# Patient Record
Sex: Male | Born: 1955 | Race: White | Hispanic: No | Marital: Married | Smoking: Current every day smoker
Health system: Southern US, Community
[De-identification: ages and names within clinical notes are randomized; demographics above are authoritative.]

## PROBLEM LIST (undated history)

## (undated) DIAGNOSIS — L409 Psoriasis, unspecified: Secondary | ICD-10-CM

## (undated) DIAGNOSIS — E785 Hyperlipidemia, unspecified: Secondary | ICD-10-CM

## (undated) DIAGNOSIS — M5126 Other intervertebral disc displacement, lumbar region: Secondary | ICD-10-CM

## (undated) DIAGNOSIS — K219 Gastro-esophageal reflux disease without esophagitis: Secondary | ICD-10-CM

## (undated) DIAGNOSIS — I10 Essential (primary) hypertension: Secondary | ICD-10-CM

## (undated) DIAGNOSIS — R7301 Impaired fasting glucose: Secondary | ICD-10-CM

## (undated) DIAGNOSIS — M5030 Other cervical disc degeneration, unspecified cervical region: Secondary | ICD-10-CM

## (undated) DIAGNOSIS — T7840XA Allergy, unspecified, initial encounter: Secondary | ICD-10-CM

## (undated) HISTORY — DX: Gastro-esophageal reflux disease without esophagitis: K21.9

## (undated) HISTORY — PX: HERNIA REPAIR: SHX51

## (undated) HISTORY — PX: ACHILLES TENDON REPAIR: SUR1153

## (undated) HISTORY — DX: Essential (primary) hypertension: I10

## (undated) HISTORY — PX: NOSE SURGERY: SHX723

## (undated) HISTORY — DX: Allergy, unspecified, initial encounter: T78.40XA

## (undated) HISTORY — DX: Other cervical disc degeneration, unspecified cervical region: M50.30

## (undated) HISTORY — DX: Other intervertebral disc displacement, lumbar region: M51.26

## (undated) HISTORY — DX: Hyperlipidemia, unspecified: E78.5

## (undated) HISTORY — DX: Impaired fasting glucose: R73.01

## (undated) HISTORY — DX: Psoriasis, unspecified: L40.9

---

## 2000-07-25 ENCOUNTER — Encounter: Admission: RE | Admit: 2000-07-25 | Discharge: 2000-07-25 | Payer: Self-pay | Admitting: Neurosurgery

## 2000-07-25 ENCOUNTER — Encounter: Payer: Self-pay | Admitting: Neurosurgery

## 2000-08-08 ENCOUNTER — Ambulatory Visit (HOSPITAL_COMMUNITY): Admission: RE | Admit: 2000-08-08 | Discharge: 2000-08-08 | Payer: Self-pay | Admitting: Neurosurgery

## 2000-08-08 ENCOUNTER — Encounter: Payer: Self-pay | Admitting: Neurosurgery

## 2002-09-05 ENCOUNTER — Ambulatory Visit (HOSPITAL_BASED_OUTPATIENT_CLINIC_OR_DEPARTMENT_OTHER): Admission: RE | Admit: 2002-09-05 | Discharge: 2002-09-05 | Payer: Self-pay | Admitting: Family Medicine

## 2002-11-13 ENCOUNTER — Encounter: Payer: Self-pay | Admitting: General Surgery

## 2002-11-13 ENCOUNTER — Encounter: Admission: RE | Admit: 2002-11-13 | Discharge: 2002-11-13 | Payer: Self-pay | Admitting: General Surgery

## 2003-06-01 ENCOUNTER — Ambulatory Visit (HOSPITAL_COMMUNITY): Admission: RE | Admit: 2003-06-01 | Discharge: 2003-06-01 | Payer: Self-pay | Admitting: Family Medicine

## 2004-10-13 ENCOUNTER — Other Ambulatory Visit: Admission: RE | Admit: 2004-10-13 | Discharge: 2004-10-13 | Payer: Self-pay | Admitting: Dermatology

## 2005-08-11 ENCOUNTER — Inpatient Hospital Stay (HOSPITAL_COMMUNITY): Admission: RE | Admit: 2005-08-11 | Discharge: 2005-08-14 | Payer: Self-pay | Admitting: Family Medicine

## 2006-06-25 ENCOUNTER — Ambulatory Visit (HOSPITAL_COMMUNITY): Admission: RE | Admit: 2006-06-25 | Discharge: 2006-06-25 | Payer: Self-pay | Admitting: Internal Medicine

## 2006-06-25 ENCOUNTER — Ambulatory Visit: Payer: Self-pay | Admitting: Internal Medicine

## 2006-06-25 HISTORY — PX: COLONOSCOPY: SHX174

## 2013-01-14 ENCOUNTER — Ambulatory Visit (INDEPENDENT_AMBULATORY_CARE_PROVIDER_SITE_OTHER): Payer: 59 | Admitting: Family Medicine

## 2013-01-14 ENCOUNTER — Encounter: Payer: Self-pay | Admitting: Family Medicine

## 2013-01-14 VITALS — BP 130/88 | HR 70 | Wt 170.0 lb

## 2013-01-14 DIAGNOSIS — G5711 Meralgia paresthetica, right lower limb: Secondary | ICD-10-CM

## 2013-01-14 DIAGNOSIS — R300 Dysuria: Secondary | ICD-10-CM

## 2013-01-14 DIAGNOSIS — G5710 Meralgia paresthetica, unspecified lower limb: Secondary | ICD-10-CM

## 2013-01-14 DIAGNOSIS — M549 Dorsalgia, unspecified: Secondary | ICD-10-CM

## 2013-01-14 MED ORDER — ETODOLAC 400 MG PO TABS: 400.0000 mg | ORAL_TABLET | Freq: Two times a day (BID) | ORAL | Status: DC

## 2013-01-14 MED ORDER — CYCLOBENZAPRINE HCL 10 MG PO TABS: 10.0000 mg | ORAL_TABLET | Freq: Three times a day (TID) | ORAL | Status: DC | PRN

## 2013-01-14 NOTE — Patient Instructions (Signed)
Meralgia Paresthetica  Meralgia paresthetica (MP) is a disorder characterized by tingling, numbness, and burning pain in the outer side of the thigh. It occurs in men more than women. MP is generally found in middle-aged or overweight people. Sometimes, the disorder may disappear. CAUSES The disorder is caused by a nerve in the thigh being squeezed (compressed). MP may be associated with tight clothing, pregnancy, diabetes, and being overweight (obese). SYMPTOMS  Tingling, numbness, and burning in the outer thigh.  An area of the skin may be painful and sensitive to the touch. The symptoms often worsen after walking or standing. TREATMENT  Treatment is based on your symptoms and is mainly supportive. Treatment may include:  Wearing looser clothing.  Losing weight.  Avoiding prolonged standing or walking.  Taking medication.  Surgery if the pain is peristent or severe. MP usually eases or disappears after treatment. Surgery is not always fully successful. Document Released: 06/23/2002 Document Revised: 09/25/2011 Document Reviewed: 07/03/2005 [employer] Patient Information 2014 [employer].  

## 2013-01-14 NOTE — Progress Notes (Signed)
  Subjective:    Patient ID: SADRAC ZEOLI, male    DOB: 02/19/1956, 57 y.o.   MRN: [id number]  Back Pain This is a new problem. The current episode started in the past 7 days. The problem has been gradually worsening since onset. The pain is present in the lumbar spine. The quality of the pain is described as aching.   Patient's spouse wants MRI and neurosurgery referral right off the bat Neg change in bowels or urine. Low back horse liniment.  Also notes numbness right anterior leg. On further history he has been putting the and and the shovel into his right groin when taking.  Review of Systems  Musculoskeletal: Positive for back pain.   No change in urine or bowel habits. No fever no abdominal pain. ROS otherwise negative.    Objective:   Physical Exam  Alert significant distress. Vitals reviewed. Lungs clear. Heart regular rate and rhythm. Negative straight leg raise. Right lower back tender to palpation. Right anterior thigh diminished sensation      Assessment & Plan:  In impression #1 acute lumbar strain discussed at great length. #2 meralgia paresthetica discussed at great length. Plan hydrocodone when necessary for pain. Flexeril 10 3 times a day. Lodine 400 twice a day. Followup in one week. Easily 25 minutes spent mostly in discussion. WSL recheck in one week.

## 2013-01-15 DIAGNOSIS — M549 Dorsalgia, unspecified: Secondary | ICD-10-CM | POA: Insufficient documentation

## 2013-01-15 DIAGNOSIS — G5711 Meralgia paresthetica, right lower limb: Secondary | ICD-10-CM | POA: Insufficient documentation

## 2013-01-16 ENCOUNTER — Ambulatory Visit: Payer: Self-pay | Admitting: Family Medicine

## 2013-01-20 ENCOUNTER — Encounter: Payer: Self-pay | Admitting: *Deleted

## 2013-01-22 ENCOUNTER — Encounter: Payer: Self-pay | Admitting: Family Medicine

## 2013-01-22 ENCOUNTER — Ambulatory Visit (INDEPENDENT_AMBULATORY_CARE_PROVIDER_SITE_OTHER): Payer: 59 | Admitting: Family Medicine

## 2013-01-22 VITALS — BP 122/86 | HR 80 | Wt 171.5 lb

## 2013-01-22 DIAGNOSIS — G5710 Meralgia paresthetica, unspecified lower limb: Secondary | ICD-10-CM

## 2013-01-22 DIAGNOSIS — G5711 Meralgia paresthetica, right lower limb: Secondary | ICD-10-CM

## 2013-01-22 DIAGNOSIS — M549 Dorsalgia, unspecified: Secondary | ICD-10-CM

## 2013-01-22 MED ORDER — ETODOLAC 400 MG PO TABS: 400.0000 mg | ORAL_TABLET | Freq: Two times a day (BID) | ORAL | Status: DC

## 2013-01-22 NOTE — Progress Notes (Signed)
  Subjective:    Patient ID: Billy Mccormick, male    DOB: Mar 14, 1956, 57 y.o.   MRN: [id number]  Back Pain This is a new problem. The current episode started in the past 7 days. The problem occurs 2 to 4 times per day. The problem has been resolved since onset. The pain is present in the lumbar spine. The quality of the pain is described as shooting. The pain radiates to the left knee. The pain is at a severity of 5/10. The pain is moderate. The pain is worse during the day. The symptoms are aggravated by bending. Stiffness is present in the morning. Pertinent negatives include no abdominal pain.   patient presents for an extensive discussion regarding his slow but definite recovery. Right posterior back pain is much improved. Right groin pain is somewhat improved. Right chronic spermatic cord tenderness post hernia surgery persists. Right anterior thigh numbness persists. And now numbness feels "deeper" patient has questionable weakness in the right leg but on further history he admits that could well be limitation due to pain.    Review of Systems  Gastrointestinal: Negative for abdominal pain.  Musculoskeletal: Positive for back pain.   ROS otherwise negative    Objective:   Physical Exam   alert no acute distress. Lungs clear. Heart regular in rhythm. Spine nontender. Right posterior lumbar tenderness much improved. Right groin tenderness persists though improved. No hernia. Anterior thigh not. Strength sensation deep tendon reflexes elsewhere all within normal limits      Assessment & Plan:  Impression 1 back strain. #2 neuralgia paresthetica. #3 groin inflammation. #4 chronic pain post hernia surgery. Plan maintain all meds. Expect slow resolution. Many many questions answered. 25 minutes spent most in discussion. WSL

## 2013-07-17 DIAGNOSIS — M5126 Other intervertebral disc displacement, lumbar region: Secondary | ICD-10-CM

## 2013-07-17 HISTORY — DX: Other intervertebral disc displacement, lumbar region: M51.26

## 2013-10-13 ENCOUNTER — Encounter: Payer: Self-pay | Admitting: Family Medicine

## 2013-10-13 ENCOUNTER — Ambulatory Visit (INDEPENDENT_AMBULATORY_CARE_PROVIDER_SITE_OTHER): Payer: 59 | Admitting: Family Medicine

## 2013-10-13 VITALS — BP 120/90 | Temp 98.4°F | Ht 73.0 in | Wt 184.0 lb

## 2013-10-13 DIAGNOSIS — R1310 Dysphagia, unspecified: Secondary | ICD-10-CM

## 2013-10-13 MED ORDER — PANTOPRAZOLE SODIUM 40 MG PO TBEC: 40.0000 mg | DELAYED_RELEASE_TABLET | Freq: Every day | ORAL | Status: DC

## 2013-10-13 NOTE — Progress Notes (Signed)
   Subjective:    Patient ID: Judie Petit, male    DOB: 1956-01-26, 57 y.o.   MRN: [id number]  Sore Throat  This is a new problem. The current episode started more than 1 year ago. The problem has been gradually worsening. Neither side of throat is experiencing more pain than the other. There has been no fever. The pain is at a severity of 8/10. The pain is moderate. Associated symptoms include trouble swallowing. He has tried nothing for the symptoms. The treatment provided no relief.  Patient states he has no other concerns at this time.   Pt notes a sens mid chexst worsened by "temp and texture and volume""  Worse the past few months, took famotidine Helped felt better off and on and then stopped  Off now past ten days  Some burping and belching, Food not getting stuck, no vomiting, occas ibupofen  Has cut out v8    Cut down to three quraters to one ppd,[phone]  Pain does not awaken  Not with exercise, question cold air not dsure  Review of Systems  HENT: Positive for trouble swallowing.    No headache no chest pain no back pain no change in bowel habits no blood in stool ROS otherwise negative    Objective:   Physical Exam Alert no acute distress. Lungs clear. Heart regular in rhythm. HEENT normal pharynx normal neck supple abdomen benign.       Assessment & Plan:  Impression probable flare with reflux however an element of his history is consistent dysphagia. Therefore GI referral warranted. Discussed. Plan initiate proton pump inhibitor. GI referral.

## 2013-10-16 ENCOUNTER — Encounter: Payer: Self-pay | Admitting: Internal Medicine

## 2013-11-11 ENCOUNTER — Ambulatory Visit (INDEPENDENT_AMBULATORY_CARE_PROVIDER_SITE_OTHER): Payer: 59 | Admitting: Gastroenterology

## 2013-11-11 ENCOUNTER — Encounter: Payer: Self-pay | Admitting: Gastroenterology

## 2013-11-11 ENCOUNTER — Other Ambulatory Visit: Payer: Self-pay | Admitting: Internal Medicine

## 2013-11-11 VITALS — BP 140/86 | HR 67 | Temp 98.4°F | Ht 73.0 in | Wt 180.0 lb

## 2013-11-11 DIAGNOSIS — R1310 Dysphagia, unspecified: Secondary | ICD-10-CM

## 2013-11-11 DIAGNOSIS — R1013 Epigastric pain: Secondary | ICD-10-CM

## 2013-11-11 DIAGNOSIS — R1319 Other dysphagia: Secondary | ICD-10-CM | POA: Insufficient documentation

## 2013-11-11 DIAGNOSIS — R1314 Dysphagia, pharyngoesophageal phase: Secondary | ICD-10-CM

## 2013-11-11 NOTE — Progress Notes (Signed)
Primary Care Physician:  Rubbie Battiest, MD  Primary Gastroenterologist:  Garfield Cornea, MD   Chief Complaint  Patient presents with  . Dysphagia    HPI:  Billy Mccormick is a 58 y.o. male here for further evaluation of esophageal dysphagia. Patient reports symptoms began about 2 years ago but was intermittent initially. Symptoms have progressively worsened and become severe back in January. Saw PCP and was started on pantoprazole about a month ago. Complains of food getting lodged the epigastric area and associated with pain. Happened regularly with large boluses, rough foods, hot foods. Symptoms have improved with decreased cigarette smoking, cutting back on caffeine and carbonated beverages, addition of PPI. Never really had typical heartburn symptoms. No prior EGD.  BM normal. No black, brbpr.   Current Outpatient Prescriptions  Medication Sig Dispense Refill  . pantoprazole (PROTONIX) 40 MG tablet Take 1 tablet (40 mg total) by mouth daily.  30 tablet  5   No current facility-administered medications for this visit.    Allergies as of 11/11/2013 - Review Complete 11/11/2013  Allergen Reaction Noted  . Prednisone  01/14/2013    Past Medical History  Diagnosis Date  . Psoriasis   . Degenerative cervical disc   . Hypertension   . Hyperlipidemia   . IFG (impaired fasting glucose)     Past Surgical History  Procedure Laterality Date  . Nose surgery      x 2  . Hernia repair      x 2  . Colonoscopy  06/25/2006    Dr. Gala Romney: extensive left-sided diverticula, 2 six to seven mm pale-appearing hyperplastic-appearing polypoid lesions vs atypcial appearing lipoma. (bx-hyperplastic polyps). next TCS 06/2016  . Achilles tendon repair      right    Family History  Problem Relation Age of Onset  . Colon cancer Neg Hx     History   Social History  . Marital Status: Married    Spouse Name: N/A    Number of Children: 0  . Years of Education: N/A   Occupational History  . Not  on file.   Social History Main Topics  . Smoking status: Current Every Day Smoker  . Smokeless tobacco: Not on file  . Alcohol Use: Yes     Comment: 6 beers about 3 days weekly  . Drug Use: No  . Sexual Activity: Not on file   Other Topics Concern  . Not on file   Social History Narrative  . No narrative on file      ROS:  General: Negative for anorexia, weight loss, fever, chills, fatigue, weakness. Eyes: Negative for vision changes.  ENT: Negative for hoarseness, difficulty swallowing , nasal congestion. CV: Negative for chest pain, angina, palpitations, dyspnea on exertion, peripheral edema.  Respiratory: Negative for dyspnea at rest, dyspnea on exertion, cough, sputum, wheezing.  GI: See history of present illness. GU:  Negative for dysuria, hematuria, urinary incontinence, urinary frequency, nocturnal urination.  MS: Negative for joint pain, low back pain.  Derm: Negative for rash or itching.  Neuro: Negative for weakness, abnormal sensation, seizure, frequent headaches, memory loss, confusion.  Psych: Negative for anxiety, depression, suicidal ideation, hallucinations.  Endo: Negative for unusual weight change.  Heme: Negative for bruising or bleeding. Allergy: Negative for rash or hives.    Physical Examination:  BP 140/86  Pulse 67  Temp(Src) 98.4 F (36.9 C) (Oral)  Ht 6\' 1"  (1.854 m)  Wt 180 lb (81.647 kg)  BMI 23.75 kg/m2  General: Well-nourished, well-developed in no acute distress.  Head: Normocephalic, atraumatic.   Eyes: Conjunctiva pink, no icterus. Mouth: Oropharyngeal mucosa moist and pink , no lesions erythema or exudate. Neck: Supple without thyromegaly, masses, or lymphadenopathy.  Lungs: Clear to auscultation bilaterally.  Heart: Regular rate and rhythm, no murmurs rubs or gallops.  Abdomen: Bowel sounds are normal, nontender, nondistended, no hepatosplenomegaly or masses, no abdominal bruits or    hernia , no rebound or guarding.   Rectal:  Not performed Extremities: No lower extremity edema. No clubbing or deformities.  Neuro: Alert and oriented x 4 , grossly normal neurologically.  Skin: Warm and dry, no rash or jaundice.   Psych: Alert and cooperative, normal mood and affect.

## 2013-11-11 NOTE — Assessment & Plan Note (Signed)
58 year old gentleman, chronic smoker and moderate alcohol consumption, who presents with two-year history of intermittent but progressive esophageal dysphagia. Symptoms associated with epigastric discomfort when this occurs. Symptoms have improved with recent PPI therapy. Differential diagnosis includes esophageal stricture, reflux esophagitis, malignancy. We have offered an upper endoscopy with esophageal dilation with Dr. Gala Romney in the near future. Given moderate alcohol consumption, will augment conscious sedation with Phenergan 25 mg IV 30 minutes before the procedure.  I have discussed the risks, alternatives, benefits with regards to but not limited to the risk of reaction to medication, bleeding, infection, perforation and the patient is agreeable to proceed. Written consent to be obtained.  Continue PPI therapy for now.

## 2013-11-11 NOTE — Patient Instructions (Signed)
1. Upper endoscopy with Dr. Rourk as scheduled. Please see separate instructions. 

## 2013-11-11 NOTE — Progress Notes (Signed)
Cc'd to pcp 

## 2013-11-14 ENCOUNTER — Encounter (HOSPITAL_COMMUNITY): Payer: Self-pay | Admitting: Pharmacy Technician

## 2013-11-28 ENCOUNTER — Ambulatory Visit (HOSPITAL_COMMUNITY)
Admission: RE | Admit: 2013-11-28 | Discharge: 2013-11-28 | Disposition: A | Discharge status: Home / Self Care | Payer: 59 | Source: Ambulatory Visit | Attending: Internal Medicine | Admitting: Internal Medicine

## 2013-11-28 ENCOUNTER — Encounter (HOSPITAL_COMMUNITY): Payer: Self-pay | Admitting: *Deleted

## 2013-11-28 ENCOUNTER — Encounter (HOSPITAL_COMMUNITY)
Admission: RE | Disposition: A | Discharge status: Home / Self Care | Payer: Self-pay | Source: Ambulatory Visit | Attending: Internal Medicine

## 2013-11-28 DIAGNOSIS — Z888 Allergy status to other drugs, medicaments and biological substances status: Secondary | ICD-10-CM | POA: Insufficient documentation

## 2013-11-28 DIAGNOSIS — R1310 Dysphagia, unspecified: Secondary | ICD-10-CM

## 2013-11-28 DIAGNOSIS — Q391 Atresia of esophagus with tracheo-esophageal fistula: Secondary | ICD-10-CM

## 2013-11-28 DIAGNOSIS — R1319 Other dysphagia: Secondary | ICD-10-CM

## 2013-11-28 DIAGNOSIS — R1013 Epigastric pain: Secondary | ICD-10-CM

## 2013-11-28 DIAGNOSIS — L408 Other psoriasis: Secondary | ICD-10-CM | POA: Insufficient documentation

## 2013-11-28 DIAGNOSIS — R1314 Dysphagia, pharyngoesophageal phase: Secondary | ICD-10-CM

## 2013-11-28 DIAGNOSIS — Q393 Congenital stenosis and stricture of esophagus: Secondary | ICD-10-CM

## 2013-11-28 DIAGNOSIS — K449 Diaphragmatic hernia without obstruction or gangrene: Secondary | ICD-10-CM | POA: Insufficient documentation

## 2013-11-28 DIAGNOSIS — I10 Essential (primary) hypertension: Secondary | ICD-10-CM | POA: Insufficient documentation

## 2013-11-28 DIAGNOSIS — E785 Hyperlipidemia, unspecified: Secondary | ICD-10-CM | POA: Insufficient documentation

## 2013-11-28 DIAGNOSIS — K259 Gastric ulcer, unspecified as acute or chronic, without hemorrhage or perforation: Secondary | ICD-10-CM | POA: Insufficient documentation

## 2013-11-28 DIAGNOSIS — Z79899 Other long term (current) drug therapy: Secondary | ICD-10-CM | POA: Insufficient documentation

## 2013-11-28 DIAGNOSIS — M5030 Other cervical disc degeneration, unspecified cervical region: Secondary | ICD-10-CM | POA: Insufficient documentation

## 2013-11-28 DIAGNOSIS — F17200 Nicotine dependence, unspecified, uncomplicated: Secondary | ICD-10-CM | POA: Insufficient documentation

## 2013-11-28 DIAGNOSIS — K222 Esophageal obstruction: Secondary | ICD-10-CM

## 2013-11-28 HISTORY — PX: SAVORY DILATION: SHX5439

## 2013-11-28 HISTORY — PX: MALONEY DILATION: SHX5535

## 2013-11-28 HISTORY — PX: ESOPHAGOGASTRODUODENOSCOPY: SHX5428

## 2013-11-28 SURGERY — EGD (ESOPHAGOGASTRODUODENOSCOPY)
Anesthesia: Moderate Sedation

## 2013-11-28 MED ORDER — SODIUM CHLORIDE 0.9 % IJ SOLN
INTRAMUSCULAR | Status: AC
  Filled 2013-11-28: qty 10

## 2013-11-28 MED ORDER — LIDOCAINE VISCOUS 2 % MT SOLN
OROMUCOSAL | Status: DC | PRN
  Administered 2013-11-28: 4 mL via OROMUCOSAL

## 2013-11-28 MED ORDER — MIDAZOLAM HCL 5 MG/5ML IJ SOLN
INTRAMUSCULAR | Status: AC
  Filled 2013-11-28: qty 10

## 2013-11-28 MED ORDER — MEPERIDINE HCL 100 MG/ML IJ SOLN
INTRAMUSCULAR | Status: AC
  Filled 2013-11-28: qty 2

## 2013-11-28 MED ORDER — ONDANSETRON HCL 4 MG/2ML IJ SOLN
INTRAMUSCULAR | Status: DC | PRN
  Administered 2013-11-28: 4 mg via INTRAVENOUS

## 2013-11-28 MED ORDER — MEPERIDINE HCL 100 MG/ML IJ SOLN
INTRAMUSCULAR | Status: DC | PRN
  Administered 2013-11-28: 50 mg

## 2013-11-28 MED ORDER — ONDANSETRON HCL 4 MG/2ML IJ SOLN
INTRAMUSCULAR | Status: AC
  Filled 2013-11-28: qty 2

## 2013-11-28 MED ORDER — SODIUM CHLORIDE 0.9 % IV SOLN
INTRAVENOUS | Status: DC
  Administered 2013-11-28: 09:00:00 via INTRAVENOUS

## 2013-11-28 MED ORDER — MIDAZOLAM HCL 5 MG/5ML IJ SOLN
INTRAMUSCULAR | Status: DC | PRN
  Administered 2013-11-28: 1 mg via INTRAVENOUS
  Administered 2013-11-28: 2 mg via INTRAVENOUS

## 2013-11-28 MED ORDER — LIDOCAINE VISCOUS 2 % MT SOLN
OROMUCOSAL | Status: AC
  Filled 2013-11-28: qty 15

## 2013-11-28 MED ORDER — PROMETHAZINE HCL 25 MG/ML IJ SOLN
INTRAMUSCULAR | Status: AC
  Filled 2013-11-28: qty 1

## 2013-11-28 MED ORDER — PROMETHAZINE HCL 25 MG/ML IJ SOLN
25.0000 mg | Freq: Once | INTRAMUSCULAR | Status: AC
  Administered 2013-11-28: 25 mg via INTRAVENOUS

## 2013-11-28 MED ORDER — SIMETHICONE 40 MG/0.6ML PO SUSP
ORAL | Status: DC | PRN
  Administered 2013-11-28: 10:00:00

## 2013-11-28 NOTE — Op Note (Signed)
[facility] [address] [place], [address]   ENDOSCOPY PROCEDURE REPORT  PATIENT: Billy Mccormick, Billy Mccormick  MR#: [id number] BIRTHDATE: 10-11-1955 , 59  yrs. old GENDER: Male ENDOSCOPIST: R.  Garfield Cornea, MD FACP FACG REFERRED BY:  Rosemary Holms, M.D. PROCEDURE DATE:  11/28/2013 PROCEDURE:     EGD with gastric biopsy  INDICATIONS:     Recent lower chest pain with swallowing/odynophagia. Discussed symptoms in  preop with patient and wife. He denies true esophageal dysphagia. These symptoms have completely resolved on Protonix 40 mg daily.  INFORMED CONSENT:   The risks, benefits, limitations, alternatives and imponderables have been discussed.  The potential for biopsy, esophogeal dilation, etc. have also been reviewed.  Questions have been answered.  All parties agreeable.  Please see the history and physical in the medical record for more information.  MEDICATIONS:     Versed 3 mg IV and Demerol 50 mg IV in divided doses. Phenergan 25 mg IV. Zofran 4 mg IV. Xylocaine gel orally.  DESCRIPTION OF PROCEDURE:   The EG-[id number] ([id number])  endoscope was introduced through the mouth and advanced to the second portion of the duodenum without difficulty or limitations.  The mucosal surfaces were surveyed very carefully during advancement of the scope and upon withdrawal.  Retroflexion view of the proximal stomach and esophagogastric junction was performed.      FINDINGS: Non-critical Schatzki's ring; otherwise normal esophagus. Stomach empty. Smalll hiatal hernia. (1)  4 mm ulcer on the angularis; multiple areas of antral erosion with some minimally heaped up mucosa with central eroded mucosa. No obvious infiltrating process Mr. Patent pylorus. Normal first and second portion of the duodenum.  THERAPEUTIC / DIAGNOSTIC MANEUVERS PERFORMED:  Biopsies of the angularis ulcer and antral erosions were taken separately. No dilation performed because no  dysphagia.   COMPLICATIONS:  None  IMPRESSION:  Noncritical Schatzki's ring  -  not manipulated. Small hiatal hernia. Gastric ulcer and erosions as described above   - status post biopsy  RECOMMENDATIONS:  Increase Prtonix to 40 mg twice daily. minimize use of alcohol and tobacco. Avoid nonsteroidal agents. Further recommendations to follow.    _______________________________ R. Garfield Cornea, MD FACP [facility] eSigned:  R. Garfield Cornea, MD FACP [facility] 11/28/2013 10:23 AM     CC:  PATIENT NAME:  Billy Mccormick, Billy Mccormick MR#: [id number]

## 2013-11-28 NOTE — Interval H&P Note (Signed)
History and Physical Interval Note:  11/28/2013 9:57 AM  Billy Mccormick  has presented today for surgery, with the diagnosis of ESOPHAGEAL DYSPHAGIA AND EPIGASTRIC PAIN  The various methods of treatment have been discussed with the patient and family. After consideration of risks, benefits and other options for treatment, the patient has consented to  Procedure(s) with comments: ESOPHAGOGASTRODUODENOSCOPY (EGD) (N/A) - 9:30 SAVORY DILATION (N/A) MALONEY DILATION (N/A) as a surgical intervention .  The patient's history has been reviewed, patient examined, no change in status, stable for surgery.  I have reviewed the patient's chart and labs.  Questions were answered to the patient's satisfaction.     Cristopher Estimable Chalmers Iddings  His upper GI tract symptoms have resolved on once daily Protonix; EGD per plan;  The risks, benefits, limitations, alternatives and imponderables have been reviewed with the patient. Potential for esophageal dilation, biopsy, etc. have also been reviewed.  Questions have been answered. All parties agreeable.

## 2013-11-28 NOTE — H&P (View-Only) (Signed)
Primary Care Physician:  Rubbie Battiest, MD  Primary Gastroenterologist:  Garfield Cornea, MD   Chief Complaint  Patient presents with  . Dysphagia    HPI:  Billy Mccormick is a 58 y.o. male here for further evaluation of esophageal dysphagia. Patient reports symptoms began about 2 years ago but was intermittent initially. Symptoms have progressively worsened and become severe back in January. Saw PCP and was started on pantoprazole about a month ago. Complains of food getting lodged the epigastric area and associated with pain. Happened regularly with large boluses, rough foods, hot foods. Symptoms have improved with decreased cigarette smoking, cutting back on caffeine and carbonated beverages, addition of PPI. Never really had typical heartburn symptoms. No prior EGD.  BM normal. No black, brbpr.   Current Outpatient Prescriptions  Medication Sig Dispense Refill  . pantoprazole (PROTONIX) 40 MG tablet Take 1 tablet (40 mg total) by mouth daily.  30 tablet  5   No current facility-administered medications for this visit.    Allergies as of 11/11/2013 - Review Complete 11/11/2013  Allergen Reaction Noted  . Prednisone  01/14/2013    Past Medical History  Diagnosis Date  . Psoriasis   . Degenerative cervical disc   . Hypertension   . Hyperlipidemia   . IFG (impaired fasting glucose)     Past Surgical History  Procedure Laterality Date  . Nose surgery      x 2  . Hernia repair      x 2  . Colonoscopy  06/25/2006    Dr. Gala Romney: extensive left-sided diverticula, 2 six to seven mm pale-appearing hyperplastic-appearing polypoid lesions vs atypcial appearing lipoma. (bx-hyperplastic polyps). next TCS 06/2016  . Achilles tendon repair      right    Family History  Problem Relation Age of Onset  . Colon cancer Neg Hx     History   Social History  . Marital Status: Married    Spouse Name: N/A    Number of Children: 0  . Years of Education: N/A   Occupational History  . Not  on file.   Social History Main Topics  . Smoking status: Current Every Day Smoker  . Smokeless tobacco: Not on file  . Alcohol Use: Yes     Comment: 6 beers about 3 days weekly  . Drug Use: No  . Sexual Activity: Not on file   Other Topics Concern  . Not on file   Social History Narrative  . No narrative on file      ROS:  General: Negative for anorexia, weight loss, fever, chills, fatigue, weakness. Eyes: Negative for vision changes.  ENT: Negative for hoarseness, difficulty swallowing , nasal congestion. CV: Negative for chest pain, angina, palpitations, dyspnea on exertion, peripheral edema.  Respiratory: Negative for dyspnea at rest, dyspnea on exertion, cough, sputum, wheezing.  GI: See history of present illness. GU:  Negative for dysuria, hematuria, urinary incontinence, urinary frequency, nocturnal urination.  MS: Negative for joint pain, low back pain.  Derm: Negative for rash or itching.  Neuro: Negative for weakness, abnormal sensation, seizure, frequent headaches, memory loss, confusion.  Psych: Negative for anxiety, depression, suicidal ideation, hallucinations.  Endo: Negative for unusual weight change.  Heme: Negative for bruising or bleeding. Allergy: Negative for rash or hives.    Physical Examination:  BP 140/86  Pulse 67  Temp(Src) 98.4 F (36.9 C) (Oral)  Ht 6\' 1"  (1.854 m)  Wt 180 lb (81.647 kg)  BMI 23.75 kg/m2  General: Well-nourished, well-developed in no acute distress.  Head: Normocephalic, atraumatic.   Eyes: Conjunctiva pink, no icterus. Mouth: Oropharyngeal mucosa moist and pink , no lesions erythema or exudate. Neck: Supple without thyromegaly, masses, or lymphadenopathy.  Lungs: Clear to auscultation bilaterally.  Heart: Regular rate and rhythm, no murmurs rubs or gallops.  Abdomen: Bowel sounds are normal, nontender, nondistended, no hepatosplenomegaly or masses, no abdominal bruits or    hernia , no rebound or guarding.   Rectal:  Not performed Extremities: No lower extremity edema. No clubbing or deformities.  Neuro: Alert and oriented x 4 , grossly normal neurologically.  Skin: Warm and dry, no rash or jaundice.   Psych: Alert and cooperative, normal mood and affect.

## 2013-11-28 NOTE — Discharge Instructions (Addendum)
EGD Discharge instructions Please read the instructions outlined below and refer to this sheet in the next few weeks. These discharge instructions provide you with general information on caring for yourself after you leave the hospital. Your doctor may also give you specific instructions. While your treatment has been planned according to the most current medical practices available, unavoidable complications occasionally occur. If you have any problems or questions after discharge, please call your doctor. ACTIVITY  You may resume your regular activity but move at a slower pace for the next 24 hours.   Take frequent rest periods for the next 24 hours.   Walking will help expel (get rid of) the air and reduce the bloated feeling in your abdomen.   No driving for 24 hours (because of the anesthesia (medicine) used during the test).   You may shower.   Do not sign any important legal documents or operate any machinery for 24 hours (because of the anesthesia used during the test).  NUTRITION  Drink plenty of fluids.   You may resume your normal diet.   Begin with a light meal and progress to your normal diet.   Avoid alcoholic beverages for 24 hours or as instructed by your caregiver.  MEDICATIONS  You may resume your normal medications unless your caregiver tells you otherwise.  WHAT YOU CAN EXPECT TODAY  You may experience abdominal discomfort such as a feeling of fullness or gas pains.  FOLLOW-UP  Your doctor will discuss the results of your test with you.  SEEK IMMEDIATE MEDICAL ATTENTION IF ANY OF THE FOLLOWING OCCUR:  Excessive nausea (feeling sick to your stomach) and/or vomiting.   Severe abdominal pain and distention (swelling).   Trouble swallowing.   Temperature over 101 F (37.8 C).    Increase Protonix to 40 mg twice daily  Avoid nonsteroidal agents like ibuprofen and Motrin  Minimize use of alcohol and tobacco  Office visit with [place] in 3  months  Further recommendations to follow pending review of pathology report  Rectal bleeding or vomiting of blood.   Peptic Ulcer A peptic ulcer is a sore in the lining of in your esophagus (esophageal ulcer), stomach (gastric ulcer), or in the first part of your small intestine (duodenal ulcer). The ulcer causes erosion into the deeper tissue. CAUSES  Normally, the lining of the stomach and the small intestine protects itself from the acid that digests food. The protective lining can be damaged by:  An infection caused by a bacterium called Helicobacter pylori (H. pylori).  Regular use of nonsteroidal anti-inflammatory drugs (NSAIDs), such as ibuprofen or aspirin.  Smoking tobacco. Other risk factors include being older than 25, drinking alcohol excessively, and having a family history of ulcer disease.  SYMPTOMS   Burning pain or gnawing in the area between the chest and the belly button.  Heartburn.  Nausea and vomiting.  Bloating. The pain can be worse on an empty stomach and at night. If the ulcer results in bleeding, it can cause:  Black, tarry stools.  Vomiting of bright red blood.  Vomiting of coffee ground looking materials. DIAGNOSIS  A diagnosis is usually made based upon your history and an exam. Other tests and procedures may be performed to find the cause of the ulcer. Finding a cause will help determine the best treatment. Tests and procedures may include:  Blood tests, stool tests, or breath tests to check for the bacterium H. pylori.  An upper gastrointestinal (GI) series of the esophagus, stomach, and  small intestine.  An endoscopy to examine the esophagus, stomach, and small intestine.  A biopsy. TREATMENT  Treatment may include:  Eliminating the cause of the ulcer, such as smoking, NSAIDs, or alcohol.  Medicines to reduce the amount of acid in your digestive tract.  Antibiotic medicines if the ulcer is caused by the H. pylori bacterium.  An  upper endoscopy to treat a bleeding ulcer.  Surgery if the bleeding is severe or if the ulcer created a hole somewhere in the digestive system. HOME CARE INSTRUCTIONS   Avoid tobacco, alcohol, and caffeine. Smoking can increase the acid in the stomach, and continued smoking will impair the healing of ulcers.  Avoid foods and drinks that seem to cause discomfort or aggravate your ulcer.  Only take medicines as directed by your caregiver. Do not substitute over-the-counter medicines for prescription medicines without talking to your caregiver.  Keep any follow-up appointments and tests as directed. SEEK MEDICAL CARE IF:   Your do not improve within 7 days of starting treatment.  You have ongoing indigestion or heartburn. SEEK IMMEDIATE MEDICAL CARE IF:   You have sudden, sharp, or persistent abdominal pain.  You have bloody or dark black, tarry stools.  You vomit blood or vomit that looks like coffee grounds.  You become light headed, weak, or feel faint.  You become sweaty or clammy. MAKE SURE YOU:   Understand these instructions.  Will watch your condition.  Will get help right away if you are not doing well or get worse.

## 2013-12-01 ENCOUNTER — Encounter (HOSPITAL_COMMUNITY): Payer: Self-pay | Admitting: Internal Medicine

## 2013-12-07 ENCOUNTER — Encounter: Payer: Self-pay | Admitting: Internal Medicine

## 2013-12-09 ENCOUNTER — Telehealth: Payer: Self-pay

## 2013-12-09 NOTE — Telephone Encounter (Signed)
Letter from: Daneil Dolin  Reason for Letter: Results Review  Send letter to patient.  Send copy of letter with path to referring provider and PCP.  Needs ov w extender in 3 months - will need a repeat  EGD

## 2013-12-09 NOTE — Telephone Encounter (Signed)
Letter mailed to pt.  

## 2013-12-10 ENCOUNTER — Other Ambulatory Visit: Payer: Self-pay | Admitting: *Deleted

## 2013-12-10 NOTE — Telephone Encounter (Signed)
Results Cc to PCP  

## 2013-12-10 NOTE — Telephone Encounter (Signed)
Reminder in epic °

## 2014-03-30 ENCOUNTER — Encounter: Payer: Self-pay | Admitting: Internal Medicine

## 2014-04-17 ENCOUNTER — Ambulatory Visit: Payer: 59 | Admitting: Gastroenterology

## 2014-04-27 ENCOUNTER — Encounter: Payer: Self-pay | Admitting: Gastroenterology

## 2014-04-27 ENCOUNTER — Telehealth: Payer: Self-pay

## 2014-04-27 ENCOUNTER — Other Ambulatory Visit: Payer: Self-pay

## 2014-04-27 ENCOUNTER — Ambulatory Visit (INDEPENDENT_AMBULATORY_CARE_PROVIDER_SITE_OTHER): Payer: 59 | Admitting: Gastroenterology

## 2014-04-27 VITALS — BP 127/86 | HR 70 | Temp 98.1°F | Ht 73.0 in | Wt 179.2 lb

## 2014-04-27 DIAGNOSIS — K259 Gastric ulcer, unspecified as acute or chronic, without hemorrhage or perforation: Secondary | ICD-10-CM

## 2014-04-27 DIAGNOSIS — K219 Gastro-esophageal reflux disease without esophagitis: Secondary | ICD-10-CM

## 2014-04-27 DIAGNOSIS — Z8719 Personal history of other diseases of the digestive system: Secondary | ICD-10-CM

## 2014-04-27 DIAGNOSIS — Z8711 Personal history of peptic ulcer disease: Secondary | ICD-10-CM

## 2014-04-27 MED ORDER — PANTOPRAZOLE SODIUM 40 MG PO TBEC: 40.0000 mg | DELAYED_RELEASE_TABLET | Freq: Two times a day (BID) | ORAL | Status: DC

## 2014-04-27 NOTE — Progress Notes (Signed)
Primary Care Physician: Rubbie Battiest, MD  Primary Gastroenterologist:  Garfield Cornea, MD   Chief Complaint  Patient presents with  . Follow-up    HPI: Billy Mccormick is a 58 y.o. male here for followup of EGD. He was seen back in April with complaints of painful swallowing related to large boluses, rough foods, hot foods. Notes that symptoms have decreased with cutting back on caffeine and carbonated beverages, addition of pantoprazole, decrease cigarette smoking. Never really had typical heartburn symptoms. On EGD he had a noncritical Schatzki ring which was not manipulated. 4 mm gastric ulcer and other multiple antral erosions were noted. Antral erosion biopsy showed extensive intestinal metaplasia. Patient's pantoprazole was increased to twice a day. He states she's been completely asymptomatic with regards to odynophagia. Denies any dysphagia. 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 Denies any blood in the stool or melena. Has a bowel movement almost every day. Denies any abdominal pain, vomiting, unintentional weight loss.  Current Outpatient Prescriptions  Medication Sig Dispense Refill  . pantoprazole (PROTONIX) 40 MG tablet Take 1 tablet (40 mg total) by mouth 2 (two) times daily before a meal.  60 tablet  5   No current facility-administered medications for this visit.    Allergies as of 04/27/2014 - Review Complete 04/27/2014  Allergen Reaction Noted  . Prednisone  01/14/2013   Past Medical History  Diagnosis Date  . Psoriasis   . Degenerative cervical disc   . Hyperlipidemia   . IFG (impaired fasting glucose)    Past Surgical History  Procedure Laterality Date  . Nose surgery      x 2  . Hernia repair      x 2  . Colonoscopy  06/25/2006    Dr. Gala Romney: extensive left-sided diverticula, 2 six to seven mm pale-appearing hyperplastic-appearing polypoid lesions vs atypcial appearing lipoma. (bx-hyperplastic polyps). next TCS 06/2016  .  Achilles tendon repair      right  . Esophagogastroduodenoscopy N/A 11/28/2013    [id number] Schatzki's ring  not manipulated. Small hiatal hernia. Gastric ulcer and erosions as above. Bx antrum-extensive intestinal metaplasia, no H.pylori  . Savory dilation N/A 11/28/2013    Procedure: SAVORY DILATION;  Surgeon: Daneil Dolin, MD;  Location: [facility];  Service: Endoscopy;  Laterality: N/A;  Venia Minks dilation N/A 11/28/2013    Procedure: Venia Minks DILATION;  Surgeon: Daneil Dolin, MD;  Location: [facility];  Service: Endoscopy;  Laterality: N/A;   Family History  Problem Relation Age of Onset  . Colon cancer Neg Hx    History   Social History  . Marital Status: Married    Spouse Name: N/A    Number of Children: 0  . Years of Education: N/A   Social History Main Topics  . Smoking status: Current Every Day Smoker  . Smokeless tobacco: None  . Alcohol Use: Yes     Comment: 6 beers about 3 days weekly  . Drug Use: No  . Sexual Activity: None   Other Topics Concern  . None   Social History Narrative  . None    ROS:  General: Negative for anorexia, weight loss, fever, chills, fatigue, weakness. ENT: Negative for hoarseness, difficulty swallowing , nasal congestion. CV: Negative for chest pain, angina, palpitations, dyspnea on exertion, peripheral edema.  Respiratory: Negative for dyspnea at rest, dyspnea on exertion, cough, sputum, wheezing.  GI: See history of present illness. GU:  Negative for dysuria, hematuria,  urinary incontinence, urinary frequency, nocturnal urination.  Endo: Negative for unusual weight change.    Physical Examination:   BP 127/86  Pulse 70  Temp(Src) 98.1 F (36.7 C) (Oral)  Ht 6\' 1"  (1.854 m)  Wt 179 lb 3.2 oz (81.285 kg)  BMI 23.65 kg/m2  General: Well-nourished, well-developed in no acute distress.  Eyes: No icterus. Mouth: Oropharyngeal mucosa moist and pink , no lesions erythema or exudate. Lungs: Clear to auscultation  bilaterally.  Heart: Regular rate and rhythm, no murmurs rubs or gallops.  Abdomen: Bowel sounds are normal, nontender, nondistended, no hepatosplenomegaly or masses, no abdominal bruits or hernia , no rebound or guarding.   Extremities: No lower extremity edema. No clubbing or deformities. Neuro: Alert and oriented x 4   Skin: Warm and dry, no jaundice.   Psych: Alert and cooperative, normal mood and affect.

## 2014-04-27 NOTE — Telephone Encounter (Signed)
Pt would like to have his EGD done on Nov 13. Would this be ok since it is one day past his 30 days. Please advise

## 2014-04-27 NOTE — Patient Instructions (Signed)
1. You should continue pantoprazole once a day until your upper endoscopy. 2. Upper endoscopy with Dr. Gala Romney. See separate instructions.

## 2014-04-27 NOTE — Telephone Encounter (Signed)
Ok thanks. Pt is aware

## 2014-04-27 NOTE — Progress Notes (Signed)
cc'ed to pcp °

## 2014-04-27 NOTE — Telephone Encounter (Signed)
Okay on this one only

## 2014-04-27 NOTE — Assessment & Plan Note (Signed)
58 year old gentleman who presents for followup of lower chest pain associated with swallowing/odynophagia. Underwent an EGD back in May and was found to have a noncritical Schatzki ring which was not manipulated. Incidentally found to have gastric ulcers and erosions. Ulcer was 4 mm. Biopsy was benign. Gastric erosions show evidence of extensive intestinal metaplasia on biopsy. No H. pylori. Patient used to use ibuprofen frequently but less than 800 mg daily. He's cut back on alcohol consumption as well. Presents back to schedule 3 month followup EGD.  I have discussed the risks, alternatives, benefits with regards to but not limited to the risk of reaction to medication, bleeding, infection, perforation and the patient is agreeable to proceed. Written consent to be obtained.  Odynophagia resolved with pantoprazole 40 mg twice a day. Denies any dysphagia. Patient desired coming back to once daily pantoprazole. Advised that he could try this as long as he remains asymptomatic.

## 2014-05-13 ENCOUNTER — Encounter (HOSPITAL_COMMUNITY): Payer: Self-pay | Admitting: Pharmacy Technician

## 2014-05-18 ENCOUNTER — Telehealth: Payer: Self-pay

## 2014-05-18 NOTE — Telephone Encounter (Signed)
LMOM to call back

## 2014-05-18 NOTE — Telephone Encounter (Signed)
If patient is insistent on a Friday, we can offer to save a Friday spot and bring him back in the office to update H+P but we need to make sure he understands that he cannot work for 24 hours after his procedure.   He has mentioned going 3rd shift after having an AM procedure but this is not acceptable.

## 2014-05-18 NOTE — Telephone Encounter (Signed)
Pt came by to find out about his appointment for his EGD. We had him set up for 05/29/14 but RMR is off that day. He needs Friday because he works third shift and not be able to drink anything. There is no Fridays until after the first of the year. Should we hold a spot for him and then have him come back in to the office. Please advise

## 2014-05-19 NOTE — Addendum Note (Signed)
Addended by: Idamae Schuller on: 05/19/2014 10:29 AM   Modules accepted: Orders

## 2014-05-19 NOTE — Addendum Note (Signed)
Addended by: Idamae Schuller on: 05/19/2014 10:33 AM   Modules accepted: Orders

## 2014-05-19 NOTE — Telephone Encounter (Signed)
Patient is scheduled for 11/10 at 7:30. He is aware and has received his instructions.

## 2014-05-19 NOTE — Addendum Note (Signed)
Addended by: Idamae Schuller on: 05/19/2014 10:27 AM   Modules accepted: Orders

## 2014-05-26 ENCOUNTER — Ambulatory Visit (HOSPITAL_COMMUNITY)
Admission: RE | Admit: 2014-05-26 | Discharge: 2014-05-26 | Disposition: A | Discharge status: Home / Self Care | Payer: 59 | Source: Ambulatory Visit | Attending: Internal Medicine | Admitting: Internal Medicine

## 2014-05-26 ENCOUNTER — Encounter (HOSPITAL_COMMUNITY): Payer: Self-pay | Admitting: *Deleted

## 2014-05-26 ENCOUNTER — Encounter (HOSPITAL_COMMUNITY)
Admission: RE | Disposition: A | Discharge status: Home / Self Care | Payer: Self-pay | Source: Ambulatory Visit | Attending: Internal Medicine

## 2014-05-26 DIAGNOSIS — K3189 Other diseases of stomach and duodenum: Secondary | ICD-10-CM | POA: Insufficient documentation

## 2014-05-26 DIAGNOSIS — F17210 Nicotine dependence, cigarettes, uncomplicated: Secondary | ICD-10-CM | POA: Insufficient documentation

## 2014-05-26 DIAGNOSIS — Z888 Allergy status to other drugs, medicaments and biological substances status: Secondary | ICD-10-CM | POA: Insufficient documentation

## 2014-05-26 DIAGNOSIS — G5712 Meralgia paresthetica, left lower limb: Secondary | ICD-10-CM | POA: Insufficient documentation

## 2014-05-26 DIAGNOSIS — K2950 Unspecified chronic gastritis without bleeding: Secondary | ICD-10-CM | POA: Diagnosis not present

## 2014-05-26 DIAGNOSIS — L409 Psoriasis, unspecified: Secondary | ICD-10-CM | POA: Insufficient documentation

## 2014-05-26 DIAGNOSIS — K222 Esophageal obstruction: Secondary | ICD-10-CM | POA: Insufficient documentation

## 2014-05-26 DIAGNOSIS — K259 Gastric ulcer, unspecified as acute or chronic, without hemorrhage or perforation: Secondary | ICD-10-CM

## 2014-05-26 DIAGNOSIS — K219 Gastro-esophageal reflux disease without esophagitis: Secondary | ICD-10-CM | POA: Diagnosis not present

## 2014-05-26 DIAGNOSIS — E785 Hyperlipidemia, unspecified: Secondary | ICD-10-CM | POA: Insufficient documentation

## 2014-05-26 HISTORY — PX: ESOPHAGOGASTRODUODENOSCOPY: SHX5428

## 2014-05-26 SURGERY — EGD (ESOPHAGOGASTRODUODENOSCOPY)
Anesthesia: Moderate Sedation

## 2014-05-26 MED ORDER — LIDOCAINE VISCOUS 2 % MT SOLN
OROMUCOSAL | Status: AC
  Filled 2014-05-26: qty 15

## 2014-05-26 MED ORDER — STERILE WATER FOR IRRIGATION IR SOLN
Status: DC | PRN
  Administered 2014-05-26: 08:00:00

## 2014-05-26 MED ORDER — SODIUM CHLORIDE 0.9 % IV SOLN
INTRAVENOUS | Status: DC
  Administered 2014-05-26: 07:00:00 via INTRAVENOUS

## 2014-05-26 MED ORDER — ONDANSETRON HCL 4 MG/2ML IJ SOLN
INTRAMUSCULAR | Status: AC
  Filled 2014-05-26: qty 2

## 2014-05-26 MED ORDER — MIDAZOLAM HCL 5 MG/5ML IJ SOLN
INTRAMUSCULAR | Status: AC
  Filled 2014-05-26: qty 10

## 2014-05-26 MED ORDER — MEPERIDINE HCL 100 MG/ML IJ SOLN
INTRAMUSCULAR | Status: AC
  Filled 2014-05-26: qty 2

## 2014-05-26 MED ORDER — MEPERIDINE HCL 100 MG/ML IJ SOLN
INTRAMUSCULAR | Status: DC | PRN
  Administered 2014-05-26: 50 mg via INTRAVENOUS

## 2014-05-26 MED ORDER — SODIUM CHLORIDE 0.9 % IJ SOLN
INTRAMUSCULAR | Status: AC
  Filled 2014-05-26: qty 10

## 2014-05-26 MED ORDER — MIDAZOLAM HCL 5 MG/5ML IJ SOLN
INTRAMUSCULAR | Status: DC | PRN
  Administered 2014-05-26: 1 mg via INTRAVENOUS
  Administered 2014-05-26: 2 mg via INTRAVENOUS

## 2014-05-26 MED ORDER — PROMETHAZINE HCL 25 MG/ML IJ SOLN
12.5000 mg | Freq: Once | INTRAMUSCULAR | Status: AC
  Administered 2014-05-26: 12.5 mg via INTRAVENOUS
  Filled 2014-05-26: qty 1

## 2014-05-26 MED ORDER — ONDANSETRON HCL 4 MG/2ML IJ SOLN
INTRAMUSCULAR | Status: DC | PRN
  Administered 2014-05-26: 4 mg via INTRAVENOUS

## 2014-05-26 MED ORDER — LIDOCAINE VISCOUS 2 % MT SOLN
OROMUCOSAL | Status: DC | PRN
  Administered 2014-05-26: 1 via OROMUCOSAL

## 2014-05-26 NOTE — Discharge Instructions (Signed)
EGD °Discharge instructions °Please read the instructions outlined below and refer to this sheet in the next few weeks. These discharge instructions provide you with general information on caring for yourself after you leave the hospital. Your doctor may also give you specific instructions. While your treatment has been planned according to the most current medical practices available, unavoidable complications occasionally occur. If you have any problems or questions after discharge, please call your doctor. °ACTIVITY °· You may resume your regular activity but move at a slower pace for the next 24 hours.  °· Take frequent rest periods for the next 24 hours.  °· Walking will help expel (get rid of) the air and reduce the bloated feeling in your abdomen.  °· No driving for 24 hours (because of the anesthesia (medicine) used during the test).  °· You may shower.  °· Do not sign any important legal documents or operate any machinery for 24 hours (because of the anesthesia used during the test).  °NUTRITION °· Drink plenty of fluids.  °· You may resume your normal diet.  °· Begin with a light meal and progress to your normal diet.  °· Avoid alcoholic beverages for 24 hours or as instructed by your caregiver.  °MEDICATIONS °· You may resume your normal medications unless your caregiver tells you otherwise.  °WHAT YOU CAN EXPECT TODAY °· You may experience abdominal discomfort such as a feeling of fullness or “gas” pains.  °FOLLOW-UP °· Your doctor will discuss the results of your test with you.  °SEEK IMMEDIATE MEDICAL ATTENTION IF ANY OF THE FOLLOWING OCCUR: °· Excessive nausea (feeling sick to your stomach) and/or vomiting.  °· Severe abdominal pain and distention (swelling).  °· Trouble swallowing.  °· Temperature over 101° F (37.8º C).  °· Rectal bleeding or vomiting of blood.  ° ° °Continue Protonix 40 mg daily ° °Further recommendations to follow pending review of pathology report °

## 2014-05-26 NOTE — Op Note (Signed)
[facility] [address] [place], [address]   ENDOSCOPY PROCEDURE REPORT  PATIENT: Billy, Mccormick  MR#: [id number] BIRTHDATE: [birth year] , 40  yrs. old GENDER: male ENDOSCOPIST: R.  Garfield Cornea, MD FACP FACG REFERRED BY:  Rosemary Holms, M.D. PROCEDURE DATE:  06-20-14 PROCEDURE:  EGD w/ biopsy INDICATIONS:  follow-up gastric ulcer. MEDICATIONS: Versed 3 mg IV and Demerol 50 mg IV in divided doses. Phenergan 12.5 mg IV and Zofran 4 mg IV.  Xylocaine gel orally ASA CLASS:      Class II  CONSENT: The risks, benefits, limitations, alternatives and imponderables have been discussed.  The potential for biopsy, esophogeal dilation, etc. have also been reviewed.  Questions have been answered.  All parties agreeable.  Please see the history and physical in the medical record for more information.  DESCRIPTION OF PROCEDURE: After the risks benefits and alternatives of the procedure were thoroughly explained, informed consent was obtained.  The EG-[id number] ([id number]) endoscope was introduced through the mouth and advanced to the second portion of the duodenum , limited by Without limitations. The instrument was slowly withdrawn as the mucosa was fully examined.    Noncritical Schatzki's ring; otherwise, normal esophagus.  Stomach empty.  Previously noted gastric ulcer completely healed.  Subtle nodularity of the antrum persists.  No ulcer infiltrating process. Patent pylorus.  Normal first and second portion of the duodenum. Biopsies of the abnormal appearing antrum, angularis and gastric body taken for histologic study.  Retroflexed views revealed no abnormalities.     The scope was then withdrawn from the patient and the procedure completed.  COMPLICATIONS: There were no immediate complications.  ENDOSCOPIC IMPRESSION: Noncritical Schatzki's ring; gastric nodularity;  Previously noted gastric ulcer completely healed.  Status post gastric  biopsy.   RECOMMENDATIONS: Continue Protonix 40 mg daily. Further recommendations to follow pending review of pathology report  REPEAT EXAM:  eSigned:  R. Garfield Cornea, MD [facility] 2014-06-20 8:11 AM    CC:  CPT CODES: ICD CODES:  The ICD and CPT codes recommended by this software are interpretations from the data that the clinical staff has captured with the software.  The verification of the translation of this report to the ICD and CPT codes and modifiers is the sole responsibility of the health care institution and practicing physician where this report was generated.  [employer] will not be held responsible for the validity of the ICD and CPT codes included on this report.  AMA assumes no liability for data contained or not contained herein. CPT is a Designer, television/film set of the [employer].  PATIENT NAME:  Billy, Mccormick MR#: [id number]

## 2014-05-26 NOTE — Interval H&P Note (Signed)
History and Physical Interval Note:  05/26/2014 7:42 AM  Billy Mccormick  has presented today for surgery, with the diagnosis of GASTRIC ULCERS  The various methods of treatment have been discussed with the patient and family. After consideration of risks, benefits and other options for treatment, the patient has consented to  Procedure(s) with comments: ESOPHAGOGASTRODUODENOSCOPY (EGD) (N/A) - 7:30AM as a surgical intervention .  The patient's history has been reviewed, patient examined, no change in status, stable for surgery.  I have reviewed the patient's chart and labs.  Questions were answered to the patient's satisfaction.     Billy Mccormick  No change. EGD per plan.The risks, benefits, limitations, alternatives and imponderables have been reviewed with the patient. Potential for esophageal dilation, biopsy, etc. have also been reviewed.  Questions have been answered. All parties agreeable.

## 2014-05-26 NOTE — H&P (View-Only) (Signed)
Primary Care Physician: Rubbie Battiest, MD  Primary Gastroenterologist:  Garfield Cornea, MD   Chief Complaint  Patient presents with  . Follow-up    HPI: Billy Mccormick is a 58 y.o. male here for followup of EGD. He was seen back in April with complaints of painful swallowing related to large boluses, rough foods, hot foods. Notes that symptoms have decreased with cutting back on caffeine and carbonated beverages, addition of pantoprazole, decrease cigarette smoking. Never really had typical heartburn symptoms. On EGD he had a noncritical Schatzki ring which was not manipulated. 4 mm gastric ulcer and other multiple antral erosions were noted. Antral erosion biopsy showed extensive intestinal metaplasia. Patient's pantoprazole was increased to twice a day. He states she's been completely asymptomatic with regards to odynophagia. Denies any dysphagia. 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 Denies any blood in the stool or melena. Has a bowel movement almost every day. Denies any abdominal pain, vomiting, unintentional weight loss.  Current Outpatient Prescriptions  Medication Sig Dispense Refill  . pantoprazole (PROTONIX) 40 MG tablet Take 1 tablet (40 mg total) by mouth 2 (two) times daily before a meal.  60 tablet  5   No current facility-administered medications for this visit.    Allergies as of 04/27/2014 - Review Complete 04/27/2014  Allergen Reaction Noted  . Prednisone  01/14/2013   Past Medical History  Diagnosis Date  . Psoriasis   . Degenerative cervical disc   . Hyperlipidemia   . IFG (impaired fasting glucose)    Past Surgical History  Procedure Laterality Date  . Nose surgery      x 2  . Hernia repair      x 2  . Colonoscopy  06/25/2006    Dr. Gala Romney: extensive left-sided diverticula, 2 six to seven mm pale-appearing hyperplastic-appearing polypoid lesions vs atypcial appearing lipoma. (bx-hyperplastic polyps). next TCS 06/2016  .  Achilles tendon repair      right  . Esophagogastroduodenoscopy N/A 11/28/2013    [id number] Schatzki's ring  not manipulated. Small hiatal hernia. Gastric ulcer and erosions as above. Bx antrum-extensive intestinal metaplasia, no H.pylori  . Savory dilation N/A 11/28/2013    Procedure: SAVORY DILATION;  Surgeon: Daneil Dolin, MD;  Location: [facility];  Service: Endoscopy;  Laterality: N/A;  Venia Minks dilation N/A 11/28/2013    Procedure: Venia Minks DILATION;  Surgeon: Daneil Dolin, MD;  Location: [facility];  Service: Endoscopy;  Laterality: N/A;   Family History  Problem Relation Age of Onset  . Colon cancer Neg Hx    History   Social History  . Marital Status: Married    Spouse Name: N/A    Number of Children: 0  . Years of Education: N/A   Social History Main Topics  . Smoking status: Current Every Day Smoker  . Smokeless tobacco: None  . Alcohol Use: Yes     Comment: 6 beers about 3 days weekly  . Drug Use: No  . Sexual Activity: None   Other Topics Concern  . None   Social History Narrative  . None    ROS:  General: Negative for anorexia, weight loss, fever, chills, fatigue, weakness. ENT: Negative for hoarseness, difficulty swallowing , nasal congestion. CV: Negative for chest pain, angina, palpitations, dyspnea on exertion, peripheral edema.  Respiratory: Negative for dyspnea at rest, dyspnea on exertion, cough, sputum, wheezing.  GI: See history of present illness. GU:  Negative for dysuria, hematuria,  urinary incontinence, urinary frequency, nocturnal urination.  Endo: Negative for unusual weight change.    Physical Examination:   BP 127/86  Pulse 70  Temp(Src) 98.1 F (36.7 C) (Oral)  Ht 6\' 1"  (1.854 m)  Wt 179 lb 3.2 oz (81.285 kg)  BMI 23.65 kg/m2  General: Well-nourished, well-developed in no acute distress.  Eyes: No icterus. Mouth: Oropharyngeal mucosa moist and pink , no lesions erythema or exudate. Lungs: Clear to auscultation  bilaterally.  Heart: Regular rate and rhythm, no murmurs rubs or gallops.  Abdomen: Bowel sounds are normal, nontender, nondistended, no hepatosplenomegaly or masses, no abdominal bruits or hernia , no rebound or guarding.   Extremities: No lower extremity edema. No clubbing or deformities. Neuro: Alert and oriented x 4   Skin: Warm and dry, no jaundice.   Psych: Alert and cooperative, normal mood and affect.

## 2014-05-29 ENCOUNTER — Encounter (HOSPITAL_COMMUNITY): Payer: Self-pay | Admitting: Internal Medicine

## 2014-05-29 ENCOUNTER — Encounter (HOSPITAL_COMMUNITY): Admission: RE | Payer: Self-pay | Source: Ambulatory Visit

## 2014-05-29 ENCOUNTER — Ambulatory Visit (HOSPITAL_COMMUNITY): Admission: RE | Admit: 2014-05-29 | Payer: 59 | Source: Ambulatory Visit | Admitting: Internal Medicine

## 2014-05-29 SURGERY — EGD (ESOPHAGOGASTRODUODENOSCOPY)
Anesthesia: Moderate Sedation

## 2014-06-08 ENCOUNTER — Encounter: Payer: Self-pay | Admitting: Family Medicine

## 2014-06-08 ENCOUNTER — Ambulatory Visit (INDEPENDENT_AMBULATORY_CARE_PROVIDER_SITE_OTHER): Payer: 59 | Admitting: Family Medicine

## 2014-06-08 VITALS — BP 110/80 | Temp 98.7°F | Ht 73.0 in | Wt 174.4 lb

## 2014-06-08 DIAGNOSIS — M545 Low back pain, unspecified: Secondary | ICD-10-CM

## 2014-06-08 MED ORDER — ETODOLAC 400 MG PO TABS: 400.0000 mg | ORAL_TABLET | Freq: Two times a day (BID) | ORAL | Status: DC

## 2014-06-08 MED ORDER — HYDROCODONE-ACETAMINOPHEN 5-325 MG PO TABS: 1.0000 | ORAL_TABLET | Freq: Four times a day (QID) | ORAL | Status: DC | PRN

## 2014-06-08 NOTE — Patient Instructions (Signed)
Fifteen min three times per wk long term  Low Back Sprain with Rehab  A sprain is an injury in which a ligament is torn. The ligaments of the lower back are vulnerable to sprains. However, they are strong and require great force to be injured. These ligaments are important for stabilizing the spinal column. Sprains are classified into three categories. Grade 1 sprains cause pain, but the tendon is not lengthened. Grade 2 sprains include a lengthened ligament, due to the ligament being stretched or partially ruptured. With grade 2 sprains there is still function, although the function may be decreased. Grade 3 sprains involve a complete tear of the tendon or muscle, and function is usually impaired. SYMPTOMS   Severe pain in the lower back.  Sometimes, a feeling of a "pop," "snap," or tear, at the time of injury.  Tenderness and sometimes swelling at the injury site.  Uncommonly, bruising (contusion) within 48 hours of injury.  Muscle spasms in the back. CAUSES  Low back sprains occur when a force is placed on the ligaments that is greater than they can handle. Common causes of injury include:  Performing a stressful act while off-balance.  Repetitive stressful activities that involve movement of the lower back.  Direct hit (trauma) to the lower back. RISK INCREASES WITH:  Contact sports (football, wrestling).  Collisions (major skiing accidents).  Sports that require throwing or lifting (baseball, weightlifting).  Sports involving twisting of the spine (gymnastics, diving, tennis, golf).  Poor strength and flexibility.  Inadequate protection.  Previous back injury or surgery (especially fusion). PREVENTION  Wear properly fitted and padded protective equipment.  Warm up and stretch properly before activity.  Allow for adequate recovery between workouts.  Maintain physical fitness:  Strength, flexibility, and endurance.  Cardiovascular fitness.  Maintain a healthy  body weight. PROGNOSIS  If treated properly, low back sprains usually heal with non-surgical treatment. The length of time for healing depends on the severity of the injury.  RELATED COMPLICATIONS   Recurring symptoms, resulting in a chronic problem.  Chronic inflammation and pain in the low back.  Delayed healing or resolution of symptoms, especially if activity is resumed too soon.  Prolonged impairment.  Unstable or arthritic joints of the low back. TREATMENT  Treatment first involves the use of ice and medicine, to reduce pain and inflammation. The use of strengthening and stretching exercises may help reduce pain with activity. These exercises may be performed at home or with a therapist. Severe injuries may require referral to a therapist for further evaluation and treatment, such as ultrasound. Your caregiver may advise that you wear a back brace or corset, to help reduce pain and discomfort. Often, prolonged bed rest results in greater harm then benefit. Corticosteroid injections may be recommended. However, these should be reserved for the most serious cases. It is important to avoid using your back when lifting objects. At night, sleep on your back on a firm mattress, with a pillow placed under your knees. If non-surgical treatment is unsuccessful, surgery may be needed.  MEDICATION   If pain medicine is needed, nonsteroidal anti-inflammatory medicines (aspirin and ibuprofen), or other minor pain relievers (acetaminophen), are often advised.  Do not take pain medicine for 7 days before surgery.  Prescription pain relievers may be given, if your caregiver thinks they are needed. Use only as directed and only as much as you need.  Ointments applied to the skin may be helpful.  Corticosteroid injections may be given by your caregiver. These  injections should be reserved for the most serious cases, because they may only be given a certain number of times. HEAT AND COLD  Cold  treatment (icing) should be applied for 10 to 15 minutes every 2 to 3 hours for inflammation and pain, and immediately after activity that aggravates your symptoms. Use ice packs or an ice massage.  Heat treatment may be used before performing stretching and strengthening activities prescribed by your caregiver, physical therapist, or athletic trainer. Use a heat pack or a warm water soak. SEEK MEDICAL CARE IF:   Symptoms get worse or do not improve in 2 to 4 weeks, despite treatment.  You develop numbness or weakness in either leg.  You lose bowel or bladder function.  Any of the following occur after surgery: fever, increased pain, swelling, redness, drainage of fluids, or bleeding in the affected area.  New, unexplained symptoms develop. (Drugs used in treatment may produce side effects.) EXERCISES  RANGE OF MOTION (ROM) AND STRETCHING EXERCISES - Low Back Sprain Most people with lower back pain will find that their symptoms get worse with excessive bending forward (flexion) or arching at the lower back (extension). The exercises that will help resolve your symptoms will focus on the opposite motion.  Your physician, physical therapist or athletic trainer will help you determine which exercises will be most helpful to resolve your lower back pain. Do not complete any exercises without first consulting with your caregiver. Discontinue any exercises which make your symptoms worse, until you speak to your caregiver. If you have pain, numbness or tingling which travels down into your buttocks, leg or foot, the goal of the therapy is for these symptoms to move closer to your back and eventually resolve. Sometimes, these leg symptoms will get better, but your lower back pain may worsen. This is often an indication of progress in your rehabilitation. Be very alert to any changes in your symptoms and the activities in which you participated in the 24 hours prior to the change. Sharing this information  with your caregiver will allow him or her to most efficiently treat your condition. These exercises may help you when beginning to rehabilitate your injury. Your symptoms may resolve with or without further involvement from your physician, physical therapist or athletic trainer. While completing these exercises, remember:   Restoring tissue flexibility helps normal motion to return to the joints. This allows healthier, less painful movement and activity.  An effective stretch should be held for at least 30 seconds.  A stretch should never be painful. You should only feel a gentle lengthening or release in the stretched tissue. FLEXION RANGE OF MOTION AND STRETCHING EXERCISES: STRETCH - Flexion, Single Knee to Chest   Lie on a firm bed or floor with both legs extended in front of you.  Keeping one leg in contact with the floor, bring your opposite knee to your chest. Hold your leg in place by either grabbing behind your thigh or at your knee.  Pull until you feel a gentle stretch in your low back. Hold __________ seconds.  Slowly release your grasp and repeat the exercise with the opposite side. Repeat __________ times. Complete this exercise __________ times per day.  STRETCH - Flexion, Double Knee to Chest  Lie on a firm bed or floor with both legs extended in front of you.  Keeping one leg in contact with the floor, bring your opposite knee to your chest.  Tense your stomach muscles to support your back and then lift  your other knee to your chest. Hold your legs in place by either grabbing behind your thighs or at your knees.  Pull both knees toward your chest until you feel a gentle stretch in your low back. Hold __________ seconds.  Tense your stomach muscles and slowly return one leg at a time to the floor. Repeat __________ times. Complete this exercise __________ times per day.  STRETCH - Low Trunk Rotation  Lie on a firm bed or floor. Keeping your legs in front of you, bend  your knees so they are both pointed toward the ceiling and your feet are flat on the floor.  Extend your arms out to the side. This will stabilize your upper body by keeping your shoulders in contact with the floor.  Gently and slowly drop both knees together to one side until you feel a gentle stretch in your low back. Hold for __________ seconds.  Tense your stomach muscles to support your lower back as you bring your knees back to the starting position. Repeat the exercise to the other side. Repeat __________ times. Complete this exercise __________ times per day  EXTENSION RANGE OF MOTION AND FLEXIBILITY EXERCISES: STRETCH - Extension, Prone on Elbows   Lie on your stomach on the floor, a bed will be too soft. Place your palms about shoulder width apart and at the height of your head.  Place your elbows under your shoulders. If this is too painful, stack pillows under your chest.  Allow your body to relax so that your hips drop lower and make contact more completely with the floor.  Hold this position for __________ seconds.  Slowly return to lying flat on the floor. Repeat __________ times. Complete this exercise __________ times per day.  RANGE OF MOTION - Extension, Prone Press Ups  Lie on your stomach on the floor, a bed will be too soft. Place your palms about shoulder width apart and at the height of your head.  Keeping your back as relaxed as possible, slowly straighten your elbows while keeping your hips on the floor. You may adjust the placement of your hands to maximize your comfort. As you gain motion, your hands will come more underneath your shoulders.  Hold this position __________ seconds.  Slowly return to lying flat on the floor. Repeat __________ times. Complete this exercise __________ times per day.  RANGE OF MOTION- Quadruped, Neutral Spine   Assume a hands and knees position on a firm surface. Keep your hands under your shoulders and your knees under your  hips. You may place padding under your knees for comfort.  Drop your head and point your tailbone toward the ground below you. This will round out your lower back like an angry cat. Hold this position for __________ seconds.  Slowly lift your head and release your tail bone so that your back sags into a large arch, like an old horse.  Hold this position for __________ seconds.  Repeat this until you feel limber in your low back.  Now, find your "sweet spot." This will be the most comfortable position somewhere between the two previous positions. This is your neutral spine. Once you have found this position, tense your stomach muscles to support your low back.  Hold this position for __________ seconds. Repeat __________ times. Complete this exercise __________ times per day.  STRENGTHENING EXERCISES - Low Back Sprain These exercises may help you when beginning to rehabilitate your injury. These exercises should be done near your "sweet spot." This  is the neutral, low-back arch, somewhere between fully rounded and fully arched, that is your least painful position. When performed in this safe range of motion, these exercises can be used for people who have either a flexion or extension based injury. These exercises may resolve your symptoms with or without further involvement from your physician, physical therapist or athletic trainer. While completing these exercises, remember:   Muscles can gain both the endurance and the strength needed for everyday activities through controlled exercises.  Complete these exercises as instructed by your physician, physical therapist or athletic trainer. Increase the resistance and repetitions only as guided.  You may experience muscle soreness or fatigue, but the pain or discomfort you are trying to eliminate should never worsen during these exercises. If this pain does worsen, stop and make certain you are following the directions exactly. If the pain is still  present after adjustments, discontinue the exercise until you can discuss the trouble with your caregiver. STRENGTHENING - Deep Abdominals, Pelvic Tilt   Lie on a firm bed or floor. Keeping your legs in front of you, bend your knees so they are both pointed toward the ceiling and your feet are flat on the floor.  Tense your lower abdominal muscles to press your low back into the floor. This motion will rotate your pelvis so that your tail bone is scooping upwards rather than pointing at your feet or into the floor. With a gentle tension and even breathing, hold this position for __________ seconds. Repeat __________ times. Complete this exercise __________ times per day.  STRENGTHENING - Abdominals, Crunches   Lie on a firm bed or floor. Keeping your legs in front of you, bend your knees so they are both pointed toward the ceiling and your feet are flat on the floor. Cross your arms over your chest.  Slightly tip your chin down without bending your neck.  Tense your abdominals and slowly lift your trunk high enough to just clear your shoulder blades. Lifting higher can put excessive stress on the lower back and does not further strengthen your abdominal muscles.  Control your return to the starting position. Repeat __________ times. Complete this exercise __________ times per day.  STRENGTHENING - Quadruped, Opposite UE/LE Lift   Assume a hands and knees position on a firm surface. Keep your hands under your shoulders and your knees under your hips. You may place padding under your knees for comfort.  Find your neutral spine and gently tense your abdominal muscles so that you can maintain this position. Your shoulders and hips should form a rectangle that is parallel with the floor and is not twisted.  Keeping your trunk steady, lift your right hand no higher than your shoulder and then your left leg no higher than your hip. Make sure you are not holding your breath. Hold this position for  __________ seconds.  Continuing to keep your abdominal muscles tense and your back steady, slowly return to your starting position. Repeat with the opposite arm and leg. Repeat __________ times. Complete this exercise __________ times per day.  STRENGTHENING - Abdominals and Quadriceps, Straight Leg Raise   Lie on a firm bed or floor with both legs extended in front of you.  Keeping one leg in contact with the floor, bend the other knee so that your foot can rest flat on the floor.  Find your neutral spine, and tense your abdominal muscles to maintain your spinal position throughout the exercise.  Slowly lift your straight leg  off the floor about 6 inches for a count of 15, making sure to not hold your breath.  Still keeping your neutral spine, slowly lower your leg all the way to the floor. Repeat this exercise with each leg __________ times. Complete this exercise __________ times per day. POSTURE AND BODY MECHANICS CONSIDERATIONS - Low Back Sprain Keeping correct posture when sitting, standing or completing your activities will reduce the stress put on different body tissues, allowing injured tissues a chance to heal and limiting painful experiences. The following are general guidelines for improved posture. Your physician or physical therapist will provide you with any instructions specific to your needs. While reading these guidelines, remember:  The exercises prescribed by your provider will help you have the flexibility and strength to maintain correct postures.  The correct posture provides the best environment for your joints to work. All of your joints have less wear and tear when properly supported by a spine with good posture. This means you will experience a healthier, less painful body.  Correct posture must be practiced with all of your activities, especially prolonged sitting and standing. Correct posture is as important when doing repetitive low-stress activities (typing) as  it is when doing a single heavy-load activity (lifting). RESTING POSITIONS Consider which positions are most painful for you when choosing a resting position. If you have pain with flexion-based activities (sitting, bending, stooping, squatting), choose a position that allows you to rest in a less flexed posture. You would want to avoid curling into a fetal position on your side. If your pain worsens with extension-based activities (prolonged standing, working overhead), avoid resting in an extended position such as sleeping on your stomach. Most people will find more comfort when they rest with their spine in a more neutral position, neither too rounded nor too arched. Lying on a non-sagging bed on your side with a pillow between your knees, or on your back with a pillow under your knees will often provide some relief. Keep in mind, being in any one position for a prolonged period of time, no matter how correct your posture, can still lead to stiffness. PROPER SITTING POSTURE In order to minimize stress and discomfort on your spine, you must sit with correct posture. Sitting with good posture should be effortless for a healthy body. Returning to good posture is a gradual process. Many people can work toward this most comfortably by using various supports until they have the flexibility and strength to maintain this posture on their own. When sitting with proper posture, your ears will fall over your shoulders and your shoulders will fall over your hips. You should use the back of the chair to support your upper back. Your lower back will be in a neutral position, just slightly arched. You may place a small pillow or folded towel at the base of your lower back for  support.  When working at a desk, create an environment that supports good, upright posture. Without extra support, muscles tire, which leads to excessive strain on joints and other tissues. Keep these recommendations in mind: CHAIR:  A chair  should be able to slide under your desk when your back makes contact with the back of the chair. This allows you to work closely.  The chair's height should allow your eyes to be level with the upper part of your monitor and your hands to be slightly lower than your elbows. BODY POSITION  Your feet should make contact with the floor. If this is  not possible, use a foot rest.  Keep your ears over your shoulders. This will reduce stress on your neck and low back. INCORRECT SITTING POSTURES  If you are feeling tired and unable to assume a healthy sitting posture, do not slouch or slump. This puts excessive strain on your back tissues, causing more damage and pain. Healthier options include:  Using more support, like a lumbar pillow.  Switching tasks to something that requires you to be upright or walking.  Talking a brief walk.  Lying down to rest in a neutral-spine position. PROLONGED STANDING WHILE SLIGHTLY LEANING FORWARD  When completing a task that requires you to lean forward while standing in one place for a long time, place either foot up on a stationary 2-4 inch high object to help maintain the best posture. When both feet are on the ground, the lower back tends to lose its slight inward curve. If this curve flattens (or becomes too large), then the back and your other joints will experience too much stress, tire more quickly, and can cause pain. CORRECT STANDING POSTURES Proper standing posture should be assumed with all daily activities, even if they only take a few moments, like when brushing your teeth. As in sitting, your ears should fall over your shoulders and your shoulders should fall over your hips. You should keep a slight tension in your abdominal muscles to brace your spine. Your tailbone should point down to the ground, not behind your body, resulting in an over-extended swayback posture.  INCORRECT STANDING POSTURES  Common incorrect standing postures include a forward  head, locked knees and/or an excessive swayback. WALKING Walk with an upright posture. Your ears, shoulders and hips should all line-up. PROLONGED ACTIVITY IN A FLEXED POSITION When completing a task that requires you to bend forward at your waist or lean over a low surface, try to find a way to stabilize 3 out of 4 of your limbs. You can place a hand or elbow on your thigh or rest a knee on the surface you are reaching across. This will provide you more stability, so that your muscles do not tire as quickly. By keeping your knees relaxed, or slightly bent, you will also reduce stress across your lower back. CORRECT LIFTING TECHNIQUES DO :  Assume a wide stance. This will provide you more stability and the opportunity to get as close as possible to the object which you are lifting.  Tense your abdominals to brace your spine. Bend at the knees and hips. Keeping your back locked in a neutral-spine position, lift using your leg muscles. Lift with your legs, keeping your back straight.  Test the weight of unknown objects before attempting to lift them.  Try to keep your elbows locked down at your sides in order get the best strength from your shoulders when carrying an object.  Always ask for help when lifting heavy or awkward objects. INCORRECT LIFTING TECHNIQUES DO NOT:   Lock your knees when lifting, even if it is a small object.  Bend and twist. Pivot at your feet or move your feet when needing to change directions.  Assume that you can safely pick up even a paperclip without proper posture. Document Released: 07/03/2005 Document Revised: 09/25/2011 Document Reviewed: 10/15/2008 [facility] Patient Information 2015 [place], [place]. This information is not intended to replace advice given to you by your health care provider. Make sure you discuss any questions you have with your health care provider.

## 2014-06-08 NOTE — Progress Notes (Signed)
   Subjective:    Patient ID: Billy Mccormick, male    DOB: [birth year], 58 y.o.   MRN: [id number]  Back Pain This is a new problem. The current episode started in the past 7 days. The problem occurs constantly. The problem is unchanged. The pain is present in the lumbar spine. The quality of the pain is described as aching. The pain radiates to the right thigh. The pain is severe. The pain is the same all the time. Stiffness is present all day. (Groin pain ) He has tried NSAIDs for the symptoms. The treatment provided no relief.   Patient states that he has no other concerns at this time.   Repeat of back pain  First stated in low bk  No pain with sig moving of a tractor  But awoke with a low right lumbar pain  Tender in right ant groin the last few d    Sudden lumb pain with rad to groin  Review of Systems  Musculoskeletal: Positive for back pain.   No vomiting no diarrhea no dysuria ROS otherwise negative    Objective:   Physical Exam alert moderate malaise. Vital stable. HEENT normal. Lungs clear. Heart regular in rhythm. Lumbar region tender to palpation. Right lower groin testicular exam normal      Assessment & Plan:  Impression lumbar strain with radiation plan anti-inflammatory medicine. Hydrocodone when necessary. Exercises discussed in encourage once this combs down. WSL

## 2014-06-09 ENCOUNTER — Encounter: Payer: Self-pay | Admitting: Internal Medicine

## 2014-06-10 ENCOUNTER — Telehealth: Payer: Self-pay | Admitting: Family Medicine

## 2014-06-10 MED ORDER — ALPRAZOLAM 0.5 MG PO TABS: 0.5000 mg | ORAL_TABLET | Freq: Four times a day (QID) | ORAL | Status: DC | PRN

## 2014-06-10 MED ORDER — OXYCODONE-ACETAMINOPHEN 7.5-325 MG PO TABS: 1.0000 | ORAL_TABLET | Freq: Four times a day (QID) | ORAL | Status: DC | PRN

## 2014-06-10 NOTE — Telephone Encounter (Signed)
Spoke to wife and patient. The etodolac and hydrocodone is not touching his pain. He has been taking 2 tablets within 1 hour of each other b/c his pain was at a 10 last night. He is still in bed and cannot get out of it d/t pain. Wife would like something stronger to get him into the car on Friday for the funeral.

## 2014-06-10 NOTE — Telephone Encounter (Signed)
I believe they are also asking for a stronger pain med as well The ones he has at this point are not touching his pain

## 2014-06-10 NOTE — Telephone Encounter (Signed)
percocret 7.7 325 30 one q 4 6 prn drowsy prec

## 2014-06-10 NOTE — Telephone Encounter (Signed)
Xanax .5 mg numb twenty one q 6hrs prn, drowsy prec (pt recently lost bro suddenly)

## 2014-06-10 NOTE — Telephone Encounter (Signed)
Would like something for anxiety

## 2014-06-10 NOTE — Telephone Encounter (Signed)
Patient's wife picked up rx.

## 2014-06-10 NOTE — Telephone Encounter (Signed)
Pt seen here 11/23 given  Etodolac an Hydrocodone   Had a really bad spasms last night is in the bed an can't move His brother suddenly died an will be buried 11-17-2022, needs to get  Up an about to make the arrangements   Wondered if he may need something for anxiety as well at this point?   [facility]

## 2014-06-12 ENCOUNTER — Telehealth: Payer: Self-pay | Admitting: Family Medicine

## 2014-06-12 NOTE — Telephone Encounter (Signed)
Seen 11/23 for lumbar strain with radiation. Prescribed etodolac and hydrocodone. Called on 11/25 because hydrocodone was not helping. Changed to percocet 7.5/325. He taking one every 6 hours and etodolac bid. Taking the edge off but would like a MRI or appt for recheck on Monday. Brother's funeral is today.

## 2014-06-12 NOTE — Telephone Encounter (Signed)
Patient was seen on 06/08/2014 for back pain. He is still having a lot of pain, not getting better.  The pain medicine is taking the edge off, but that's it.  Can we send for MRI, or does he need to be seen again?

## 2014-06-12 NOTE — Telephone Encounter (Signed)
Ov Monday with me (dr Richardson Landry)

## 2014-06-12 NOTE — Telephone Encounter (Signed)
Discussed with patient. Transferred to front to schedule office visit for Monday.

## 2014-06-15 ENCOUNTER — Encounter: Payer: Self-pay | Admitting: Family Medicine

## 2014-06-15 ENCOUNTER — Ambulatory Visit (INDEPENDENT_AMBULATORY_CARE_PROVIDER_SITE_OTHER): Payer: 59 | Admitting: Family Medicine

## 2014-06-15 ENCOUNTER — Telehealth: Payer: Self-pay | Admitting: Family Medicine

## 2014-06-15 VITALS — BP 148/90 | Ht 73.0 in | Wt 175.0 lb

## 2014-06-15 DIAGNOSIS — M545 Low back pain, unspecified: Secondary | ICD-10-CM

## 2014-06-15 DIAGNOSIS — S39012A Strain of muscle, fascia and tendon of lower back, initial encounter: Secondary | ICD-10-CM

## 2014-06-15 MED ORDER — CYCLOBENZAPRINE HCL 10 MG PO TABS: 10.0000 mg | ORAL_TABLET | Freq: Three times a day (TID) | ORAL | Status: AC | PRN

## 2014-06-15 MED ORDER — ETODOLAC 400 MG PO TABS: 400.0000 mg | ORAL_TABLET | Freq: Two times a day (BID) | ORAL | Status: DC

## 2014-06-15 MED ORDER — OXYCODONE-ACETAMINOPHEN 7.5-325 MG PO TABS: 1.0000 | ORAL_TABLET | Freq: Four times a day (QID) | ORAL | Status: DC | PRN

## 2014-06-15 NOTE — Progress Notes (Signed)
   Subjective:    Patient ID: Billy Mccormick, male    DOB: 02/12/1956, 58 y.o.   MRN: [id number]  Back Pain This is a recurrent problem. The problem occurs daily. The problem has been waxing and waning (Had a really bad episode on Tuesday night. He almost called 911. He has not taken pain meds since Saturday. ) since onset. The pain is present in the thoracic spine. The pain radiates to the right thigh (groin). Stiffness is present all day. Associated symptoms include numbness and tingling. He has tried analgesics for the symptoms. The treatment provided mild relief.   Was quite active mid last wk  Had recurrence of pain that was very severe  Searing pain severe hit hard  Tingling and burning sens of right ant leg  Better yest  No percocet since sat eve  In the last 2 days the pain seems to have improved.    Review of Systems  Musculoskeletal: Positive for back pain.  Neurological: Positive for tingling and numbness.   no change in urinary or bowel habits.     Objective:   Physical Exam  Alert mild distress. Vitals stable. Lungs clear. Heart rare rhythm. Negative straight leg raise. Right lower lumbar discomfort to deep palpation no spinal tenderness.      Assessment & Plan:  Impression lumbar strain with secondary features including groin radiation of pain. #2 history of meralgia paresthetica right anterolateral thigh. Plan all of this discussed in great length. 25 minutes most in discussion. Offered physical therapy referral for education patient to consider. Gradual weaning off medications discussed. Nature of injury discussed. No MRI at this time. WSL

## 2014-06-15 NOTE — Telephone Encounter (Signed)
Pt would like to go ahead an have the MRI done please Before 12 noon any day  Call pt if you have further questions

## 2014-06-16 NOTE — Telephone Encounter (Signed)
MRI Lumbar spine scheduled for 06/19/14 at 9:45 am. Patient was notified and Izora Ribas is working on [employer], operational.

## 2014-06-16 NOTE — Telephone Encounter (Signed)
Let pt know i had dictated and closed his clinical note based on my exam and what the pt told me yesterday before he called. Based on what i documented (likely lumbar strain), I am concerned that his insurance co will reject a lumbar MRI but we will try. Plz do.

## 2014-06-18 ENCOUNTER — Other Ambulatory Visit: Payer: Self-pay | Admitting: *Deleted

## 2014-06-18 MED ORDER — PANTOPRAZOLE SODIUM 40 MG PO TBEC: 40.0000 mg | DELAYED_RELEASE_TABLET | Freq: Every day | ORAL | Status: DC

## 2014-06-19 ENCOUNTER — Ambulatory Visit (HOSPITAL_COMMUNITY)
Admission: RE | Admit: 2014-06-19 | Discharge: 2014-06-19 | Disposition: A | Discharge status: Home / Self Care | Payer: 59 | Source: Ambulatory Visit | Attending: Family Medicine | Admitting: Family Medicine

## 2014-06-19 DIAGNOSIS — M1288 Other specific arthropathies, not elsewhere classified, other specified site: Secondary | ICD-10-CM | POA: Diagnosis not present

## 2014-06-19 DIAGNOSIS — M545 Low back pain: Secondary | ICD-10-CM | POA: Diagnosis present

## 2014-06-19 DIAGNOSIS — M5126 Other intervertebral disc displacement, lumbar region: Secondary | ICD-10-CM | POA: Insufficient documentation

## 2014-06-19 DIAGNOSIS — I714 Abdominal aortic aneurysm, without rupture: Secondary | ICD-10-CM | POA: Diagnosis not present

## 2014-06-22 ENCOUNTER — Encounter: Payer: Self-pay | Admitting: Family Medicine

## 2014-06-22 ENCOUNTER — Ambulatory Visit (INDEPENDENT_AMBULATORY_CARE_PROVIDER_SITE_OTHER): Payer: 59 | Admitting: Family Medicine

## 2014-06-22 VITALS — BP 126/80 | Ht 73.0 in | Wt 176.5 lb

## 2014-06-22 DIAGNOSIS — Z125 Encounter for screening for malignant neoplasm of prostate: Secondary | ICD-10-CM

## 2014-06-22 DIAGNOSIS — Z0000 Encounter for general adult medical examination without abnormal findings: Secondary | ICD-10-CM

## 2014-06-22 DIAGNOSIS — M5441 Lumbago with sciatica, right side: Secondary | ICD-10-CM | POA: Insufficient documentation

## 2014-06-22 DIAGNOSIS — I714 Abdominal aortic aneurysm, without rupture, unspecified: Secondary | ICD-10-CM | POA: Insufficient documentation

## 2014-06-22 NOTE — Patient Instructions (Signed)
Smoking Cessation Quitting smoking is important to your health and has many advantages. However, it is not always easy to quit since nicotine is a very addictive drug. Oftentimes, people try 3 times or more before being able to quit. This document explains the best ways for you to prepare to quit smoking. Quitting takes hard work and a lot of effort, but you can do it. ADVANTAGES OF QUITTING SMOKING  You will live longer, feel better, and live better.  Your body will feel the impact of quitting smoking almost immediately.  Within 20 minutes, blood pressure decreases. Your pulse returns to its normal level.  After 8 hours, carbon monoxide levels in the blood return to normal. Your oxygen level increases.  After 24 hours, the chance of having a heart attack starts to decrease. Your breath, hair, and body stop smelling like smoke.  After 48 hours, damaged nerve endings begin to recover. Your sense of taste and smell improve.  After 72 hours, the body is virtually free of nicotine. Your bronchial tubes relax and breathing becomes easier.  After 2 to 12 weeks, lungs can hold more air. Exercise becomes easier and circulation improves.  The risk of having a heart attack, stroke, cancer, or lung disease is greatly reduced.  After 1 year, the risk of coronary heart disease is cut in half.  After 5 years, the risk of stroke falls to the same as a nonsmoker.  After 10 years, the risk of lung cancer is cut in half and the risk of other cancers decreases significantly.  After 15 years, the risk of coronary heart disease drops, usually to the level of a nonsmoker.  If you are pregnant, quitting smoking will improve your chances of having a healthy baby.  The people you live with, especially any children, will be healthier.  You will have extra money to spend on things other than cigarettes. QUESTIONS TO THINK ABOUT BEFORE ATTEMPTING TO QUIT You may want to talk about your answers with your  health care provider.  Why do you want to quit?  If you tried to quit in the past, what helped and what did not?  What will be the most difficult situations for you after you quit? How will you plan to handle them?  Who can help you through the tough times? Your family? Friends? A health care provider?  What pleasures do you get from smoking? What ways can you still get pleasure if you quit? Here are some questions to ask your health care provider:  How can you help me to be successful at quitting?  What medicine do you think would be best for me and how should I take it?  What should I do if I need more help?  What is smoking withdrawal like? How can I get information on withdrawal? GET READY  Set a quit date.  Change your environment by getting rid of all cigarettes, ashtrays, matches, and lighters in your home, car, or work. Do not let people smoke in your home.  Review your past attempts to quit. Think about what worked and what did not. GET SUPPORT AND ENCOURAGEMENT You have a better chance of being successful if you have help. You can get support in many ways.  Tell your family, friends, and coworkers that you are going to quit and need their support. Ask them not to smoke around you.  Get individual, group, or telephone counseling and support. Programs are available at local hospitals and health centers. Call   your local health department for information about programs in your area.  Spiritual beliefs and practices may help some smokers quit.  Download a "quit meter" on your computer to keep track of quit statistics, such as how long you have gone without smoking, cigarettes not smoked, and money saved.  Get a self-help book about quitting smoking and staying off tobacco. LEARN NEW SKILLS AND BEHAVIORS  Distract yourself from urges to smoke. Talk to someone, go for a walk, or occupy your time with a task.  Change your normal routine. Take a different route to work.  Drink tea instead of coffee. Eat breakfast in a different place.  Reduce your stress. Take a hot bath, exercise, or read a book.  Plan something enjoyable to do every day. Reward yourself for not smoking.  Explore interactive web-based programs that specialize in helping you quit. GET MEDICINE AND USE IT CORRECTLY Medicines can help you stop smoking and decrease the urge to smoke. Combining medicine with the above behavioral methods and support can greatly increase your chances of successfully quitting smoking.  Nicotine replacement therapy helps deliver nicotine to your body without the negative effects and risks of smoking. Nicotine replacement therapy includes nicotine gum, lozenges, inhalers, nasal sprays, and skin patches. Some may be available over-the-counter and others require a prescription.  Antidepressant medicine helps people abstain from smoking, but how this works is unknown. This medicine is available by prescription.  Nicotinic receptor partial agonist medicine simulates the effect of nicotine in your brain. This medicine is available by prescription. Ask your health care provider for advice about which medicines to use and how to use them based on your health history. Your health care provider will tell you what side effects to look out for if you choose to be on a medicine or therapy. Carefully read the information on the package. Do not use any other product containing nicotine while using a nicotine replacement product.  RELAPSE OR DIFFICULT SITUATIONS Most relapses occur within the first 3 months after quitting. Do not be discouraged if you start smoking again. Remember, most people try several times before finally quitting. You may have symptoms of withdrawal because your body is used to nicotine. You may crave cigarettes, be irritable, feel very hungry, cough often, get headaches, or have difficulty concentrating. The withdrawal symptoms are only temporary. They are strongest  when you first quit, but they will go away within 10-14 days. To reduce the chances of relapse, try to:  Avoid drinking alcohol. Drinking lowers your chances of successfully quitting.  Reduce the amount of caffeine you consume. Once you quit smoking, the amount of caffeine in your body increases and can give you symptoms, such as a rapid heartbeat, sweating, and anxiety.  Avoid smokers because they can make you want to smoke.  Do not let weight gain distract you. Many smokers will gain weight when they quit, usually less than 10 pounds. Eat a healthy diet and stay active. You can always lose the weight gained after you quit.  Find ways to improve your mood other than smoking. FOR MORE INFORMATION  www.smokefree.gov  Document Released: 06/27/2001 Document Revised: 11/17/2013 Document Reviewed: 10/12/2011 [employer] Patient Information 2015 [employer]. This information is not intended to replace advice given to you by your health care provider. Make sure you discuss any questions you have with your health care provider.  

## 2014-06-22 NOTE — Progress Notes (Signed)
   Subjective:    Patient ID: Judie Petit, male    DOB: October 07, 1955, 58 y.o.   MRN: [id number]  HPI Patient is here today to discuss the MRI lumbar spine results. (Results in the system) Patient is doing well. Patient has no other concerns at this time.  Please see the MRI results. Patient relates right leg pain is doing somewhat better he also relates numbness is doing better. He denies any abdominal pain denies dysuria or rectal bleeding  Review of Systems     Objective:   Physical Exam His lungs are clear hearts regular low back moderate tenderness he also has weakness in the extensor portions of his quadriceps and hip. Lower leg strength is good.       Assessment & Plan:  #1 patient encouraged to quit smoking he was offered various ways to do so and he was told very important to quit smoking  #2 herniated disc with right leg weakness he Arty has an appointment with Dr. Ronnald Ramp in [place]. More than likely if the weakness has not gone away he will end up needing to have surgery  #3 aortic aneurysm not large but it needs immediate attention with vascular surgery so that they can get him plugged into the system for monitoring. We will go ahead and set up the referral.  25 minutes spent with the patient and his wife discussing these issues and answering questions  I highly recommended for patient to get his lab work and follow-up for a wellness visit

## 2014-06-25 ENCOUNTER — Other Ambulatory Visit: Payer: Self-pay | Admitting: Family Medicine

## 2014-06-25 DIAGNOSIS — I714 Abdominal aortic aneurysm, without rupture, unspecified: Secondary | ICD-10-CM

## 2014-06-30 ENCOUNTER — Other Ambulatory Visit: Payer: Self-pay | Admitting: *Deleted

## 2014-06-30 DIAGNOSIS — I714 Abdominal aortic aneurysm, without rupture, unspecified: Secondary | ICD-10-CM

## 2014-08-10 ENCOUNTER — Encounter: Payer: Self-pay | Admitting: Vascular Surgery

## 2014-08-11 ENCOUNTER — Ambulatory Visit (INDEPENDENT_AMBULATORY_CARE_PROVIDER_SITE_OTHER): Payer: 59 | Admitting: Vascular Surgery

## 2014-08-11 ENCOUNTER — Encounter: Payer: Self-pay | Admitting: Vascular Surgery

## 2014-08-11 ENCOUNTER — Ambulatory Visit (HOSPITAL_COMMUNITY)
Admission: RE | Admit: 2014-08-11 | Discharge: 2014-08-11 | Disposition: A | Discharge status: Home / Self Care | Payer: 59 | Source: Ambulatory Visit | Attending: Vascular Surgery | Admitting: Vascular Surgery

## 2014-08-11 VITALS — BP 141/94 | HR 68 | Ht 73.0 in | Wt 180.0 lb

## 2014-08-11 DIAGNOSIS — I714 Abdominal aortic aneurysm, without rupture, unspecified: Secondary | ICD-10-CM

## 2014-08-11 NOTE — Patient Instructions (Addendum)
Patient was given a provider drawn diagram of his/her vascular findings. This was discussed with the patient during their office visit today. If the patient has any questions about this drawing after leaving the office today, he/she may contact our Discharge Nurse,  Leota Jacobsen, RN     [phone]   Ext [phone]    Please review the tobacco cessation information given to you today. It lists many hints that are useful in your effort to stop smoking. The [facility] contact phone # is [phone] These nurses and advisors offer lots of FREE information and aids to help you quit.    The [facility] Quit Smoking line #  2361682354, they will also assist you with programs designed to help you stop smoking.     Abdominal Aortic Aneurysm An aneurysm is a weakened or damaged part of an artery wall that bulges from the normal force of blood pumping through the body. An abdominal aortic aneurysm is an aneurysm that occurs in the lower part of the aorta, the main artery of the body.  The major concern with an abdominal aortic aneurysm is that it can enlarge and burst (rupture) or blood can flow between the layers of the wall of the aorta through a tear (aorticdissection). Both of these conditions can cause bleeding inside the body and can be life threatening unless diagnosed and treated promptly. CAUSES  The exact cause of an abdominal aortic aneurysm is unknown. Some contributing factors are:   A hardening of the arteries caused by the buildup of fat and other substances in the lining of a blood vessel (arteriosclerosis).  Inflammation of the walls of an artery (arteritis).   Connective tissue diseases, such as Marfan syndrome.   Abdominal trauma.   An infection, such as syphilis or staphylococcus, in the wall of the aorta (infectious aortitis) caused by bacteria. RISK FACTORS  Risk factors that contribute to an abdominal aortic aneurysm may include:  Age older than 43 years.   High  blood pressure (hypertension).  Male gender.  Ethnicity (white race).  Obesity.  Family history of aneurysm (first degree relatives only).  Tobacco use. PREVENTION  The following healthy lifestyle habits may help decrease your risk of abdominal aortic aneurysm:  Quitting smoking. Smoking can raise your blood pressure and cause arteriosclerosis.  Limiting or avoiding alcohol.  Keeping your blood pressure, blood sugar level, and cholesterol levels within normal limits.  Decreasing your salt intake. In somepeople, too much salt can raise blood pressure and increase your risk of abdominal aortic aneurysm.  Eating a diet low in saturated fats and cholesterol.  Increasing your fiber intake by including whole grains, vegetables, and fruits in your diet. Eating these foods may help lower blood pressure.  Maintaining a healthy weight.  Staying physically active and exercising regularly. SYMPTOMS  The symptoms of abdominal aortic aneurysm may vary depending on the size and rate of growth of the aneurysm.Most grow slowly and do not have any symptoms. When symptoms do occur, they may include:  Pain (abdomen, side, lower back, or groin). The pain may vary in intensity. A sudden onset of severe pain may indicate that the aneurysm has ruptured.  Feeling full after eating only small amounts of food.  Nausea or vomiting or both.  Feeling a pulsating lump in the abdomen.  Feeling faint or passing out. DIAGNOSIS  Since most unruptured abdominal aortic aneurysms have no symptoms, they are often discovered during diagnostic exams for other conditions. An aneurysm may be found during  the following procedures:  Ultrasonography (A one-time screening for abdominal aortic aneurysm by ultrasonography is also recommended for all men aged 46-75 years who have ever smoked).  X-ray exams.  A computed tomography (CT).  Magnetic resonance imaging (MRI).  Angiography or arteriography. TREATMENT   Treatment of an abdominal aortic aneurysm depends on the size of your aneurysm, your age, and risk factors for rupture. Medication to control blood pressure and pain may be used to manage aneurysms smaller than 6 cm. Regular monitoring for enlargement may be recommended by your caregiver if:  The aneurysm is 3-4 cm in size (an annual ultrasonography may be recommended).  The aneurysm is 4-4.5 cm in size (an ultrasonography every 6 months may be recommended).  The aneurysm is larger than 4.5 cm in size (your caregiver may ask that you be examined by a vascular surgeon). If your aneurysm is larger than 6 cm, surgical repair may be recommended. There are two main methods for repair of an aneurysm:   Endovascular repair (a minimally invasive surgery). This is done most often.  Open repair. This method is used if an endovascular repair is not possible. Document Released: 04/12/2005 Document Revised: 10/28/2012 Document Reviewed: 08/02/2012 [facility] Patient Information 2015 [place], [place]. This information is not intended to replace advice given to you by your health care provider. Make sure you discuss any questions you have with your health care provider.

## 2014-08-11 NOTE — Progress Notes (Signed)
Patient name: Billy Mccormick MRN: [id number] DOB: 01-Sep-1955 Sex: male   Referred by: Wolfgang Phoenix  Reason for referral:  Chief Complaint  Patient presents with  . New Evaluation    AAA    HISTORY OF PRESENT ILLNESS: Patient presents today for discussion of infrarenal abdominal aortic aneurysm. This has been found on MRI looking for degenerative disc disease. I do have his actual films for review and the have reviewed his MRI. The prediction is 3.9 cm and I would agree that this is no no greater than this probably 1-2 mm smaller than this. This does begin below the level the renal arteries and ends prior to the aortic bifurcation. The patient has no known family history of this. He does have a long history of smoking. No symptoms referable to his aneurysm.  Past Medical History  Diagnosis Date  . Psoriasis   . Degenerative cervical disc   . Hyperlipidemia   . IFG (impaired fasting glucose)     Past Surgical History  Procedure Laterality Date  . Nose surgery      x 2  . Hernia repair      x 2  . Colonoscopy  06/25/2006    Dr. Gala Romney: extensive left-sided diverticula, 2 six to seven mm pale-appearing hyperplastic-appearing polypoid lesions vs atypcial appearing lipoma. (bx-hyperplastic polyps). next TCS 06/2016  . Achilles tendon repair      right  . Esophagogastroduodenoscopy N/A 11/28/2013    [id number] Schatzki's ring  not manipulated. Small hiatal hernia. Gastric ulcer and erosions as above. Bx antrum-extensive intestinal metaplasia, no H.pylori  . Savory dilation N/A 11/28/2013    Procedure: SAVORY DILATION;  Surgeon: Daneil Dolin, MD;  Location: [facility];  Service: Endoscopy;  Laterality: N/A;  Venia Minks dilation N/A 11/28/2013    Procedure: Venia Minks DILATION;  Surgeon: Daneil Dolin, MD;  Location: [facility];  Service: Endoscopy;  Laterality: N/A;  . Esophagogastroduodenoscopy N/A 05/26/2014    Procedure: ESOPHAGOGASTRODUODENOSCOPY (EGD);  Surgeon: Daneil Dolin, MD;  Location: [facility];  Service: Endoscopy;  Laterality: N/A;  7:30AM    History   Social History  . Marital Status: Married    Spouse Name: N/A    Number of Children: 0  . Years of Education: N/A   Occupational History  . Not on file.   Social History Main Topics  . Smoking status: Current Every Day Smoker -- 1.00 packs/day for 98 years    Types: Cigarettes  . Smokeless tobacco: Not on file  . Alcohol Use: 6.0 oz/week    7 Cans of beer, 3 Shots of liquor per week     Comment: 6 beers about 3 days weekly  . Drug Use: No  . Sexual Activity: Not on file   Other Topics Concern  . Not on file   Social History Narrative    Family History  Problem Relation Age of Onset  . Colon cancer Neg Hx   . Hyperlipidemia Mother   . Hyperlipidemia Father   . Kidney disease Brother     Allergies as of 08/11/2014 - Review Complete 08/11/2014  Allergen Reaction Noted  . Prednisone  01/14/2013    Current Outpatient Prescriptions on File Prior to Visit  Medication Sig Dispense Refill  . oxyCODONE-acetaminophen (PERCOCET) 7.5-325 MG per tablet Take 1 tablet by mouth every 6 (six) hours as needed for pain. 30 tablet 0  . pantoprazole (PROTONIX) 40 MG tablet Take 1 tablet (40 mg total)  by mouth daily. 30 tablet 5  . ALPRAZolam (XANAX) 0.5 MG tablet Take 1 tablet (0.5 mg total) by mouth 4 (four) times daily as needed for anxiety or sleep. (Patient not taking: Reported on 08/11/2014) 20 tablet 0  . cyclobenzaprine (FLEXERIL) 10 MG tablet Take 1 tablet (10 mg total) by mouth every 8 (eight) hours as needed for muscle spasms. (Patient not taking: Reported on 08/11/2014) 30 tablet 1  . etodolac (LODINE) 400 MG tablet Take 1 tablet (400 mg total) by mouth 2 (two) times daily. (Patient not taking: Reported on 08/11/2014) 28 tablet 0  . pantoprazole (PROTONIX) 40 MG tablet Take 1 tablet (40 mg total) by mouth 2 (two) times daily before a meal. (Patient not taking: Reported on 08/11/2014) 60  tablet 5   No current facility-administered medications on file prior to visit.     REVIEW OF SYSTEMS:  Positives indicated with an "X"  CARDIOVASCULAR:  [ ]  chest pain   [ ]  chest pressure   [ ]  palpitations   [ ]  orthopnea   [ ]  dyspnea on exertion   [ ]  claudication   [ ]  rest pain   [ ]  DVT   [ ]  phlebitis PULMONARY:   [ ]  productive cough   [ ]  asthma   [ ]  wheezing NEUROLOGIC:   [ ]  weakness  [ ]  paresthesias  [ ]  aphasia  [ ]  amaurosis  [ ]  dizziness HEMATOLOGIC:   [ ]  bleeding problems   [ ]  clotting disorders MUSCULOSKELETAL:  [ ]  joint pain   [ ]  joint swelling GASTROINTESTINAL: [ ]   blood in stool  [ ]   hematemesis GENITOURINARY:  [ ]   dysuria  [ ]   hematuria PSYCHIATRIC:  [ ]  history of major depression INTEGUMENTARY:  [ ]  rashes  [ ]  ulcers CONSTITUTIONAL:  [ ]  fever   [ ]  chills  PHYSICAL EXAMINATION:  General: The patient is a well-nourished male, in no acute distress. Vital signs are BP 141/94 mmHg  Pulse 68  Ht 6\' 1"  (1.854 m)  Wt 180 lb (81.647 kg)  BMI 23.75 kg/m2  SpO2 96% Pulmonary: There is a good air exchange bilaterally without wheezing or rales. Abdomen: Soft and non-tender with normal pitch bowel sounds. He is thin and does have a prominent aortic pulsation with no tenderness over his aorta Musculoskeletal: There are no major deformities.  There is no significant extremity pain. Neurologic: No focal weakness or paresthesias are detected, Skin: There are no ulcer or rashes noted. Psychiatric: The patient has normal affect. Cardiovascular: There is a regular rate and rhythm without significant murmur appreciated. Pulse status 2+ radial 2+ femoral 2+ popliteal and 2+ posterior tibial pulses with no evidence of peripheral aneurysm Carotid arteries without bruits bilaterally  MRI was reviewed with the actual films been reviewed as well.  Impression and Plan:  3.9 cm infrarenal abdominal aortic aneurysm, asymptomatic. Had a very long discussion with  the patient explaining the concern regarding this. I explained that he had no significant risk whatsoever of rupture with his relatively small aneurysm. I did explain the natural history is for these to continue to expand over time. With his size of aneurysm I have recommended yearly ultrasound for evaluation of schedule appointment with him for 1 year. I did explain symptoms of leaking aneurysm and the need to immediately report to the hospital for evaluation should this occur. I did explain the importance of blood pressure control which is not a problem for him and  also the importance of smoking cessation. We'll see him again in one year for continued follow-up    Leilyn Frayre [facility] Office: [phone]

## 2015-06-21 ENCOUNTER — Encounter: Payer: Self-pay | Admitting: Family Medicine

## 2015-06-21 ENCOUNTER — Ambulatory Visit (INDEPENDENT_AMBULATORY_CARE_PROVIDER_SITE_OTHER): Payer: 59 | Admitting: Family Medicine

## 2015-06-21 VITALS — BP 148/90 | Ht 73.0 in | Wt 179.0 lb

## 2015-06-21 DIAGNOSIS — R030 Elevated blood-pressure reading, without diagnosis of hypertension: Secondary | ICD-10-CM | POA: Diagnosis not present

## 2015-06-21 DIAGNOSIS — Z79899 Other long term (current) drug therapy: Secondary | ICD-10-CM | POA: Diagnosis not present

## 2015-06-21 DIAGNOSIS — IMO0001 Reserved for inherently not codable concepts without codable children: Secondary | ICD-10-CM

## 2015-06-21 DIAGNOSIS — K219 Gastro-esophageal reflux disease without esophagitis: Secondary | ICD-10-CM | POA: Diagnosis not present

## 2015-06-21 DIAGNOSIS — Z139 Encounter for screening, unspecified: Secondary | ICD-10-CM | POA: Diagnosis not present

## 2015-06-21 DIAGNOSIS — I714 Abdominal aortic aneurysm, without rupture, unspecified: Secondary | ICD-10-CM

## 2015-06-21 MED ORDER — PANTOPRAZOLE SODIUM 40 MG PO TBEC: 40.0000 mg | DELAYED_RELEASE_TABLET | Freq: Every day | ORAL | Status: DC

## 2015-06-21 NOTE — Progress Notes (Signed)
   Subjective:    Patient ID: Billy Mccormick, male    DOB: [birth year], 59 y.o.   MRN: [id number] Patient arrives with several distinct concerns.   Gastroesophageal Reflux This is a chronic problem. Treatments tried: Protonix.  tried to back off without sig success, lst wk tried off couple days and cause substantial flare. History of significant reflux history of known ulcers   Sciatica ha calmed down somewhat.. Still expansion low back pain still right middle thigh pain. Vicente Males sense of weakness at times.  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 This is discussed. No family history. Positive smoker. Positive male over age 7.   Blood pressure has been up off-and-on last couple years. Patient stays physically active. Unfortunately betime salt patient and also unfortunately smokes,   Patient states no other concerns this visit.  Review of Systems No headache no chest pain no abdominal pain no change in bowel habits no blood in stool ROS otherwise negative    Objective:   Physical Exam Alert vital stable HEENT normal. Blood pressure repeat improve but still borderline at 144/88 lungs clear heart rare rhythm low back some tenderness percussion negative straight leg raise abdomen benign       Assessment & Plan:  Impression reflux/history of ulcer stable and protonic. Multiple options discussed including back off to Zantac 300 patient will try #2 borderline elevated blood pressure concerning with history of aneurysm and multiple risk factors discussed at length patient wishes to hold off on medications for now which is reasonable. #3 back pain discuss ongoing #4 aneurysm discussed including typical approach long-term prognosis etc. plan cut down on beer intake. Cut down salt intake. Recheck in several months for wellness exam blood work then maintain Protonix trial of Zantac 25 minutes spent most in discussion [facility]

## 2015-06-21 NOTE — Patient Instructions (Signed)
Try 300 mg zantac each morn for a week

## 2015-07-13 ENCOUNTER — Ambulatory Visit (INDEPENDENT_AMBULATORY_CARE_PROVIDER_SITE_OTHER): Payer: 59 | Admitting: Family Medicine

## 2015-07-13 ENCOUNTER — Encounter: Payer: Self-pay | Admitting: Family Medicine

## 2015-07-13 VITALS — BP 130/90 | Temp 98.9°F | Ht 73.0 in | Wt 176.5 lb

## 2015-07-13 DIAGNOSIS — J209 Acute bronchitis, unspecified: Secondary | ICD-10-CM

## 2015-07-13 DIAGNOSIS — J310 Chronic rhinitis: Secondary | ICD-10-CM

## 2015-07-13 DIAGNOSIS — J329 Chronic sinusitis, unspecified: Secondary | ICD-10-CM

## 2015-07-13 MED ORDER — AMOXICILLIN-POT CLAVULANATE 875-125 MG PO TABS: 1.0000 | ORAL_TABLET | Freq: Two times a day (BID) | ORAL | Status: AC

## 2015-07-13 NOTE — Progress Notes (Signed)
   Subjective:    Patient ID: Billy Mccormick, male    DOB: [birth year], 59 y.o.   MRN: [id number]  Cough This is a new problem. The current episode started 1 to 4 weeks ago. The problem has been unchanged. Associated symptoms include a fever and headaches. Associated symptoms comments: hoarse voice . Nothing aggravates the symptoms. Treatments tried: otc cold and flu medicine. The treatment provided no relief.   Patient has no other concerns at this time.   Very little prod cough    ha temples persoistent  2 weeks worth of symptoms fever achiness early on. Got better then hit again. Headache frontal comes and goes by temp Review of Systems  Constitutional: Positive for fever.  Respiratory: Positive for cough.   Neurological: Positive for headaches.       Objective:   Physical Exam Alert mild malaise. Place hoarse HEENT moderate nasal congestion frontal tenderness pharynx normal neck supple lungs clear. Heart regular in rhythm       Assessment & Plan:  Impression rhinosinusitis /bronchitis post viralplan antibiotics prescribed. Symptomatic care discussed warning signs discussed

## 2015-08-17 ENCOUNTER — Ambulatory Visit: Payer: 59 | Admitting: Vascular Surgery

## 2015-08-17 ENCOUNTER — Other Ambulatory Visit (HOSPITAL_COMMUNITY): Payer: 59

## 2015-09-03 ENCOUNTER — Encounter: Payer: Self-pay | Admitting: Vascular Surgery

## 2015-09-11 ENCOUNTER — Other Ambulatory Visit: Payer: Self-pay | Admitting: Family Medicine

## 2015-09-11 LAB — HEPATIC FUNCTION PANEL
ALT: 20 U/L (ref 9–46)
AST: 21 U/L (ref 10–35)
Albumin: 4.2 g/dL (ref 3.6–5.1)
Alkaline Phosphatase: 51 U/L (ref 40–115)
BILIRUBIN DIRECT: 0.1 mg/dL (ref <=0.2)
BILIRUBIN INDIRECT: 0.3 mg/dL (ref 0.2–1.2)
Total Bilirubin: 0.4 mg/dL (ref 0.2–1.2)
Total Protein: 6.7 g/dL (ref 6.1–8.1)

## 2015-09-11 LAB — LIPID PANEL
CHOL/HDL RATIO: 5.4 ratio — AB (ref <=5.0)
Cholesterol: 195 mg/dL (ref 125–200)
HDL: 36 mg/dL — ABNORMAL LOW (ref >=40)
LDL CALC: 132 mg/dL — AB (ref <130)
Triglycerides: 136 mg/dL (ref <150)
VLDL: 27 mg/dL (ref <30)

## 2015-09-11 LAB — BASIC METABOLIC PANEL
BUN: 13 mg/dL (ref 7–25)
CHLORIDE: 106 mmol/L (ref 98–110)
CO2: 21 mmol/L (ref 20–31)
Calcium: 8.9 mg/dL (ref 8.6–10.3)
Creat: 0.86 mg/dL (ref 0.70–1.33)
GLUCOSE: 94 mg/dL (ref 65–99)
POTASSIUM: 4.7 mmol/L (ref 3.5–5.3)
SODIUM: 138 mmol/L (ref 135–146)

## 2015-09-13 LAB — PSA: PSA: 0.71 ng/mL (ref <=4.00)

## 2015-09-14 ENCOUNTER — Encounter: Payer: Self-pay | Admitting: Vascular Surgery

## 2015-09-14 ENCOUNTER — Ambulatory Visit (INDEPENDENT_AMBULATORY_CARE_PROVIDER_SITE_OTHER): Payer: 59 | Admitting: Vascular Surgery

## 2015-09-14 ENCOUNTER — Ambulatory Visit (HOSPITAL_COMMUNITY)
Admission: RE | Admit: 2015-09-14 | Discharge: 2015-09-14 | Disposition: A | Discharge status: Home / Self Care | Payer: 59 | Source: Ambulatory Visit | Attending: Vascular Surgery | Admitting: Vascular Surgery

## 2015-09-14 VITALS — BP 143/93 | HR 65 | Temp 97.2°F | Resp 18 | Ht 74.0 in | Wt 179.2 lb

## 2015-09-14 DIAGNOSIS — E785 Hyperlipidemia, unspecified: Secondary | ICD-10-CM | POA: Insufficient documentation

## 2015-09-14 DIAGNOSIS — I7409 Other arterial embolism and thrombosis of abdominal aorta: Secondary | ICD-10-CM | POA: Diagnosis not present

## 2015-09-14 DIAGNOSIS — I714 Abdominal aortic aneurysm, without rupture, unspecified: Secondary | ICD-10-CM

## 2015-09-14 NOTE — Progress Notes (Signed)
Filed Vitals:   09/14/15 0909 09/14/15 0912  BP: 149/98 143/93  Pulse: 65   Temp: 97.2 F (36.2 C)   TempSrc: Oral   Resp: 18   Height: 6\' 2"  (1.88 m)   Weight: 179 lb 3.2 oz (81.285 kg)   SpO2: 95%

## 2015-09-14 NOTE — Progress Notes (Signed)
[facility]  Patient name: Billy Mccormick MRN: [id number] DOB: 30-Oct-1955 Sex: male  REASON FOR VISIT: Follow-up of asymptomatic abdominal aortic aneurysm  HPI: Billy Mccormick is a 60 y.o. male here for follow-up of asymptomatic abdominal aortic aneurysm. This was discovered incidentally on lumbar back MRI one year ago. Maximal diameter that time was 3.9. He had an ultrasound in our office on 08/11/2014 predicting 3.3 cm maximal diameter. He has no symptoms referable to this. Has had no new major medical difficulties.  Past Medical History  Diagnosis Date  . Psoriasis   . Degenerative cervical disc   . Hyperlipidemia   . IFG (impaired fasting glucose)   . Lumbar herniated disc 2015    L4,L5    Family History  Problem Relation Age of Onset  . Colon cancer Neg Hx   . Hyperlipidemia Mother   . Hyperlipidemia Father   . Kidney disease Brother     SOCIAL HISTORY: Social History  Substance Use Topics  . Smoking status: Current Every Day Smoker -- 1.00 packs/day for 98 years    Types: Cigarettes  . Smokeless tobacco: Not on file     Comment: currently smokes 3/4 pack daily   . Alcohol Use: 6.0 oz/week    7 Cans of beer, 3 Shots of liquor per week     Comment: 6 beers about 3 days weekly    Allergies  Allergen Reactions  . Prednisone     Side effects    Current Outpatient Prescriptions  Medication Sig Dispense Refill  . pantoprazole (PROTONIX) 40 MG tablet Take 1 tablet (40 mg total) by mouth daily. 30 tablet 11  . ALPRAZolam (XANAX) 0.5 MG tablet Take 1 tablet (0.5 mg total) by mouth 4 (four) times daily as needed for anxiety or sleep. (Patient not taking: Reported on 08/11/2014) 20 tablet 0  . etodolac (LODINE) 400 MG tablet Take 1 tablet (400 mg total) by mouth 2 (two) times daily. (Patient not taking: Reported on 08/11/2014) 28 tablet 0  . oxyCODONE-acetaminophen (PERCOCET) 7.5-325 MG per tablet Take 1 tablet by mouth every 6 (six)  hours as needed for pain. (Patient not taking: Reported on 06/21/2015) 30 tablet 0  . pantoprazole (PROTONIX) 40 MG tablet Take 1 tablet (40 mg total) by mouth 2 (two) times daily before a meal. (Patient not taking: Reported on 08/11/2014) 60 tablet 5   No current facility-administered medications for this visit.    REVIEW OF SYSTEMS:  [X]  denotes positive finding, [ ]  denotes negative finding Cardiac  Comments:  Chest pain or chest pressure:    Shortness of breath upon exertion:    Short of breath when lying flat:    Irregular heart rhythm:        Vascular    Pain in calf, thigh, or hip brought on by ambulation:    Pain in feet at night that wakes you up from your sleep:     Blood clot in your veins:    Leg swelling:         Pulmonary    Oxygen at home:    Productive cough:     Wheezing:         Neurologic    Sudden weakness in arms or legs:     Sudden numbness in arms or legs:     Sudden onset of difficulty speaking or slurred speech:    Temporary loss of vision in one eye:  Problems with dizziness:         Gastrointestinal    Blood in stool:     Vomited blood:         Genitourinary    Burning when urinating:     Blood in urine:        Psychiatric    Major depression:         Hematologic    Bleeding problems:    Problems with blood clotting too easily:        Skin    Rashes or ulcers:        Constitutional    Fever or chills:      PHYSICAL EXAM: Filed Vitals:   09/14/15 0909 09/14/15 0912  BP: 149/98 143/93  Pulse: 65   Temp: 97.2 F (36.2 C)   TempSrc: Oral   Resp: 18   Height: 6\' 2"  (1.88 m)   Weight: 179 lb 3.2 oz (81.285 kg)   SpO2: 95%     GENERAL: The patient is a well-nourished male, in no acute distress. The vital signs are documented above. CARDIAC: There is a regular rate and rhythm.  VASCULAR: 2+ radial 2+ femoral and 2+ popliteal 2+ dorsalis pedis pulses bilaterally with no evidence of peripheral aneurysm PULMONARY: There is good  air exchange bilaterally without wheezing or rales. ABDOMEN: Soft and non-tender with normal pitched bowel sounds. No aneurysm palpable MUSCULOSKELETAL: There are no major deformities or cyanosis. NEUROLOGIC: No focal weakness or paresthesias are detected. SKIN: There are no ulcers or rashes noted. PSYCHIATRIC: The patient has a normal affect.  DATA:  He underwent the or. Ultrasound today in our office. This shows maximal diameter of 4.0 cm  MEDICAL ISSUES: Had long discussion with patient. Explained that the this is consistent with the MRI scan that he had initially. Somewhat larger than his last ultrasound. Explained the great deal of variability in these studies. I'm not concerned regarding rapid expansion. We will see him again in one year with repeat ultrasound. I did explain symptoms of aching aneurysm he knows repeated immediately to the hospital should this occur. At his young age and excellent overall health, I did explain that there is a reasonable lifelong risk that he will require aneurysm repair if he has expansion in size. We'll see him again in one year with ultrasound follow-up No Follow-up on file.   Curt Jews [facility] [place]: [phone]

## 2015-09-14 NOTE — Addendum Note (Signed)
Addended by: Dorthula Rue L on: 09/14/2015 10:38 AM   Modules accepted: Orders

## 2015-09-20 ENCOUNTER — Ambulatory Visit (INDEPENDENT_AMBULATORY_CARE_PROVIDER_SITE_OTHER): Payer: 59 | Admitting: Family Medicine

## 2015-09-20 ENCOUNTER — Encounter: Payer: Self-pay | Admitting: Family Medicine

## 2015-09-20 VITALS — BP 132/88 | Ht 73.0 in | Wt 178.0 lb

## 2015-09-20 DIAGNOSIS — Z0000 Encounter for general adult medical examination without abnormal findings: Secondary | ICD-10-CM

## 2015-09-20 DIAGNOSIS — I10 Essential (primary) hypertension: Secondary | ICD-10-CM | POA: Diagnosis not present

## 2015-09-20 NOTE — Patient Instructions (Signed)
Hypertension Hypertension, commonly called high blood pressure, is when the force of blood pumping through your arteries is too strong. Your arteries are the blood vessels that carry blood from your heart throughout your body. A blood pressure reading consists of a higher number over a lower number, such as 110/72. The higher number (systolic) is the pressure inside your arteries when your heart pumps. The lower number (diastolic) is the pressure inside your arteries when your heart relaxes. Ideally you want your blood pressure below 120/80. Hypertension forces your heart to work harder to pump blood. Your arteries may become narrow or stiff. Having untreated or uncontrolled hypertension can cause heart attack, stroke, kidney disease, and other problems. RISK FACTORS Some risk factors for high blood pressure are controllable. Others are not.  Risk factors you cannot control include:   Race. You may be at higher risk if you are African American.  Age. Risk increases with age.  Gender. Men are at higher risk than women before age 45 years. After age 65, women are at higher risk than men. Risk factors you can control include:  Not getting enough exercise or physical activity.  Being overweight.  Getting too much fat, sugar, calories, or salt in your diet.  Drinking too much alcohol. SIGNS AND SYMPTOMS Hypertension does not usually cause signs or symptoms. Extremely high blood pressure (hypertensive crisis) may cause headache, anxiety, shortness of breath, and nosebleed. DIAGNOSIS To check if you have hypertension, your health care provider will measure your blood pressure while you are seated, with your arm held at the level of your heart. It should be measured at least twice using the same arm. Certain conditions can cause a difference in blood pressure between your right and left arms. A blood pressure reading that is higher than normal on one occasion does not mean that you need treatment. If  it is not clear whether you have high blood pressure, you may be asked to return on a different day to have your blood pressure checked again. Or, you may be asked to monitor your blood pressure at home for 1 or more weeks. TREATMENT Treating high blood pressure includes making lifestyle changes and possibly taking medicine. Living a healthy lifestyle can help lower high blood pressure. You may need to change some of your habits. Lifestyle changes may include:  Following the DASH diet. This diet is high in fruits, vegetables, and whole grains. It is low in salt, red meat, and added sugars.  Keep your sodium intake below 2,300 mg per day.  Getting at least 30-45 minutes of aerobic exercise at least 4 times per week.  Losing weight if necessary.  Not smoking.  Limiting alcoholic beverages.  Learning ways to reduce stress. Your health care provider may prescribe medicine if lifestyle changes are not enough to get your blood pressure under control, and if one of the following is true:  You are 18-59 years of age and your systolic blood pressure is above 140.  You are 60 years of age or older, and your systolic blood pressure is above 150.  Your diastolic blood pressure is above 90.  You have diabetes, and your systolic blood pressure is over 140 or your diastolic blood pressure is over 90.  You have kidney disease and your blood pressure is above 140/90.  You have heart disease and your blood pressure is above 140/90. Your personal target blood pressure may vary depending on your medical conditions, your age, and other factors. HOME CARE INSTRUCTIONS    Have your blood pressure rechecked as directed by your health care provider.   Take medicines only as directed by your health care provider. Follow the directions carefully. Blood pressure medicines must be taken as prescribed. The medicine does not work as well when you skip doses. Skipping doses also puts you at risk for  problems.  Do not smoke.   Monitor your blood pressure at home as directed by your health care provider. SEEK MEDICAL CARE IF:   You think you are having a reaction to medicines taken.  You have recurrent headaches or feel dizzy.  You have swelling in your ankles.  You have trouble with your vision. SEEK IMMEDIATE MEDICAL CARE IF:  You develop a severe headache or confusion.  You have unusual weakness, numbness, or feel faint.  You have severe chest or abdominal pain.  You vomit repeatedly.  You have trouble breathing. MAKE SURE YOU:   Understand these instructions.  Will watch your condition.  Will get help right away if you are not doing well or get worse.   This information is not intended to replace advice given to you by your health care provider. Make sure you discuss any questions you have with your health care provider.   Document Released: 07/03/2005 Document Revised: 11/17/2014 Document Reviewed: 04/25/2013 Elsevier Interactive Patient Education 2016 Else[employer] Inc[employer] Cholesterol Cholesterol is a white, waxy, fat-like substance needed by your body in small amounts. The liver makes all the cholesterol you need. Cholesterol is carried from the liver by the blood through the blood vessels. Deposits of cholesterol (plaque) may build up on blood vessel walls. These make the arteries narrower and stiffer. Cholesterol plaques increase the risk for heart attack and stroke.  You cannot feel your cholesterol level even if it is very high. The only way to know it is high is with a blood test. Once you know your cholesterol levels, you should keep a record of the test results. Work with your health care provider to keep your levels in the desired range.  WHAT DO THE RESULTS MEAN?  Total cholesterol is a rough measure of all the cholesterol in your blood.   LDL is the so-called bad cholesterol. This is the type that deposits cholesterol in the walls of the arteries. You want  this level to be low.   HDL is the good cholesterol because it cleans the arteries and carries the LDL away. You want this level to be high.  Triglycerides are fat that the body can either burn for energy or store. High levels are closely linked to heart disease.  WHAT ARE THE DESIRED LEVELS OF CHOLESTEROL?  Total cholesterol below 200.   LDL below 100 for people at risk, below 70 for those at very high risk.   HDL above 50 is good, above 60 is best.   Triglycerides below 150.  HOW CAN I LOWER MY CHOLESTEROL?  Diet. Follow your diet programs as directed by your health care provider.   Choose fish or white meat chicken and [place], roasted or baked. Limit fatty cuts of red meat, fried foods, and processed meats, such as sausage and lunch meats.   Eat lots of fresh fruits and vegetables.  Choose whole grains, beans, pasta, potatoes, and cereals.   Use only small amounts of olive, corn, or canola oils.   Avoid butter, mayonnaise, shortening, or palm kernel oils.  Avoid foods with trans fats.   Drink skim or nonfat milk and eat low-fat or nonfat yogurt and  cheeses. Avoid whole milk, cream, ice cream, egg yolks, and full-fat cheeses.   Healthy desserts include angel food cake, ginger snaps, animal crackers, hard candy, popsicles, and low-fat or nonfat frozen yogurt. Avoid pastries, cakes, pies, and cookies.   Exercise. Follow your exercise programs as directed by your health care provider.   A regular program helps decrease LDL and raise HDL.   A regular program helps with weight control.   Do things that increase your activity level like gardening, walking, or taking the stairs. Ask your health care provider about how you can be more active in your daily life.   Medicine. Take medicine only as directed by your health care provider.   Medicine may be prescribed by your health care provider to help lower cholesterol and decrease the risk for heart disease.    If you have several risk factors, you may need medicine even if your levels are normal.   This information is not intended to replace advice given to you by your health care provider. Make sure you discuss any questions you have with your health care provider.   Document Released: 03/28/2001 Document Revised: 07/24/2014 Document Reviewed: 04/16/2013 Elsevier Interactive Patient Education [employer].

## 2015-09-20 NOTE — Progress Notes (Signed)
Subjective:    Patient ID: Billy Mccormick, male    DOB: [birth year], 60 y.o.   MRN: [id number]  HPI The patient comes in today for a wellness visit.    A review of their health history was completed.  A review of medications was also completed.  Any needed refills; protonix  Eating habits: health conscious  Falls/  MVA accidents in past few months: none  Regular exercise: no- needs to do better  Specialist pt sees on regular basis: none  Preventative health issues were discussed.   Additional concerns: none  bp GEENRALLY GOOD AT WORK , NUMBERS GOOD  ZANTAC WORE FOFF       Results for orders placed or performed in visit on [id number]  Basic metabolic panel  Result Value Ref Range   Sodium 138 135 - 146 mmol/L   Potassium 4.7 3.5 - 5.3 mmol/L   Chloride 106 98 - 110 mmol/L   CO2 21 20 - 31 mmol/L   Glucose, Bld 94 65 - 99 mg/dL   BUN 13 7 - 25 mg/dL   Creat 0.86 0.70 - 1.33 mg/dL   Calcium 8.9 8.6 - 10.3 mg/dL  Lipid panel  Result Value Ref Range   Cholesterol 195 125 - 200 mg/dL   Triglycerides 136 <150 mg/dL   HDL 36 (L) >=40 mg/dL   Total CHOL/HDL Ratio 5.4 (H) <=5.0 Ratio   VLDL 27 <30 mg/dL   LDL Cholesterol 132 (H) <130 mg/dL  Hepatic function panel  Result Value Ref Range   Total Bilirubin 0.4 0.2 - 1.2 mg/dL   Bilirubin, Direct 0.1 <=0.2 mg/dL   Indirect Bilirubin 0.3 0.2 - 1.2 mg/dL   Alkaline Phosphatase 51 40 - 115 U/L   AST 21 10 - 35 U/L   ALT 20 9 - 46 U/L   Total Protein 6.7 6.1 - 8.1 g/dL   Albumin 4.2 3.6 - 5.1 g/dL  PSA  Result Value Ref Range   PSA 0.71 <=4.00 ng/mL     Review of Systems  Constitutional: Negative for fever, activity change and appetite change.  HENT: Negative for congestion and rhinorrhea.   Eyes: Negative for discharge.  Respiratory: Negative for cough and wheezing.   Cardiovascular: Negative for chest pain.  Gastrointestinal: Negative for vomiting, abdominal pain and blood in stool.  Genitourinary:  Negative for frequency and difficulty urinating.  Musculoskeletal: Negative for neck pain.  Skin: Negative for rash.  Allergic/Immunologic: Negative for environmental allergies and food allergies.  Neurological: Negative for weakness and headaches.  Psychiatric/Behavioral: Negative for agitation.  All other systems reviewed and are negative.      Objective:   Physical Exam  Constitutional: He appears well-developed and well-nourished.  HENT:  Head: Normocephalic and atraumatic.  Right Ear: External ear normal.  Left Ear: External ear normal.  Nose: Nose normal.  Mouth/Throat: Oropharynx is clear and moist.  Eyes: EOM are normal. Pupils are equal, round, and reactive to light.  Neck: Normal range of motion. Neck supple. No thyromegaly present.  Cardiovascular: Normal rate, regular rhythm and normal heart sounds.   No murmur heard. Pulmonary/Chest: Effort normal and breath sounds normal. No respiratory distress. He has no wheezes.  Abdominal: Soft. Bowel sounds are normal. He exhibits no distension and no mass. There is no tenderness.  Genitourinary: Penis normal.  Musculoskeletal: Normal range of motion. He exhibits no edema.  Lymphadenopathy:    He has no cervical adenopathy.  Neurological: He is alert. He exhibits normal muscle tone.  Skin: Skin is warm and dry. No erythema.  Psychiatric: He has a normal mood and affect. His behavior is normal. Judgment normal.  Vitals reviewed.         Assessment & Plan:  Impression 1 well adult exam #2 essential hypertension long discussion held. With elevated numbers particularly with #3 advise medication, patient wishes to hold off for now #3 abdominal aneurysm followed by specialist plan educational information given diet exercise discussed blood work discussed follow-up as scheduled [facility]

## 2015-10-02 ENCOUNTER — Encounter: Payer: Self-pay | Admitting: Family Medicine

## 2015-10-02 DIAGNOSIS — I10 Essential (primary) hypertension: Secondary | ICD-10-CM

## 2015-10-02 HISTORY — DX: Essential (primary) hypertension: I10

## 2016-01-24 ENCOUNTER — Ambulatory Visit (INDEPENDENT_AMBULATORY_CARE_PROVIDER_SITE_OTHER): Payer: 59 | Admitting: Family Medicine

## 2016-01-24 ENCOUNTER — Encounter: Payer: Self-pay | Admitting: Family Medicine

## 2016-01-24 VITALS — BP 150/90 | Temp 98.5°F | Ht 73.0 in | Wt 178.0 lb

## 2016-01-24 DIAGNOSIS — M25551 Pain in right hip: Secondary | ICD-10-CM

## 2016-01-24 DIAGNOSIS — M545 Low back pain, unspecified: Secondary | ICD-10-CM

## 2016-01-24 MED ORDER — CHLORZOXAZONE 500 MG PO TABS: 500.0000 mg | ORAL_TABLET | Freq: Three times a day (TID) | ORAL | Status: DC | PRN

## 2016-01-24 MED ORDER — ETODOLAC 400 MG PO TABS: 400.0000 mg | ORAL_TABLET | Freq: Two times a day (BID) | ORAL | Status: DC

## 2016-01-24 MED ORDER — OXYCODONE-ACETAMINOPHEN 7.5-325 MG PO TABS: ORAL_TABLET | ORAL | Status: DC

## 2016-01-24 NOTE — Progress Notes (Signed)
   Subjective:    Patient ID: Billy Mccormick, male    DOB: 11-19-1955, 59 y.o.   MRN: [id number]  Back Pain This is a new problem. The problem occurs constantly. The problem is unchanged. The pain is present in the lumbar spine. The quality of the pain is described as aching. The pain radiates to the right thigh. The pain is moderate. The pain is the same all the time. Stiffness is present all day. He has tried NSAIDs for the symptoms. The treatment provided no relief.   Patient states he has no other concerns at this time.   Pt had a ladder collapse under him one wk ago  Felt stiff in back afterward but overall felt bettte  Did ok thru the wk  Lower back pain and hip and inner thigh severe painNext  History of neuropathic pain and an MRI of the lumbar spine which showed pressure on nerve root.  Notes upper thigh muscles or jumping at times. And spasm-like pain  Had leftover meds froim before  Review of Systems  Musculoskeletal: Positive for back pain.       Objective:   Physical Exam  Alert vital stable clear distress holding leg up in almost a straight leg raise fashion states it helps the pain lungs clear. Heart rare rhythm right lower lumbar very tender to palpation right groin very tender right inner thigh tender and able to rotate hip      Assessment & Plan:   Impression somewhat curious muscle ligament inflammatory pain groin and surrounding regions. Occurred a week after acute injury plan anti-inflammatory medicine. Pain medicine. Muscle spasm as a local measures discussed many many questions answered 25 minutes spent most in discussion recheck in one week if no improvement will need to press on repeat MRI

## 2016-02-02 ENCOUNTER — Ambulatory Visit (INDEPENDENT_AMBULATORY_CARE_PROVIDER_SITE_OTHER): Payer: 59 | Admitting: Family Medicine

## 2016-02-02 ENCOUNTER — Encounter: Payer: Self-pay | Admitting: Family Medicine

## 2016-02-02 ENCOUNTER — Ambulatory Visit (HOSPITAL_COMMUNITY)
Admission: RE | Admit: 2016-02-02 | Discharge: 2016-02-02 | Disposition: A | Discharge status: Home / Self Care | Payer: 59 | Source: Ambulatory Visit | Attending: Family Medicine | Admitting: Family Medicine

## 2016-02-02 VITALS — BP 148/90 | Ht 73.0 in | Wt 178.0 lb

## 2016-02-02 DIAGNOSIS — M5136 Other intervertebral disc degeneration, lumbar region: Secondary | ICD-10-CM | POA: Insufficient documentation

## 2016-02-02 DIAGNOSIS — M1611 Unilateral primary osteoarthritis, right hip: Secondary | ICD-10-CM | POA: Diagnosis not present

## 2016-02-02 DIAGNOSIS — M545 Low back pain: Secondary | ICD-10-CM | POA: Insufficient documentation

## 2016-02-02 DIAGNOSIS — M25559 Pain in unspecified hip: Secondary | ICD-10-CM | POA: Insufficient documentation

## 2016-02-02 DIAGNOSIS — M5431 Sciatica, right side: Secondary | ICD-10-CM

## 2016-02-02 DIAGNOSIS — R29898 Other symptoms and signs involving the musculoskeletal system: Secondary | ICD-10-CM | POA: Diagnosis not present

## 2016-02-02 MED ORDER — OXYCODONE-ACETAMINOPHEN 7.5-325 MG PO TABS: ORAL_TABLET | ORAL | Status: DC

## 2016-02-02 MED ORDER — ETODOLAC 400 MG PO TABS: 400.0000 mg | ORAL_TABLET | Freq: Two times a day (BID) | ORAL | Status: DC

## 2016-02-02 NOTE — Progress Notes (Signed)
   Subjective:    Patient ID: Billy Mccormick, male    DOB: 1955-08-05, 60 y.o.   MRN: [id number]  HPI Patient arrives with follow up on back pain and right leg pain. Patient states he is only 10-15% better and still having a really hard time. Started July 1st with a fall, got a lot worse the 9th Was on ladder Fell-able to get up but progressively worse Feels weak in the leg' cant lay on his di side This patient was doing well until referral for lateral he had a lot of sit tenderness and soreness initially then it got a little bit better but then got dramatically worse with pain radiating from the buttock into the side groin region into the upper thigh Sit helps a little Feels tender  Review of Systems He denies any loss of bladder bowel or bladder    Objective:   Physical Exam Very nice gentleman is in a lot of pain on physical exam lumbar tenderness pain and discomfort more so on the right side and left side. Patient also with increased pain with straight leg raise. Patient can walk on heels and toes without difficulty Patient does have weakness of the quadriceps muscle Patient also has pain radiates from the by Dr. into the groin region and down quadriceps.       Assessment & Plan:  X-rays indicated to make sure there is not an underlying fracture x-ray the pelvis as well as lumbar spine recommended  X-rays came back negative  As discussed with the patient because of the motor weakness he is having in his right leg along with severe pain and inability to walk because of this this patient does need to have MRI of the lumbar spine to help rule out possibility of a severe herniated disc. This is not something that can wait several weeks because of the motor weakness that is going on await the findings. Pain medication may be used as needed.  Patient was taken out of work through August 6 return to work August 7 but this is a estimation patient is unable to do bending squatting or  extensive walking that is required with his job.  25 minutes was spent with the patient. Greater than half the time was spent in discussion and answering questions and counseling regarding the issues that the patient came in for today.

## 2016-02-07 ENCOUNTER — Other Ambulatory Visit: Payer: Self-pay | Admitting: Family Medicine

## 2016-02-09 ENCOUNTER — Ambulatory Visit (HOSPITAL_COMMUNITY)
Admission: RE | Admit: 2016-02-09 | Discharge: 2016-02-09 | Disposition: A | Discharge status: Home / Self Care | Payer: 59 | Source: Ambulatory Visit | Attending: Family Medicine | Admitting: Family Medicine

## 2016-02-09 DIAGNOSIS — I714 Abdominal aortic aneurysm, without rupture: Secondary | ICD-10-CM | POA: Insufficient documentation

## 2016-02-09 DIAGNOSIS — M4806 Spinal stenosis, lumbar region: Secondary | ICD-10-CM | POA: Insufficient documentation

## 2016-02-09 DIAGNOSIS — M5126 Other intervertebral disc displacement, lumbar region: Secondary | ICD-10-CM | POA: Insufficient documentation

## 2016-02-09 DIAGNOSIS — M545 Low back pain: Secondary | ICD-10-CM | POA: Diagnosis not present

## 2016-02-10 ENCOUNTER — Telehealth: Payer: Self-pay | Admitting: Family Medicine

## 2016-02-10 ENCOUNTER — Other Ambulatory Visit: Payer: Self-pay | Admitting: Family Medicine

## 2016-02-10 DIAGNOSIS — M545 Low back pain: Secondary | ICD-10-CM

## 2016-02-10 DIAGNOSIS — Z0289 Encounter for other administrative examinations: Secondary | ICD-10-CM

## 2016-02-10 NOTE — Telephone Encounter (Signed)
Encounter to put in referral for Neurosurgery per Dr.Steve Luking

## 2016-02-11 ENCOUNTER — Telehealth: Payer: Self-pay | Admitting: Family Medicine

## 2016-02-11 NOTE — Telephone Encounter (Signed)
Ok write for forty-five more pain tabs

## 2016-02-11 NOTE — Telephone Encounter (Signed)
Patient would like to go ahead and get Rx for pain medication just in case to have on hand.  He said he can pick this up Monday.

## 2016-02-11 NOTE — Telephone Encounter (Signed)
Seen 7/19 for back and right leg pain. Given rx for oxycodone #40. Take one every 4 -6 hours. Tried to call to get more info. No answer.

## 2016-02-14 MED ORDER — OXYCODONE-ACETAMINOPHEN 7.5-325 MG PO TABS: ORAL_TABLET | ORAL | 0 refills | Status: DC

## 2016-02-14 NOTE — Telephone Encounter (Signed)
Prescription up front for pick up. Patient to pick up prescription on Monday

## 2016-02-16 ENCOUNTER — Ambulatory Visit (HOSPITAL_COMMUNITY): Payer: 59

## 2016-03-17 ENCOUNTER — Encounter: Payer: Self-pay | Admitting: Family Medicine

## 2016-03-17 ENCOUNTER — Ambulatory Visit (INDEPENDENT_AMBULATORY_CARE_PROVIDER_SITE_OTHER): Payer: 59 | Admitting: Family Medicine

## 2016-03-17 VITALS — BP 122/80 | Ht 73.0 in | Wt 177.0 lb

## 2016-03-17 DIAGNOSIS — Z9889 Other specified postprocedural states: Secondary | ICD-10-CM | POA: Diagnosis not present

## 2016-03-17 DIAGNOSIS — I10 Essential (primary) hypertension: Secondary | ICD-10-CM | POA: Diagnosis not present

## 2016-03-17 DIAGNOSIS — M549 Dorsalgia, unspecified: Secondary | ICD-10-CM

## 2016-03-17 NOTE — Progress Notes (Signed)
   Subjective:    Patient ID: Billy Mccormick, male    DOB: 27-Jun-1956, 60 y.o.   MRN: [id number]  HPIfollow up on blood pressure. Pt states bp has been running high on last several visits. Was walking before he got down with his back. Had surgery and doing much better.   Strength better but not ideal in the thigh, notes some weakness  One time pt ref to assess for back and leg strengtheneing   Compliant with blood pressure medication.  Blood pressure medicine and blood pressure levels reviewed today with patient. Compliant with blood pressure medicine. States does not miss a dose. No obvious side effects. Blood pressure generally good when checked elsewhere. Watching salt intake.  Entire hospital record and prior notes reviewed. Patient's pain progressed on. He had substantial ruptured disc and underwent surgery. Feeling a lot better. Still notes some residual weakness in his right leg to improve. Also residual pain in the back and right hip area. Thinks he may benefit from a one-time visit to physical therapy    Review of Systems No headache, no major weight loss or weight gain, no chest pain no back pain abdominal pain no change in bowel habits complete ROS otherwise negative     Objective:   Physical Exam Alert vital stable lungs clear heart rare rhythm H&T normal blood pressure excellent on repeat abdomen benign spine nontender reflexes intact still slight weakness noted in quadriceps along with paresthesias in her thigh       Assessment & Plan:  Impression hypertension good control discussed maintain same #2 improved symptomatology and strength post neurosurgical intervention with residual symptoms long discussion held patient would like to try physical therapy. 25 minutes spent most in discussion maintain same meds physical therapy referral mainly for education

## 2016-04-17 ENCOUNTER — Ambulatory Visit (HOSPITAL_COMMUNITY): Payer: 59 | Attending: Family Medicine | Admitting: Physical Therapy

## 2016-04-17 DIAGNOSIS — M6281 Muscle weakness (generalized): Secondary | ICD-10-CM | POA: Diagnosis not present

## 2016-04-17 DIAGNOSIS — R29898 Other symptoms and signs involving the musculoskeletal system: Secondary | ICD-10-CM | POA: Insufficient documentation

## 2016-04-17 NOTE — Patient Instructions (Signed)
   SINGLE LEG BRIDGE - MODIFIED  While lying on your back, raise your buttocks off the floor/bed into a bridge position.    Next straighten a leg so that only one leg is supporting your body. Then, return that leg back to the ground and change to the other side.      Try and maintain your pelvis level the entire time. Make sure you are squeezing your belly button to your spine.  Repeat 5-10 times each side, 1-2 times per day.      HAMSTRING STRETCH - TABLE, BED OR COUCH  Sit on a raised flat surface where you can prop your affected leg up on it such as a treatment table, couch or bed.  You may use a towel or towel wrapped around the ball of your foot to help you maintain good posture.   While keeping your knee straight to slightly bent, slowly lean forward and reach your hands towards your foot until a gentle stretch is felt along the back of your knee/thigh.   Hold for 30 seconds, and relax; repeat 3 times.     ELASTIC BAND HIP ABDUCTION  While standing with an elastic band attached to your leg, pull an elastic band out to the side.  Make sure your toes are facing FORWARD.  Repeat 10 times each side, 1-2 times per day.    Seated Hip Flexion  ( Alternating Knee Raises)   Begin sitting with good posture. Activate your core stabilizing muscles to protect lumbar spine. Lift your left leg from the hip, keeping your knee bent,  hold, then return to the starting position. Lift your right leg, hold 3 seconds, then return to the starting position.   Repeat 10 times each side, 1-2 times per day.

## 2016-04-17 NOTE — Therapy (Signed)
[facility] [address] [place], [place], [address] Phone: [phone]   Fax:  [phone]  Physical Therapy Evaluation  Patient Details  Name: Billy Mccormick MRN: [id number] Date of Birth: 10-05-1955 Referring Provider: Baltazar Apo   Encounter Date: 04/17/2016      PT End of Session - 04/17/16 1744    Visit Number 1   Number of Visits 6   Date for PT Re-Evaluation 05/15/16   Authorization Type [place] Time Period 04/17/16 to 05/29/16   PT Start Time [id number]   PT Stop Time 1732   PT Time Calculation (min) 42 min   Activity Tolerance Patient tolerated treatment well   Behavior During Therapy [facility] for tasks assessed/performed      Past Medical History:  Diagnosis Date  . Degenerative cervical disc   . Essential hypertension 10/02/2015  . Hyperlipidemia   . IFG (impaired fasting glucose)   . Lumbar herniated disc 2015   L4,L5  . Psoriasis     Past Surgical History:  Procedure Laterality Date  . ACHILLES TENDON REPAIR     right  . COLONOSCOPY  06/25/2006   Dr. Gala Romney: extensive left-sided diverticula, 2 six to seven mm pale-appearing hyperplastic-appearing polypoid lesions vs atypcial appearing lipoma. (bx-hyperplastic polyps). next TCS 06/2016  . ESOPHAGOGASTRODUODENOSCOPY N/A 11/28/2013   [id number] Schatzki's ring  not manipulated. Small hiatal hernia. Gastric ulcer and erosions as above. Bx antrum-extensive intestinal metaplasia, no H.pylori  . ESOPHAGOGASTRODUODENOSCOPY N/A 05/26/2014   Procedure: ESOPHAGOGASTRODUODENOSCOPY (EGD);  Surgeon: Daneil Dolin, MD;  Location: [facility];  Service: Endoscopy;  Laterality: N/A;  7:30AM  . HERNIA REPAIR     x 2  . MALONEY DILATION N/A 11/28/2013   Procedure: Venia Minks DILATION;  Surgeon: Daneil Dolin, MD;  Location: [facility];  Service: Endoscopy;  Laterality: N/A;  . NOSE SURGERY     x 2  . SAVORY DILATION N/A 11/28/2013   Procedure:  SAVORY DILATION;  Surgeon: Daneil Dolin, MD;  Location: [facility];  Service: Endoscopy;  Laterality: N/A;    There were no vitals filed for this visit.       Subjective Assessment - 04/17/16 1651    Subjective Patient fell off of a ladder on July 1st, from about 38ft up; he landed on the ladder, which collapsed under him. The pain in his back did not start until the next day, then a week later he really had severe pain. He had images done, which revealed a ruptured disc and he went in for surgery to clean up this area. The surgery resolved his pain however he has had ongoing numbness and weakness. He has a hard time keeping himself from doing too much; he is still on lifting precautions, can only lift 10#, and he was never in a back brace. He has had a ruptured disc before, in 2015, which was not repaired, which also caused numbness in his thigh. He walks regularly and has started lifting weights as well. The biggest thing he needs is hamstring stretching and core work.    Pertinent History Abdominal aortic aneurysm, HTN, GERD, lumbar surgery for ruptured disc, achilles rupture repair    How long can you sit comfortably? unlimited    How long can you stand comfortably? unlimited    How long can you walk comfortably? unlimited    Patient Stated Goals stretch out hamstings, work on core    Currently in Pain?  No/denies            [facility] PT Assessment - 04/17/16 0001      Assessment   Medical Diagnosis back pain    Referring Provider Baltazar Apo    Onset Date/Surgical Date 01/15/16   Next MD Visit No followup      Precautions   Precaution Comments cannot lift more than 10#, watch bending/twisting/etc      Balance Screen   Has the patient fallen in the past 6 months Yes   How many times? 1- off of ladder that caused injury    Has the patient had a decrease in activity level because of a fear of falling?  No   Is the patient reluctant to leave their home because of a fear of  falling?  No     Prior Function   Level of Independence Independent with basic ADLs;Independent;Independent with gait;Independent with transfers   Vocation Full time employment   Microbiologist, automation; needs to go up ladders, be on his feet quite a bit but no heavy lifting      Observation/Other Assessments   Focus on Therapeutic Outcomes (FOTO)  33% limited      Strength   Right Hip Flexion 4/5   Right Hip ABduction 4+/5   Left Hip Flexion 5/5   Left Hip ABduction 4/5   Right Knee Flexion 4+/5   Right Knee Extension 5/5   Left Knee Flexion 4+/5   Left Knee Extension 4/5   Right Ankle Dorsiflexion 5/5   Left Ankle Dorsiflexion 5/5     Flexibility   Hamstrings moderate stiffness L LE    Piriformis approx 40% limited L LE      6 minute walk test results    Aerobic Endurance Distance Walked 1130   Endurance additional comments 3MWT      High Level Balance   High Level Balance Comments 30 seconds L LE, 25 seconds R LE                            PT Education - 04/17/16 1741    Education provided Yes   Education Details prognosis, POC, HEP; importance of core strength and core muscle coordination, nerve healing times, refer to surgeon to see how long precautions are in place, importance of compliance with HEP, advice for remaining compliance with EHP throughout his day     Person(s) Educated Patient   Methods Explanation;Demonstration;Handout   Comprehension Verbalized understanding;Returned demonstration;Need further instruction          PT Short Term Goals - 04/17/16 1747      PT SHORT TERM GOAL #1   Title Patient to demonstrate complete resolution of L sided muscle stiffness in order to improve general mechanics with functional tasks    Time 3   Period Weeks   Status New     PT SHORT TERM GOAL #2   Title Patient to be able to verbalize the importance of quality core strength for functional tasks and activities as  well as for support of general lumbar area    Time 3   Period Weeks   Status New     PT SHORT TERM GOAL #3   Title Patient to be independent in correctly and consistently performing appropriate HEP, to be updated PRN    Time 1   Period Weeks   Status New           PT  Long Term Goals - 04/17/16 1748      PT LONG TERM GOAL #1   Title Patient to demonstrate strength 5/5 in all tested muscle groups and will demonstrate high level core strength via ability to perform bird-dog exercise in quadruped without ddropping of pelvis in order to improve functional task performance and tolerance    Time 6   Period Weeks   Status New     PT LONG TERM GOAL #2   Title Patient to demonstrate correct functional lifting mechanics in order to prevent exacerbation of lumbar injury and to assist in preventing other injuries to lumbar area in general    Time 6   Period Weeks   Status New     PT LONG TERM GOAL #3   Title Patient to be independent in correctly performing advanced core and LE strengthening program in order to show self-efficacy in managing condition    Time 6   Period Weeks   Status New               Plan - 04/17/16 1745    Clinical Impression Statement Patient arrives status-post surgery for a ruptured disc; he reports he never received any PT after his surgery, and things are going well except for chronic weakness/numbness in R LE as well as some muscle stiffness and core weakness he feels has been limiting him. Upon examination, patient does reveal some key functional weakness including and especially R quad and general core musculature, as well some significant muscle stiffness mostly L sided and likely due to compensation patterns for weakness R LE. Patient has been working on an exercise and walking program on his own at home and seems motivated to participate in skilled PT services at this time. Education provided on core strength, benefit of coming to PT at least 1x/week,  and HEP. Recommend skilled PT services to address functional limitations, manage and advance appropriate activity/exercise program, and assist in reaching optimal level of function.    Rehab Potential Excellent   Clinical Impairments Affecting Rehab Potential history of multiple ruptured discs, still on lumbar precautions    PT Frequency 1x / week   PT Duration 6 weeks   PT Treatment/Interventions ADLs/Self Care Home Management;Biofeedback;Cryotherapy;Moist Heat;Functional mobility training;Therapeutic activities;Therapeutic exercise;Balance training;Neuromuscular re-education;Patient/family education;Manual techniques;Passive range of motion;Energy conservation;Taping   PT Next Visit Plan review HEP and goals; focus on functional LE strength and core, update HEP each visit if appropriate    PT Home Exercise Plan 10/2: single leg bridges, standing hip ABD with red TB, hamstring stretch, seated hip flexion holds    Consulted and Agree with Plan of Care Patient      Patient will benefit from skilled therapeutic intervention in order to improve the following deficits and impairments:  Improper body mechanics, Decreased coordination, Increased muscle spasms, Decreased strength, Impaired flexibility  Visit Diagnosis: Muscle weakness (generalized) - Plan: PT plan of care cert/re-cert  Other symptoms and signs involving the musculoskeletal system - Plan: PT plan of care cert/re-cert     Problem List Patient Active Problem List   Diagnosis Date Noted  . Essential hypertension 10/02/2015  . Abdominal aortic aneurysm ([place]) 06/22/2014  . Right-sided low back pain with right-sided sciatica 06/22/2014  . Mucosal abnormality of stomach   . Gastric ulcer 04/27/2014  . GERD (gastroesophageal reflux disease) 04/27/2014  . Esophageal dysphagia 11/11/2013  . Abdominal pain, epigastric 11/11/2013  . Back pain 01/15/2013  . Meralgia paresthetica of right side 01/15/2013    Cyril Mourning  Hanley Hays,  DPT [phone]  [facility] [address] [place], [place], [address] Phone: [phone]   Fax:  [phone]  Name: VARDAAN LEIDIG MRN: [id number] Date of Birth: 1955-11-11

## 2016-04-24 ENCOUNTER — Telehealth (HOSPITAL_COMMUNITY): Payer: Self-pay | Admitting: Physical Therapy

## 2016-04-24 NOTE — Telephone Encounter (Signed)
He came by the office to cx this apptment, patient did not state why?

## 2016-04-25 ENCOUNTER — Ambulatory Visit (HOSPITAL_COMMUNITY): Payer: 59 | Admitting: Physical Therapy

## 2016-05-02 ENCOUNTER — Encounter (HOSPITAL_COMMUNITY): Payer: Self-pay

## 2016-05-02 ENCOUNTER — Ambulatory Visit (HOSPITAL_COMMUNITY): Payer: 59

## 2016-05-02 DIAGNOSIS — M6281 Muscle weakness (generalized): Secondary | ICD-10-CM

## 2016-05-02 DIAGNOSIS — R29898 Other symptoms and signs involving the musculoskeletal system: Secondary | ICD-10-CM

## 2016-05-02 NOTE — Patient Instructions (Addendum)
Hamstring Step 3    Left leg in maximal straight leg raise, heel at maximal stretch, straighten knee further by tightening knee cap. Warning: Intense stretch. Stay within tolerance. Hold 30 seconds.  Repeat 3 times.  Copyright  VHI. All rights reserved.   Band Walk: Side Stepping    Tie band around legs, just above knees. Step 12 feet to one side, then step back to start. Note: Small towel between band and skin eases rubbing.  http://plyo.exer.us/76   Copyright  VHI. All rights reserved.   Arm / Leg Extension: Alternate (All-Fours)    Raise right arm and opposite leg. Do not arch neck. Repeat 10 times per set with 5" holds. Do 1-2 sets per session.  http://orth.exer.us/110   Copyright  VHI. All rights reserved.

## 2016-05-02 NOTE — Therapy (Signed)
[place], [place], [address] Phone: [phone]   Fax:  [phone]  Physical Therapy Treatment  Patient Details  Name: Billy Mccormick MRN: [id number] Date of Birth: 04-28-1956 Referring Provider: Baltazar Apo   Encounter Date: 05/02/2016      PT End of Session - 05/02/16 1700    Visit Number 2   Number of Visits 6   Date for PT Re-Evaluation 05/15/16   Authorization Type [place] Time Period 04/17/16 to 05/29/16   PT Start Time 1648   PT Stop Time 1728   PT Time Calculation (min) 40 min   Activity Tolerance Patient tolerated treatment well   Behavior During Therapy [facility] for tasks assessed/performed      Past Medical History:  Diagnosis Date  . Degenerative cervical disc   . Essential hypertension 10/02/2015  . Hyperlipidemia   . IFG (impaired fasting glucose)   . Lumbar herniated disc 2015   L4,L5  . Psoriasis     Past Surgical History:  Procedure Laterality Date  . ACHILLES TENDON REPAIR     right  . COLONOSCOPY  06/25/2006   Dr. Gala Romney: extensive left-sided diverticula, 2 six to seven mm pale-appearing hyperplastic-appearing polypoid lesions vs atypcial appearing lipoma. (bx-hyperplastic polyps). next TCS 06/2016  . ESOPHAGOGASTRODUODENOSCOPY N/A 11/28/2013   [id number] Schatzki's ring  not manipulated. Small hiatal hernia. Gastric ulcer and erosions as above. Bx antrum-extensive intestinal metaplasia, no H.pylori  . ESOPHAGOGASTRODUODENOSCOPY N/A 05/26/2014   Procedure: ESOPHAGOGASTRODUODENOSCOPY (EGD);  Surgeon: Daneil Dolin, MD;  [facility];  Service: Endoscopy;  Laterality: N/A;  7:30AM  . HERNIA REPAIR     x 2  . MALONEY DILATION N/A 11/28/2013   Procedure: Venia Minks DILATION;  Surgeon: Daneil Dolin, MD;  Location: [facility];  Service: Endoscopy;  Laterality: N/A;  . NOSE SURGERY     x 2  . SAVORY DILATION N/A 11/28/2013   Procedure:  SAVORY DILATION;  Surgeon: Daneil Dolin, MD;  Location: [facility];  Service: Endoscopy;  Laterality: N/A;    There were no vitals filed for this visit.      Subjective Assessment - 05/02/16 1649    Subjective Pt stated he is feeling good right now, just feels weak.  Reports compliance with HEP daily, does have some questions concerning form.   Pertinent History Abdominal aortic aneurysm, HTN, GERD, lumbar surgery for ruptured disc, achilles rupture repair    Patient Stated Goals stretch out hamstings, work on core    Currently in Pain? No/denies              [facility] Adult PT Treatment/Exercise - 05/02/16 0001      Bed Mobility   Bed Mobility Sit to Sidelying Left   Sit to Sidelying Left 5: Supervision   Sit to Sidelying Left Details (indicate cue type and reason) Instructed proper bed mobility sidelying to supine 2 reps     Exercises   Exercises Lumbar     Lumbar Exercises: Stretches   Active Hamstring Stretch 3 reps;30 seconds   Active Hamstring Stretch Limitations supine with rope     Lumbar Exercises: Standing   Functional Squats 10 reps   Forward Lunge 10 reps  2 sets   Forward Lunge Limitations 6in step; incorporated UE Flexion wiht 2nd set for core activaiotn   Other Standing Lumbar Exercises sidestep GTB 2RT     Lumbar Exercises: Seated   Other  Seated Lumbar Exercises sitting on dynadisc march and hold 5" 10reps no HHA     Lumbar Exercises: Quadruped   Opposite Arm/Leg Raise Right arm/Left leg;Left arm/Right leg;10 reps;5 seconds                  PT Short Term Goals - 04/17/16 1747      PT SHORT TERM GOAL #1   Title Patient to demonstrate complete resolution of L sided muscle stiffness in order to improve general mechanics with functional tasks    Time 3   Period Weeks   Status New     PT SHORT TERM GOAL #2   Title Patient to be able to verbalize the importance of quality core strength for functional tasks and activities as well as for  support of general lumbar area    Time 3   Period Weeks   Status New     PT SHORT TERM GOAL #3   Title Patient to be independent in correctly and consistently performing appropriate HEP, to be updated PRN    Time 1   Period Weeks   Status New           PT Long Term Goals - 04/17/16 1748      PT LONG TERM GOAL #1   Title Patient to demonstrate strength 5/5 in all tested muscle groups and will demonstrate high level core strength via ability to perform bird-dog exercise in quadruped without ddropping of pelvis in order to improve functional task performance and tolerance    Time 6   Period Weeks   Status New     PT LONG TERM GOAL #2   Title Patient to demonstrate correct functional lifting mechanics in order to prevent exacerbation of lumbar injury and to assist in preventing other injuries to lumbar area in general    Time 6   Period Weeks   Status New     PT LONG TERM GOAL #3   Title Patient to be independent in correctly performing advanced core and LE strengthening program in order to show self-efficacy in managing condition    Time 6   Period Weeks   Status New               Plan - 05/02/16 1700    Clinical Impression Statement Reviewed goals, compliance and assured correct technique with HEP and copy of HEP given to pt.  Pt instructed proper bed mechanics for lumbar support with ability to demonstrate appropriate form and technique following initial cueing.  Pt stated difficulty feeling hamstring stretch in seated position, updated HEP to supine position with positive results following as well as quadruped therex for lumbar stability.  Pt able to perform correct form and technqiue with all therex following initial cueing for form and technique.  No reports of pain through session.     Rehab Potential Excellent   Clinical Impairments Affecting Rehab Potential history of multiple ruptured discs, still on lumbar precautions    PT Frequency 1x / week   PT Duration 6  weeks   PT Treatment/Interventions ADLs/Self Care Home Management;Biofeedback;Cryotherapy;Moist Heat;Functional mobility training;Therapeutic activities;Therapeutic exercise;Balance training;Neuromuscular re-education;Patient/family education;Manual techniques;Passive range of motion;Energy conservation;Taping   PT Next Visit Plan focus on functional LE strength and core, update HEP each visit if appropriate    PT Home Exercise Plan 10/2: single leg bridges, standing hip ABD with red TB, hamstring stretch, seated hip flexion holds 10/17: Supine hamstring stretch and quadruped.      Patient will benefit  from skilled therapeutic intervention in order to improve the following deficits and impairments:  Improper body mechanics, Decreased coordination, Increased muscle spasms, Decreased strength, Impaired flexibility  Visit Diagnosis: Muscle weakness (generalized)  Other symptoms and signs involving the musculoskeletal system     Problem List Patient Active Problem List   Diagnosis Date Noted  . Essential hypertension 10/02/2015  . Abdominal aortic aneurysm ([place]) 06/22/2014  . Right-sided low back pain with right-sided sciatica 06/22/2014  . Mucosal abnormality of stomach   . Gastric ulcer 04/27/2014  . GERD (gastroesophageal reflux disease) 04/27/2014  . Esophageal dysphagia 11/11/2013  . Abdominal pain, epigastric 11/11/2013  . Back pain 01/15/2013  . Meralgia paresthetica of right side 01/15/2013   Ihor Austin, LPTA; CBIS [phone]  Aldona Lento 05/02/2016, 5:46 PM  [place], [place], [address] Phone: [phone]   Fax:  [phone]  Name: Billy Mccormick MRN: [id number] Date of Birth: May 10, 1956

## 2016-05-08 ENCOUNTER — Telehealth (HOSPITAL_COMMUNITY): Payer: Self-pay

## 2016-05-08 NOTE — Telephone Encounter (Signed)
Patient canceled all appts . He is unable to come right now due to other personal affairs.

## 2016-05-09 ENCOUNTER — Ambulatory Visit (HOSPITAL_COMMUNITY): Payer: 59 | Admitting: Physical Therapy

## 2016-05-16 ENCOUNTER — Encounter (HOSPITAL_COMMUNITY): Payer: 59 | Admitting: Physical Therapy

## 2016-05-23 ENCOUNTER — Encounter (HOSPITAL_COMMUNITY): Payer: 59 | Admitting: Physical Therapy

## 2016-05-29 ENCOUNTER — Telehealth: Payer: Self-pay | Admitting: Internal Medicine

## 2016-05-29 NOTE — Telephone Encounter (Signed)
RECALL FOR TCS °

## 2016-05-29 NOTE — Telephone Encounter (Signed)
Letter sent.

## 2016-05-30 ENCOUNTER — Encounter (HOSPITAL_COMMUNITY): Payer: 59 | Admitting: Physical Therapy

## 2016-06-20 ENCOUNTER — Other Ambulatory Visit: Payer: Self-pay | Admitting: Family Medicine

## 2016-06-22 ENCOUNTER — Other Ambulatory Visit: Payer: Self-pay | Admitting: Family Medicine

## 2016-09-08 ENCOUNTER — Encounter: Payer: Self-pay | Admitting: Vascular Surgery

## 2016-09-14 ENCOUNTER — Ambulatory Visit: Payer: 59 | Admitting: Family

## 2016-09-14 ENCOUNTER — Other Ambulatory Visit (HOSPITAL_COMMUNITY): Payer: 59

## 2016-09-15 ENCOUNTER — Other Ambulatory Visit (HOSPITAL_COMMUNITY): Payer: 59

## 2016-09-15 ENCOUNTER — Ambulatory Visit: Payer: 59 | Admitting: Family

## 2016-09-18 ENCOUNTER — Telehealth: Payer: Self-pay | Admitting: Family Medicine

## 2016-09-18 DIAGNOSIS — Z125 Encounter for screening for malignant neoplasm of prostate: Secondary | ICD-10-CM

## 2016-09-18 DIAGNOSIS — I10 Essential (primary) hypertension: Secondary | ICD-10-CM

## 2016-09-18 DIAGNOSIS — Z13220 Encounter for screening for lipoid disorders: Secondary | ICD-10-CM

## 2016-09-18 DIAGNOSIS — Z79899 Other long term (current) drug therapy: Secondary | ICD-10-CM

## 2016-09-18 NOTE — Telephone Encounter (Signed)
Left message return call 09/18/2016 

## 2016-09-18 NOTE — Telephone Encounter (Signed)
Rep same 

## 2016-09-18 NOTE — Telephone Encounter (Signed)
Pt is requesting lab orders. Last labs per epic were: psa,hepatic,lipid,and bmp on 09/11/2015. Pt is needing the orders printed so that he can go to [facility].

## 2016-09-20 ENCOUNTER — Encounter: Payer: 59 | Admitting: Family Medicine

## 2016-09-30 DIAGNOSIS — Z79899 Other long term (current) drug therapy: Secondary | ICD-10-CM | POA: Diagnosis not present

## 2016-09-30 DIAGNOSIS — I10 Essential (primary) hypertension: Secondary | ICD-10-CM | POA: Diagnosis not present

## 2016-09-30 DIAGNOSIS — Z125 Encounter for screening for malignant neoplasm of prostate: Secondary | ICD-10-CM | POA: Diagnosis not present

## 2016-10-01 LAB — LIPID PANEL
CHOL/HDL RATIO: 5.2 ratio — AB (ref 0.0–5.0)
Cholesterol, Total: 215 mg/dL — ABNORMAL HIGH (ref 100–199)
HDL: 41 mg/dL (ref >39)
LDL CALC: 153 mg/dL — AB (ref 0–99)
TRIGLYCERIDES: 104 mg/dL (ref 0–149)
VLDL Cholesterol Cal: 21 mg/dL (ref 5–40)

## 2016-10-01 LAB — BASIC METABOLIC PANEL
BUN/Creatinine Ratio: 12 (ref 10–24)
BUN: 11 mg/dL (ref 8–27)
CALCIUM: 9.5 mg/dL (ref 8.6–10.2)
CO2: 22 mmol/L (ref 18–29)
Chloride: 103 mmol/L (ref 96–106)
Creatinine, Ser: 0.91 mg/dL (ref 0.76–1.27)
GFR calc non Af Amer: 91 mL/min/{1.73_m2} (ref >59)
GFR, EST AFRICAN AMERICAN: 106 mL/min/{1.73_m2} (ref >59)
GLUCOSE: 104 mg/dL — AB (ref 65–99)
Potassium: 5 mmol/L (ref 3.5–5.2)
SODIUM: 142 mmol/L (ref 134–144)

## 2016-10-01 LAB — HEPATIC FUNCTION PANEL
ALBUMIN: 4.4 g/dL (ref 3.6–4.8)
ALT: 24 IU/L (ref 0–44)
AST: 24 IU/L (ref 0–40)
Alkaline Phosphatase: 64 IU/L (ref 39–117)
Bilirubin Total: 0.3 mg/dL (ref 0.0–1.2)
Bilirubin, Direct: 0.09 mg/dL (ref 0.00–0.40)
TOTAL PROTEIN: 7.2 g/dL (ref 6.0–8.5)

## 2016-10-01 LAB — PSA: Prostate Specific Ag, Serum: 0.9 ng/mL (ref 0.0–4.0)

## 2016-10-04 ENCOUNTER — Ambulatory Visit (INDEPENDENT_AMBULATORY_CARE_PROVIDER_SITE_OTHER): Payer: 59 | Admitting: Family Medicine

## 2016-10-04 ENCOUNTER — Encounter: Payer: Self-pay | Admitting: Family Medicine

## 2016-10-04 VITALS — BP 142/88 | Ht 73.25 in | Wt 180.0 lb

## 2016-10-04 DIAGNOSIS — I10 Essential (primary) hypertension: Secondary | ICD-10-CM

## 2016-10-04 DIAGNOSIS — E785 Hyperlipidemia, unspecified: Secondary | ICD-10-CM | POA: Insufficient documentation

## 2016-10-04 DIAGNOSIS — Z0000 Encounter for general adult medical examination without abnormal findings: Secondary | ICD-10-CM | POA: Diagnosis not present

## 2016-10-04 MED ORDER — ENALAPRIL MALEATE 5 MG PO TABS: 5.0000 mg | ORAL_TABLET | Freq: Every day | ORAL | 5 refills | Status: DC

## 2016-10-04 MED ORDER — ATORVASTATIN CALCIUM 20 MG PO TABS: 20.0000 mg | ORAL_TABLET | Freq: Every day | ORAL | 5 refills | Status: DC

## 2016-10-04 NOTE — Patient Instructions (Signed)
Results for orders placed or performed in visit on 09/18/16  PSA  Result Value Ref Range   Prostate Specific Ag, Serum 0.9 0.0 - 4.0 ng/mL  Hepatic function panel  Result Value Ref Range   Total Protein 7.2 6.0 - 8.5 g/dL   Albumin 4.4 3.6 - 4.8 g/dL   Bilirubin Total 0.3 0.0 - 1.2 mg/dL   Bilirubin, Direct 0.09 0.00 - 0.40 mg/dL   Alkaline Phosphatase 64 39 - 117 IU/L   AST 24 0 - 40 IU/L   ALT 24 0 - 44 IU/L  Lipid panel  Result Value Ref Range   Cholesterol, Total 215 (H) 100 - 199 mg/dL   Triglycerides 104 0 - 149 mg/dL   HDL 41 >39 mg/dL   VLDL Cholesterol Cal 21 5 - 40 mg/dL   LDL Calculated 153 (H) 0 - 99 mg/dL   Chol/HDL Ratio 5.2 (H) 0.0 - 5.0 ratio units  Basic metabolic panel  Result Value Ref Range   Glucose 104 (H) 65 - 99 mg/dL   BUN 11 8 - 27 mg/dL   Creatinine, Ser 0.91 0.76 - 1.27 mg/dL   GFR calc non Af Amer 91 >59 mL/min/1.73   GFR calc Af Amer 106 >59 mL/min/1.73   BUN/Creatinine Ratio 12 10 - 24   Sodium 142 134 - 144 mmol/L   Potassium 5.0 3.5 - 5.2 mmol/L   Chloride 103 96 - 106 mmol/L   CO2 22 18 - 29 mmol/L   Calcium 9.5 8.6 - 10.2 mg/dL    Cholesterol Cholesterol is a white, waxy, fat-like substance that is needed by the human body in small amounts. The liver makes all the cholesterol we need. Cholesterol is carried from the liver by the blood through the blood vessels. Deposits of cholesterol (plaques) may build up on blood vessel (artery) walls. Plaques make the arteries narrower and stiffer. Cholesterol plaques increase the risk for heart attack and stroke. You cannot feel your cholesterol level even if it is very high. The only way to know that it is high is to have a blood test. Once you know your cholesterol levels, you should keep a record of the test results. Work with your health care provider to keep your levels in the desired range. What do the results mean?  Total cholesterol is a rough measure of all the cholesterol in your blood.  LDL  (low-density lipoprotein) is the "bad" cholesterol. This is the type that causes plaque to build up on the artery walls. You want this level to be low.  HDL (high-density lipoprotein) is the "good" cholesterol because it cleans the arteries and carries the LDL away. You want this level to be high.  Triglycerides are fat that the body can either burn for energy or store. High levels are closely linked to heart disease. What are the desired levels of cholesterol?  Total cholesterol below 200.  LDL below 100 for people who are at risk, below 70 for people at very high risk.  HDL above 40 is good. A level of 60 or higher is considered to be protective against heart disease.  Triglycerides below 150. How can I lower my cholesterol? Diet  Follow your diet program as told by your health care provider.  Choose fish or white meat chicken and [place], roasted or baked. Limit fatty cuts of red meat, fried foods, and processed meats, such as sausage and lunch meats.  Eat lots of fresh fruits and vegetables.  Choose whole grains, beans,  pasta, potatoes, and cereals.  Choose olive oil, corn oil, or canola oil, and use only small amounts.  Avoid butter, mayonnaise, shortening, or palm kernel oils.  Avoid foods with trans fats.  Drink skim or nonfat milk and eat low-fat or nonfat yogurt and cheeses. Avoid whole milk, cream, ice cream, egg yolks, and full-fat cheeses.  Healthier desserts include angel food cake, ginger snaps, animal crackers, hard candy, popsicles, and low-fat or nonfat frozen yogurt. Avoid pastries, cakes, pies, and cookies. Exercise  Follow your exercise program as told by your health care provider. A regular program:  Helps to decrease LDL and raise HDL.  Helps with weight control.  Do things that increase your activity level, such as gardening, walking, and taking the stairs.  Ask your health care provider about ways that you can be more active in your daily  life. Medicine  Take over-the-counter and prescription medicines only as told by your health care provider.  Medicine may be prescribed by your health care provider to help lower cholesterol and decrease the risk for heart disease. This is usually done if diet and exercise have failed to bring down cholesterol levels.  If you have several risk factors, you may need medicine even if your levels are normal. This information is not intended to replace advice given to you by your health care provider. Make sure you discuss any questions you have with your health care provider. Document Released: 03/28/2001 Document Revised: 01/29/2016 Document Reviewed: 01/01/2016 Elsevier Interactive Patient Education  2017 [employer].  Hypertension Hypertension, commonly called high blood pressure, is when the force of blood pumping through the arteries is too strong. The arteries are the blood vessels that carry blood from the heart throughout the body. Hypertension forces the heart to work harder to pump blood and may cause arteries to become narrow or stiff. Having untreated or uncontrolled hypertension can cause heart attacks, strokes, kidney disease, and other problems. A blood pressure reading consists of a higher number over a lower number. Ideally, your blood pressure should be below 120/80. The first ("top") number is called the systolic pressure. It is a measure of the pressure in your arteries as your heart beats. The second ("bottom") number is called the diastolic pressure. It is a measure of the pressure in your arteries as the heart relaxes. What are the causes? The cause of this condition is not known. What increases the risk? Some risk factors for high blood pressure are under your control. Others are not. Factors you can change   Smoking.  Having type 2 diabetes mellitus, high cholesterol, or both.  Not getting enough exercise or physical activity.  Being overweight.  Having too much  fat, sugar, calories, or salt (sodium) in your diet.  Drinking too much alcohol. Factors that are difficult or impossible to change   Having chronic kidney disease.  Having a family history of high blood pressure.  Age. Risk increases with age.  Race. You may be at higher risk if you are African-American.  Gender. Men are at higher risk than women before age 21. After age 92, women are at higher risk than men.  Having obstructive sleep apnea.  Stress. What are the signs or symptoms? Extremely high blood pressure (hypertensive crisis) may cause:  Headache.  Anxiety.  Shortness of breath.  Nosebleed.  Nausea and vomiting.  Severe chest pain.  Jerky movements you cannot control (seizures). How is this diagnosed? This condition is diagnosed by measuring your blood pressure while you  are seated, with your arm resting on a surface. The cuff of the blood pressure monitor will be placed directly against the skin of your upper arm at the level of your heart. It should be measured at least twice using the same arm. Certain conditions can cause a difference in blood pressure between your right and left arms. Certain factors can cause blood pressure readings to be lower or higher than normal (elevated) for a short period of time:  When your blood pressure is higher when you are in a health care provider's office than when you are at home, this is called white coat hypertension. Most people with this condition do not need medicines.  When your blood pressure is higher at home than when you are in a health care provider's office, this is called masked hypertension. Most people with this condition may need medicines to control blood pressure. If you have a high blood pressure reading during one visit or you have normal blood pressure with other risk factors:  You may be asked to return on a different day to have your blood pressure checked again.  You may be asked to monitor your blood  pressure at home for 1 week or longer. If you are diagnosed with hypertension, you may have other blood or imaging tests to help your health care provider understand your overall risk for other conditions. How is this treated? This condition is treated by making healthy lifestyle changes, such as eating healthy foods, exercising more, and reducing your alcohol intake. Your health care provider may prescribe medicine if lifestyle changes are not enough to get your blood pressure under control, and if:  Your systolic blood pressure is above 130.  Your diastolic blood pressure is above 80. Your personal target blood pressure may vary depending on your medical conditions, your age, and other factors. Follow these instructions at home: Eating and drinking   Eat a diet that is high in fiber and potassium, and low in sodium, added sugar, and fat. An example eating plan is called the DASH (Dietary Approaches to Stop Hypertension) diet. To eat this way:  Eat plenty of fresh fruits and vegetables. Try to fill half of your plate at each meal with fruits and vegetables.  Eat whole grains, such as whole wheat pasta, brown rice, or whole grain bread. Fill about one quarter of your plate with whole grains.  Eat or drink low-fat dairy products, such as skim milk or low-fat yogurt.  Avoid fatty cuts of meat, processed or cured meats, and poultry with skin. Fill about one quarter of your plate with lean proteins, such as fish, chicken without skin, beans, eggs, and tofu.  Avoid premade and processed foods. These tend to be higher in sodium, added sugar, and fat.  Reduce your daily sodium intake. Most people with hypertension should eat less than 1,500 mg of sodium a day.  Limit alcohol intake to no more than 1 drink a day for nonpregnant women and 2 drinks a day for men. One drink equals 12 oz of beer, 5 oz of wine, or 1 oz of hard liquor. Lifestyle   Work with your health care provider to maintain a  healthy body weight or to lose weight. Ask what an ideal weight is for you.  Get at least 30 minutes of exercise that causes your heart to beat faster (aerobic exercise) most days of the week. Activities may include walking, swimming, or biking.  Include exercise to strengthen your muscles (resistance exercise),  such as pilates or lifting weights, as part of your weekly exercise routine. Try to do these types of exercises for 30 minutes at least 3 days a week.  Do not use any products that contain nicotine or tobacco, such as cigarettes and e-cigarettes. If you need help quitting, ask your health care provider.  Monitor your blood pressure at home as told by your health care provider.  Keep all follow-up visits as told by your health care provider. This is important. Medicines   Take over-the-counter and prescription medicines only as told by your health care provider. Follow directions carefully. Blood pressure medicines must be taken as prescribed.  Do not skip doses of blood pressure medicine. Doing this puts you at risk for problems and can make the medicine less effective.  Ask your health care provider about side effects or reactions to medicines that you should watch for. Contact a health care provider if:  You think you are having a reaction to a medicine you are taking.  You have headaches that keep coming back (recurring).  You feel dizzy.  You have swelling in your ankles.  You have trouble with your vision. Get help right away if:  You develop a severe headache or confusion.  You have unusual weakness or numbness.  You feel faint.  You have severe pain in your chest or abdomen.  You vomit repeatedly.  You have trouble breathing. Summary  Hypertension is when the force of blood pumping through your arteries is too strong. If this condition is not controlled, it may put you at risk for serious complications.  Your personal target blood pressure may vary  depending on your medical conditions, your age, and other factors. For most people, a normal blood pressure is less than 120/80.  Hypertension is treated with lifestyle changes, medicines, or a combination of both. Lifestyle changes include weight loss, eating a healthy, low-sodium diet, exercising more, and limiting alcohol. This information is not intended to replace advice given to you by your health care provider. Make sure you discuss any questions you have with your health care provider. Document Released: 07/03/2005 Document Revised: 05/31/2016 Document Reviewed: 05/31/2016 Elsevier Interactive Patient Education  2017 [employer].

## 2016-10-04 NOTE — Progress Notes (Signed)
Subjective:    Patient ID: Billy Mccormick, male    DOB: [birth year], 61 y.o.   MRN: [id number]  HPI The patient comes in today for a wellness visit.  Results for orders placed or performed in visit on 09/18/16  PSA  Result Value Ref Range   Prostate Specific Ag, Serum 0.9 0.0 - 4.0 ng/mL  Hepatic function panel  Result Value Ref Range   Total Protein 7.2 6.0 - 8.5 g/dL   Albumin 4.4 3.6 - 4.8 g/dL   Bilirubin Total 0.3 0.0 - 1.2 mg/dL   Bilirubin, Direct 0.09 0.00 - 0.40 mg/dL   Alkaline Phosphatase 64 39 - 117 IU/L   AST 24 0 - 40 IU/L   ALT 24 0 - 44 IU/L  Lipid panel  Result Value Ref Range   Cholesterol, Total 215 (H) 100 - 199 mg/dL   Triglycerides 104 0 - 149 mg/dL   HDL 41 >39 mg/dL   VLDL Cholesterol Cal 21 5 - 40 mg/dL   LDL Calculated 153 (H) 0 - 99 mg/dL   Chol/HDL Ratio 5.2 (H) 0.0 - 5.0 ratio units  Basic metabolic panel  Result Value Ref Range   Glucose 104 (H) 65 - 99 mg/dL   BUN 11 8 - 27 mg/dL   Creatinine, Ser 0.91 0.76 - 1.27 mg/dL   GFR calc non Af Amer 91 >59 mL/min/1.73   GFR calc Af Amer 106 >59 mL/min/1.73   BUN/Creatinine Ratio 12 10 - 24   Sodium 142 134 - 144 mmol/L   Potassium 5.0 3.5 - 5.2 mmol/L   Chloride 103 96 - 106 mmol/L   CO2 22 18 - 29 mmol/L   Calcium 9.5 8.6 - 10.2 mg/dL    A review of their health history was completed.  A review of medications was also completed.  Any needed refills; protonix  Eating habits: not always health conscious - sometimes  Falls/  MVA accidents in past few months: none  Regular exercise: no  Specialist pt sees on regular basis: none  Preventative health issues were discussed.   Additional concerns: none  Still smoking  Long review blood pressures with the past 2 years. He has elevated blood pressures. Continues to smoke. Positive family history of high blood pressure. Aortic aneurysm present which also is another concern in regards to numbers  Not exercisign ' Review of Systems    Constitutional: Negative for activity change, appetite change and fever.  HENT: Negative for congestion and rhinorrhea.   Eyes: Negative for discharge.  Respiratory: Negative for cough and wheezing.   Cardiovascular: Negative for chest pain.  Gastrointestinal: Negative for abdominal pain, blood in stool and vomiting.  Genitourinary: Negative for difficulty urinating and frequency.  Musculoskeletal: Negative for neck pain.  Skin: Negative for rash.  Allergic/Immunologic: Negative for environmental allergies and food allergies.  Neurological: Negative for weakness and headaches.  Psychiatric/Behavioral: Negative for agitation.  All other systems reviewed and are negative.      Objective:   Physical Exam  Constitutional: He appears well-developed and well-nourished.  HENT:  Head: Normocephalic and atraumatic.  Right Ear: External ear normal.  Left Ear: External ear normal.  Nose: Nose normal.  Mouth/Throat: Oropharynx is clear and moist.  Eyes: EOM are normal. Pupils are equal, round, and reactive to light.  Neck: Normal range of motion. Neck supple. No thyromegaly present.  Cardiovascular: Normal rate, regular rhythm and normal heart sounds.   No murmur heard. Pulmonary/Chest: Effort normal and breath sounds  normal. No respiratory distress. He has no wheezes.  Abdominal: Soft. Bowel sounds are normal. He exhibits no distension and no mass. There is no tenderness.  Genitourinary: Penis normal.  Musculoskeletal: Normal range of motion. He exhibits no edema.  Lymphadenopathy:    He has no cervical adenopathy.  Neurological: He is alert. He exhibits normal muscle tone.  Skin: Skin is warm and dry. No erythema.  Psychiatric: He has a normal mood and affect. His behavior is normal. Judgment normal.  Vitals reviewed.         Assessment & Plan:  Impression wellness exam diet exercise discussed smoking cessation discussed #2 abdominal aorta followed by specialist #3 hypertension  long discussion held time the press on with medications #4 hyperlipidemia LDL too high for risk factors long discussion held time the press on plan initiate enalapril. Initiate Lipitor. Diet exercise discussed. Referral for low-dose CT monitoring rationale discussed at length patient in agreement follow-up in several months blood work one week prior

## 2016-10-05 ENCOUNTER — Telehealth: Payer: Self-pay | Admitting: *Deleted

## 2016-10-05 ENCOUNTER — Other Ambulatory Visit: Payer: Self-pay | Admitting: *Deleted

## 2016-10-05 DIAGNOSIS — Z87891 Personal history of nicotine dependence: Secondary | ICD-10-CM

## 2016-10-05 NOTE — Telephone Encounter (Signed)
Left message to return call 

## 2016-10-05 NOTE — Telephone Encounter (Signed)
Pt in office yesterday. Dr Richardson Landry ordered ct low dose scan for chronic smoker. I told pt that I would schedule and call him back today but sarah groce is the one who schedules and precerts test. Order has been put in. Need to let pt know that Judson Roch will be calling him back with appt.

## 2016-10-09 NOTE — Telephone Encounter (Signed)
Discussed with pt. Pt to call back if he does not hear from [facility] about the scan so we can check on it.

## 2016-10-17 ENCOUNTER — Telehealth: Payer: Self-pay | Admitting: Acute Care

## 2016-10-17 DIAGNOSIS — F17210 Nicotine dependence, cigarettes, uncomplicated: Secondary | ICD-10-CM

## 2016-10-18 ENCOUNTER — Encounter: Payer: Self-pay | Admitting: Family

## 2016-10-18 NOTE — Telephone Encounter (Signed)
Spoke with pt's wife and scheduled pt for [facility] 10/27/16 4:00 CT ordered Nothing further needed

## 2016-10-25 ENCOUNTER — Telehealth: Payer: Self-pay | Admitting: Acute Care

## 2016-10-26 ENCOUNTER — Ambulatory Visit (INDEPENDENT_AMBULATORY_CARE_PROVIDER_SITE_OTHER): Payer: 59 | Admitting: Family

## 2016-10-26 ENCOUNTER — Ambulatory Visit (HOSPITAL_COMMUNITY)
Admission: RE | Admit: 2016-10-26 | Discharge: 2016-10-26 | Disposition: A | Discharge status: Home / Self Care | Payer: 59 | Source: Ambulatory Visit | Attending: Vascular Surgery | Admitting: Vascular Surgery

## 2016-10-26 ENCOUNTER — Encounter: Payer: Self-pay | Admitting: Family

## 2016-10-26 VITALS — BP 120/78 | HR 64 | Temp 98.9°F | Resp 20 | Ht 73.25 in | Wt 180.0 lb

## 2016-10-26 DIAGNOSIS — I7409 Other arterial embolism and thrombosis of abdominal aorta: Secondary | ICD-10-CM | POA: Diagnosis not present

## 2016-10-26 DIAGNOSIS — F17200 Nicotine dependence, unspecified, uncomplicated: Secondary | ICD-10-CM | POA: Diagnosis not present

## 2016-10-26 DIAGNOSIS — I714 Abdominal aortic aneurysm, without rupture, unspecified: Secondary | ICD-10-CM

## 2016-10-26 NOTE — Patient Instructions (Addendum)
Abdominal Aortic Aneurysm Blood pumps away from the heart through tubes (blood vessels) called arteries. Aneurysms are weak or damaged places in the wall of an artery. It bulges out like a balloon. An abdominal aortic aneurysm happens in the main artery of the body (aorta). It can burst or tear, causing bleeding inside the body. This is an emergency. It needs treatment right away. What are the causes? The exact cause is unknown. Things that could cause this problem include:  Fat and other substances building up in the lining of a tube.  Swelling of the walls of a blood vessel.  Certain tissue diseases.  Belly (abdominal) trauma.  An infection in the main artery of the body. What increases the risk? There are things that make it more likely for you to have an aneurysm. These include:  Being over the age of 60 years old.  Having high blood pressure (hypertension).  Being a male.  Being white.  Being very overweight (obese).  Having a family history of aneurysm.  Using tobacco products. What are the signs or symptoms? Symptoms depend on the size of the aneurysm and how fast it grows. There may not be symptoms. If symptoms occur, they can include:  Pain (belly, side, lower back, or groin).  Feeling full after eating a small amount of food.  Feeling sick to your stomach (nauseous), throwing up (vomiting), or both.  Feeling a lump in your belly that feels like it is beating (pulsating).  Feeling like you will pass out (faint). How is this treated?  Medicine to control blood pressure and pain.  Imaging tests to see if the aneurysm gets bigger.  Surgery. How is this prevented? To lessen your chance of getting this condition:  Stop smoking. Stop chewing tobacco.  Limit or avoid alcohol.  Keep your blood pressure, blood sugar, and cholesterol within normal limits.  Eat less salt.  Eat foods low in saturated fats and cholesterol. These are found in animal and whole  dairy products.  Eat more fiber. Fiber is found in whole grains, vegetables, and fruits.  Keep a healthy weight.  Stay active and exercise often. This information is not intended to replace advice given to you by your health care provider. Make sure you discuss any questions you have with your health care provider. Document Released: 10/28/2012 Document Revised: 12/09/2015 Document Reviewed: 08/02/2012 Elsevier Interactive Patient Education  2017 Elsevier Inc.      Steps to Quit Smoking Smoking tobacco can be bad for your health. It can also affect almost every organ in your body. Smoking puts you and people around you at risk for many serious long-lasting (chronic) diseases. Quitting smoking is hard, but it is one of the best things that you can do for your health. It is never too late to quit. What are the benefits of quitting smoking? When you quit smoking, you lower your risk for getting serious diseases and conditions. They can include:  Lung cancer or lung disease.  Heart disease.  Stroke.  Heart attack.  Not being able to have children (infertility).  Weak bones (osteoporosis) and broken bones (fractures). If you have coughing, wheezing, and shortness of breath, those symptoms may get better when you quit. You may also get sick less often. If you are pregnant, quitting smoking can help to lower your chances of having a baby of low birth weight. What can I do to help me quit smoking? Talk with your doctor about what can help you quit smoking. Some   things you can do (strategies) include:  Quitting smoking totally, instead of slowly cutting back how much you smoke over a period of time.  Going to in-person counseling. You are more likely to quit if you go to many counseling sessions.  Using resources and support systems, such as:  Online chats with a counselor.  Phone quitlines.  Printed self-help materials.  Support groups or group counseling.  Text messaging  programs.  Mobile phone apps or applications.  Taking medicines. Some of these medicines may have nicotine in them. If you are pregnant or breastfeeding, do not take any medicines to quit smoking unless your doctor says it is okay. Talk with your doctor about counseling or other things that can help you. Talk with your doctor about using more than one strategy at the same time, such as taking medicines while you are also going to in-person counseling. This can help make quitting easier. What things can I do to make it easier to quit? Quitting smoking might feel very hard at first, but there is a lot that you can do to make it easier. Take these steps:  Talk to your family and friends. Ask them to support and encourage you.  Call phone quitlines, reach out to support groups, or work with a counselor.  Ask people who smoke to not smoke around you.  Avoid places that make you want (trigger) to smoke, such as:  Bars.  Parties.  Smoke-break areas at work.  Spend time with people who do not smoke.  Lower the stress in your life. Stress can make you want to smoke. Try these things to help your stress:  Getting regular exercise.  Deep-breathing exercises.  Yoga.  Meditating.  Doing a body scan. To do this, close your eyes, focus on one area of your body at a time from head to toe, and notice which parts of your body are tense. Try to relax the muscles in those areas.  Download or buy apps on your mobile phone or tablet that can help you stick to your quit plan. There are many free apps, such as QuitGuide from the CDC (Centers for Disease Control and Prevention). You can find more support from smokefree.gov and other websites. This information is not intended to replace advice given to you by your health care provider. Make sure you discuss any questions you have with your health care provider. Document Released: 04/29/2009 Document Revised: 02/29/2016 Document Reviewed:  11/17/2014 Elsevier Interactive Patient Education  2017 Elsevier Inc.  

## 2016-10-26 NOTE — Telephone Encounter (Signed)
Will forward to the lung screening pool 

## 2016-10-26 NOTE — Progress Notes (Signed)
[facility] [place]   CC: Follow up Abdominal Aortic Aneurysm  History of Present Illness  Billy Mccormick is a 61 y.o. (12-Jan-1956) male whom Dr. Donnetta Hutching has been monitoring with an asymptomatic abdominal aortic aneurysm. This was discovered incidentally on lumbar spine  MRI in 2016. Maximal diameter that time was 3.9 cm. He had an ultrasound in our office on 08/11/2014 predicting 3.3 cm maximal diameter. He has no symptoms referable to this.   The patient denies claudication in legs with walking. The patient history of stroke or TIA symptoms.  Pt Diabetic: No Pt smoker: smoker  (1 ppd, started at age 10 yrs)  Past Medical History:  Diagnosis Date  . Degenerative cervical disc   . Essential hypertension 10/02/2015  . Hyperlipidemia   . IFG (impaired fasting glucose)   . Lumbar herniated disc 2015   L4,L5  . Psoriasis    Past Surgical History:  Procedure Laterality Date  . ACHILLES TENDON REPAIR     right  . COLONOSCOPY  06/25/2006   Dr. Gala Romney: extensive left-sided diverticula, 2 six to seven mm pale-appearing hyperplastic-appearing polypoid lesions vs atypcial appearing lipoma. (bx-hyperplastic polyps). next TCS 06/2016  . ESOPHAGOGASTRODUODENOSCOPY N/A 11/28/2013   GBT:[id number] Schatzki's ring  not manipulated. Small hiatal hernia. Gastric ulcer and erosions as above. Bx antrum-extensive intestinal metaplasia, no H.pylori  . ESOPHAGOGASTRODUODENOSCOPY N/A 05/26/2014   Procedure: ESOPHAGOGASTRODUODENOSCOPY (EGD);  Surgeon: Daneil Dolin, MD;  Location: [facility];  Service: Endoscopy;  Laterality: N/A;  7:30AM  . HERNIA REPAIR     x 2  . MALONEY DILATION N/A 11/28/2013   Procedure: Venia Minks DILATION;  Surgeon: Daneil Dolin, MD;  Location: [facility];  Service: Endoscopy;  Laterality: N/A;  . NOSE SURGERY     x 2  . SAVORY DILATION N/A 11/28/2013   Procedure: SAVORY DILATION;  Surgeon: Daneil Dolin, MD;  Location: [facility];  Service:  Endoscopy;  Laterality: N/A;   Social History Social History   Social History  . Marital status: Married    Spouse name: N/A  . Number of children: 0  . Years of education: N/A   Occupational History  . Not on file.   Social History Main Topics  . Smoking status: Current Every Day Smoker    Packs/day: 1.00    Years: 98.00    Types: Cigarettes  . Smokeless tobacco: Never Used     Comment: currently smokes 3/4 pack daily   . Alcohol use 6.0 oz/week    7 Cans of beer, 3 Shots of liquor per week     Comment: 6 beers about 3 days weekly  . Drug use: No  . Sexual activity: Not on file   Other Topics Concern  . Not on file   Social History Narrative  . No narrative on file   Family History Family History  Problem Relation Age of Onset  . Hyperlipidemia Mother   . Hyperlipidemia Father   . Kidney disease Brother   . Colon cancer Neg Hx     Current Outpatient Prescriptions on File Prior to Visit  Medication Sig Dispense Refill  . atorvastatin (LIPITOR) 20 MG tablet Take 1 tablet (20 mg total) by mouth daily. 30 tablet 5  . enalapril (VASOTEC) 5 MG tablet Take 1 tablet (5 mg total) by mouth daily. 30 tablet 5  . pantoprazole (PROTONIX) 40 MG tablet take 1 tablet once daily 30 tablet 5   No current facility-administered medications  on file prior to visit.    Allergies  Allergen Reactions  . Prednisone     Side effects    ROS: See HPI for pertinent positives and negatives.  Physical Examination  Vitals:   10/26/16 0849  BP: 120/78  Pulse: 64  Resp: 20  Temp: 98.9 F (37.2 C)  TempSrc: Oral  SpO2: 97%  Weight: 180 lb (81.6 kg)  Height: 6' 1.25" (1.861 m)   Body mass index is 23.59 kg/m.  General: A&O x 3, WD, male.  Pulmonary: Sym exp, respirations are non labored, good air movt, CTAB, no rales, rhonchi, or wheezing.  Cardiac: RRR, Nl S1, S2, no detected murmur.   Carotid Bruits Right Left   Negative Negative   Aorta is not palpable Radial pulses  are 2+ palpable and =                          VASCULAR EXAM:                                                                                                         LE Pulses Right Left       FEMORAL  2+ palpable  2+ palpable        POPLITEAL  not palpable   1+ palpable       POSTERIOR TIBIAL  2+ palpable   2+ palpable        DORSALIS PEDIS      ANTERIOR TIBIAL not palpable  not palpable      Gastrointestinal: soft, NTND, -G/R, - HSM, - masses palpated, - CVAT B.  Musculoskeletal: M/S 5/5 throughout, Extremities without ischemic changes.  Neurologic: CN 2-12 intact, Pain and light touch intact in extremities are intact, Motor exam as listed above.  Non-Invasive Vascular Imaging  AAA Duplex (10/26/2016)  Previous size: 4.0 cm (Date: 09-14-15)  Current size:  4.3 cm (Date: 10/26/16) Right CIA: 1.0 cm; Left CIA: 1.0 cm  Medical Decision Making  The patient is a 61 y.o. male who presents with asymptomatic AAA with no significant (3 mm) increase in size in 13 months.  The patient was counseled re smoking cessation and given several free resources re smoking cessation.   Based on this patient's exam and diagnostic studies, the patient will follow up in 1 year  with the following studies: AAA duplex.  The patient was counseled re smoking cessation and given several free resources re smoking cessation.  Consideration for repair of AAA would be made when the size is 5.0 cm, growth > 1 cm/yr, and symptomatic status.  I emphasized the importance of maximal medical management including strict control of blood pressure, blood glucose, and lipid levels, antiplatelet agents, obtaining regular exercise, and cessation of smoking.   The patient was given information about AAA including signs, symptoms, treatment, and how to minimize the risk of enlargement and rupture of aneurysms.    The patient was advised to call 911 should the patient experience sudden onset abdominal or back pain.    Thank you  for allowing [place] to participate in this patient's care.  Clemon Chambers, RN, MSN, FNP-C [facility] Office: [facility] Physician: Oneida Alar  10/26/2016, 8:53 AM

## 2016-10-27 ENCOUNTER — Inpatient Hospital Stay: Admission: RE | Admit: 2016-10-27 | Payer: 59 | Source: Ambulatory Visit

## 2016-10-27 ENCOUNTER — Encounter: Payer: 59 | Admitting: Acute Care

## 2016-10-27 NOTE — Addendum Note (Signed)
Addended by: Lianne Cure A on: 10/27/2016 09:29 AM   Modules accepted: Orders

## 2016-10-30 NOTE — Telephone Encounter (Signed)
Spoke with pt and rescheduled [facility] 11/03/16 2:30 [place] to reschedule CT

## 2016-11-03 ENCOUNTER — Encounter: Payer: Self-pay | Admitting: Acute Care

## 2016-11-03 ENCOUNTER — Ambulatory Visit (INDEPENDENT_AMBULATORY_CARE_PROVIDER_SITE_OTHER): Payer: 59 | Admitting: Acute Care

## 2016-11-03 ENCOUNTER — Ambulatory Visit (INDEPENDENT_AMBULATORY_CARE_PROVIDER_SITE_OTHER)
Admission: RE | Admit: 2016-11-03 | Discharge: 2016-11-03 | Disposition: A | Discharge status: Home / Self Care | Payer: 59 | Source: Ambulatory Visit | Attending: Acute Care | Admitting: Acute Care

## 2016-11-03 DIAGNOSIS — Z87891 Personal history of nicotine dependence: Secondary | ICD-10-CM | POA: Diagnosis not present

## 2016-11-03 DIAGNOSIS — F17210 Nicotine dependence, cigarettes, uncomplicated: Secondary | ICD-10-CM

## 2016-11-03 NOTE — Progress Notes (Signed)
Shared Decision Making Visit Lung Cancer Screening Program [phone])   Eligibility:  Age 61 y.o.  Pack Years Smoking History Calculation 35 pack year smoking history (# packs/per year x # years smoked)  Recent History of coughing up blood  no  Unexplained weight loss? no ( >Than 15 pounds within the last 6 months )  Prior History Lung / other cancer no (Diagnosis within the last 5 years already requiring surveillance chest CT Scans).  Smoking Status Current Smoker  Former Smokers: Years since quit: NA  Quit Date: NA  Visit Components:  Discussion included one or more decision making aids. yes  Discussion included risk/benefits of screening. yes  Discussion included potential follow up diagnostic testing for abnormal scans. yes  Discussion included meaning and risk of over diagnosis. yes  Discussion included meaning and risk of False Positives. yes  Discussion included meaning of total radiation exposure. yes  Counseling Included:  Importance of adherence to annual lung cancer LDCT screening. yes  Impact of comorbidities on ability to participate in the program. yes  Ability and willingness to under diagnostic treatment. yes  Smoking Cessation Counseling:  Current Smokers:   Discussed importance of smoking cessation. yes  Information about tobacco cessation classes and interventions provided to patient. yes  Patient provided with "ticket" for LDCT Scan. yes  Symptomatic Patient. no  Counseling  Diagnosis Code: Tobacco Use Z72.0  Asymptomatic Patient yes  Counseling (Intermediate counseling: > three minutes counseling) X2119  Former Smokers:   Discussed the importance of maintaining cigarette abstinence. yes  Diagnosis Code: Personal History of Nicotine Dependence. E17.408  Information about tobacco cessation classes and interventions provided to patient. Yes  Patient provided with "ticket" for LDCT Scan. yes  Written Order for Lung Cancer  Screening with LDCT placed in Epic. Yes (CT Chest Lung Cancer Screening Low Dose W/O CM) [id number] Z12.2-Screening of respiratory organs Z87.891-Personal history of nicotine dependence   I have spent 25 minutes of face to face time with Billy Mccormick discussing the risks and benefits of lung cancer screening. We viewed a power point together that explained in detail the above noted topics. We paused at intervals to allow for questions to be asked and answered to ensure understanding.We discussed that the single most powerful action that he can take to decrease his risk of developing lung cancer is to quit smoking. We discussed whether or not he is ready to commit to setting a quit date. He is currently not ready to set a quit date, however he is working on reduced to quit. We discussed options for tools to aid in quitting smoking including nicotine replacement therapy, non-nicotine medications, support groups, Quit Smart classes, and behavior modification. We discussed that often times setting smaller, more achievable goals, such as eliminating 1 cigarette a day for a week and then 2 cigarettes a day for a week can be helpful in slowly decreasing the number of cigarettes smoked. This allows for a sense of accomplishment as well as providing a clinical benefit. I gave Billy Mccormick the " Be Stronger Than Your Excuses" card with contact information for community resources, classes, free nicotine replacement therapy, and access to mobile apps, text messaging, and on-line smoking cessation help. I have also offered him my card and contact information in the event he needs to contact me. We discussed the time and location of the scan, and that either Doroteo Glassman RN or I will call with the results within 24-48 hours of receiving them. I have provided him  with a copy of the power point we viewed  as a resource in the event they need reinforcement of the concepts we discussed today in the office. The patient verbalized  understanding of all of  the above and had no further questions upon leaving the office. They have my contact information in the event they have any further questions.  I spent 3-4 minutes of today's visit counseling on smoking cessation.  I explained that there has been a high incidence of coronary artery disease noted on these scans. I explained that this is a non-gated exam therefore degree or severity could not be determined. This patient is currently not on statin therapy. I explained that we will fax results to his primary care provider he can counsel him on necessary follow-up of diet or medication as therapy depending upon his findings. He verbalized understanding of the above and had no questions upon completion of the visit.   Billy Spatz, NP 11/03/2016

## 2016-11-08 ENCOUNTER — Other Ambulatory Visit: Payer: Self-pay | Admitting: Acute Care

## 2016-11-08 DIAGNOSIS — F17210 Nicotine dependence, cigarettes, uncomplicated: Secondary | ICD-10-CM

## 2016-12-23 DIAGNOSIS — Z0000 Encounter for general adult medical examination without abnormal findings: Secondary | ICD-10-CM | POA: Diagnosis not present

## 2016-12-24 ENCOUNTER — Encounter: Payer: Self-pay | Admitting: Family Medicine

## 2016-12-24 LAB — HEPATIC FUNCTION PANEL
ALK PHOS: 64 IU/L (ref 39–117)
ALT: 17 IU/L (ref 0–44)
AST: 24 IU/L (ref 0–40)
Albumin: 4.5 g/dL (ref 3.6–4.8)
BILIRUBIN, DIRECT: 0.05 mg/dL (ref 0.00–0.40)
Bilirubin Total: 0.2 mg/dL (ref 0.0–1.2)
TOTAL PROTEIN: 7.2 g/dL (ref 6.0–8.5)

## 2016-12-24 LAB — LIPID PANEL
Chol/HDL Ratio: 5.1 ratio — ABNORMAL HIGH (ref 0.0–5.0)
Cholesterol, Total: 195 mg/dL (ref 100–199)
HDL: 38 mg/dL — AB (ref >39)
LDL Calculated: 126 mg/dL — ABNORMAL HIGH (ref 0–99)
TRIGLYCERIDES: 156 mg/dL — AB (ref 0–149)
VLDL Cholesterol Cal: 31 mg/dL (ref 5–40)

## 2017-01-03 ENCOUNTER — Encounter (HOSPITAL_COMMUNITY): Payer: Self-pay | Admitting: Physical Therapy

## 2017-01-03 NOTE — Therapy (Signed)
[place], [place], [address] Phone: [phone]   Fax:  [phone]  Patient Details  Name: Billy Mccormick MRN: [id number] Date of Birth: 1956-02-08 Referring Provider:  No ref. provider found  Encounter Date: 01/03/2017   PHYSICAL THERAPY DISCHARGE SUMMARY  Visits from Start of Care: 2  Current functional level related to goals / functional outcomes: Patient has not returned since last skilled session    Remaining deficits: Unable to assess    Education / Equipment: N/A  Plan: Patient agrees to discharge.  Patient goals were not met. Patient is being discharged due to not returning since the last visit.  ?????     Deniece Ree PT, DPT [facility] [address] [place], [place], [address] Phone: [phone]   Fax:  [phone]

## 2017-01-29 ENCOUNTER — Encounter: Payer: Self-pay | Admitting: Family Medicine

## 2017-01-29 ENCOUNTER — Ambulatory Visit (INDEPENDENT_AMBULATORY_CARE_PROVIDER_SITE_OTHER): Payer: 59 | Admitting: Family Medicine

## 2017-01-29 VITALS — BP 114/76 | Ht 73.0 in | Wt 178.5 lb

## 2017-01-29 DIAGNOSIS — E785 Hyperlipidemia, unspecified: Secondary | ICD-10-CM

## 2017-01-29 DIAGNOSIS — I10 Essential (primary) hypertension: Secondary | ICD-10-CM | POA: Diagnosis not present

## 2017-01-29 DIAGNOSIS — I714 Abdominal aortic aneurysm, without rupture, unspecified: Secondary | ICD-10-CM

## 2017-01-29 NOTE — Progress Notes (Addendum)
   Subjective:    Patient ID: Billy Mccormick, male    DOB: 21-Sep-1955, 61 y.o.   MRN: [id number]  Hyperlipidemia  This is a chronic problem. The current episode started more than 1 year ago.   Patient states no concerns this visit  Results for orders placed or performed in visit on 10/04/16  Lipid panel  Result Value Ref Range   Cholesterol, Total 195 100 - 199 mg/dL   Triglycerides 156 (H) 0 - 149 mg/dL   HDL 38 (L) >39 mg/dL   VLDL Cholesterol Cal 31 5 - 40 mg/dL   LDL Calculated 126 (H) 0 - 99 mg/dL   Chol/HDL Ratio 5.1 (H) 0.0 - 5.0 ratio  Hepatic function panel  Result Value Ref Range   Total Protein 7.2 6.0 - 8.5 g/dL   Albumin 4.5 3.6 - 4.8 g/dL   Bilirubin Total 0.2 0.0 - 1.2 mg/dL   Bilirubin, Direct 0.05 0.00 - 0.40 mg/dL   Alkaline Phosphatase 64 39 - 117 IU/L   AST 24 0 - 40 IU/L   ALT 17 0 - 44 IU/L   Blood pressure medicine and blood pressure levels reviewed today with patient. Compliant with blood pressure medicine. States does not miss a dose. No obvious side effects. Blood pressure generally good when checked elsewhere. Watching salt intake.   Patient Elected not to take lipid medication. Notes he never started it. Lipid profile somewhat improved Prior blood work results are reviewed with patient. Patient continues to work on fat intake in diet   Review of Systems No headache, no major weight loss or weight gain, no chest pain no back pain abdominal pain no change in bowel habits complete ROS otherwise negative     Objective:   Physical Exam  Alert vitals stable, NAD. Blood pressure good on repeat. HEENT normal. Lungs clear. Heart regular rate and rhythm.       Assessment & Plan:  Impression hypertension significantly improved #2 hyperlipidemia mild improvement 3 abdominal aortic aneurysm reports reviewed. #4 chronic smoker with negative CT of chest as far as suspicious lesions. Discussed. Long discussion held. Patient has decided to hold off on lipid  medication. I think that is not in his best interest but I'll honor his decision. Discussed at length follow-up in 6 months  Greater than 50% of this 25 minute face to face visit was spent in counseling and discussion and coordination of care regarding the above diagnosis/diagnosies

## 2017-01-30 NOTE — Addendum Note (Signed)
Addended by: Mikey Kirschner on: 01/30/2017 10:49 PM   Modules accepted: Level of Service

## 2017-05-10 ENCOUNTER — Telehealth: Payer: Self-pay | Admitting: General Practice

## 2017-05-10 NOTE — Telephone Encounter (Signed)
Patient's wife called, it's time for his repeat tcs.  He's not having any GI problems or taking any blood thinners.  You can reach the patient at [phone] or [phone].

## 2017-05-14 ENCOUNTER — Telehealth: Payer: Self-pay

## 2017-05-14 NOTE — Telephone Encounter (Signed)
See separate triage.  

## 2017-05-16 ENCOUNTER — Other Ambulatory Visit: Payer: Self-pay | Admitting: Family Medicine

## 2017-05-16 NOTE — Telephone Encounter (Addendum)
Gastroenterology Pre-Procedure Review  Request Date: 05/14/2017 Requesting Physician: ON RECALL  PATIENT REVIEW QUESTIONS: The patient responded to the following health history questions as indicated:    Last colonoscopy was 06/25/2006 by Dr. Gala Romney  1. Diabetes Melitis: no 2. Joint replacements in the past 12 months: no 3. Major health problems in the past 3 months: no 4. Has an artificial valve or MVP: no 5. Has a defibrillator: no 6. Has been advised in past to take antibiotics in advance of a procedure like teeth cleaning: no 7. Family history of colon cancer: no  8. Alcohol Use: 3-4 beers once on weekends 9. History of sleep apnea: no  10. History of coronary artery or other vascular stents placed within the last 12 months: no 11. History of any prior anesthesia complications: no    MEDICATIONS & ALLERGIES:    Patient reports the following regarding taking any blood thinners:   Plavix? no Aspirin? no Coumadin? no Brilinta? no Xarelto? no Eliquis? no Pradaxa? no Savaysa? no Effient? no  Patient confirms/reports the following medications:  Current Outpatient Prescriptions  Medication Sig Dispense Refill  . pantoprazole (PROTONIX) 40 MG tablet take 1 tablet once daily 30 tablet 5  . enalapril (VASOTEC) 5 MG tablet take 1 tablet once daily 30 tablet 5   No current facility-administered medications for this visit.     Patient confirms/reports the following allergies:  Allergies  Allergen Reactions  . Prednisone     Side effects    No orders of the defined types were placed in this encounter.   AUTHORIZATION INFORMATION Primary Insurance:   ID #:  Group #:  Pre-Cert / Auth required:  Pre-Cert / Auth #:   Secondary Insurance:  ID #:  Group #:  Pre-Cert / Auth required: Pre-Cert / Auth #:   SCHEDULE INFORMATION: Procedure has been scheduled as follows:  Date:  06/21/2017              Time: 9:45 AM Location: [facility]  This  Gastroenterology Pre-Precedure Review Form is being routed to the following provider(s): R. Garfield Cornea, MD

## 2017-05-17 ENCOUNTER — Other Ambulatory Visit: Payer: Self-pay

## 2017-05-17 DIAGNOSIS — Z1211 Encounter for screening for malignant neoplasm of colon: Secondary | ICD-10-CM

## 2017-05-17 MED ORDER — NA SULFATE-K SULFATE-MG SULF 17.5-3.13-1.6 GM/177ML PO SOLN: 1.0000 | ORAL | 0 refills | Status: DC

## 2017-05-17 NOTE — Telephone Encounter (Signed)
Ok to schedule.

## 2017-05-17 NOTE — Telephone Encounter (Signed)
Rx sent to the pharmacy and instructions mailed to pt.  

## 2017-06-20 ENCOUNTER — Telehealth: Payer: Self-pay

## 2017-06-20 NOTE — Telephone Encounter (Addendum)
I called [employer] and spoke to [place] and [place] for the colonoscopy is [id number].  Theadora Rama is aware at the [facility].

## 2017-06-21 ENCOUNTER — Other Ambulatory Visit: Payer: Self-pay

## 2017-06-21 ENCOUNTER — Encounter (HOSPITAL_COMMUNITY): Payer: Self-pay | Admitting: *Deleted

## 2017-06-21 ENCOUNTER — Ambulatory Visit (HOSPITAL_COMMUNITY)
Admission: RE | Admit: 2017-06-21 | Discharge: 2017-06-21 | Disposition: A | Discharge status: Home / Self Care | Payer: 59 | Source: Ambulatory Visit | Attending: Internal Medicine | Admitting: Internal Medicine

## 2017-06-21 ENCOUNTER — Encounter (HOSPITAL_COMMUNITY)
Admission: RE | Disposition: A | Discharge status: Home / Self Care | Payer: Self-pay | Source: Ambulatory Visit | Attending: Internal Medicine

## 2017-06-21 DIAGNOSIS — Z8719 Personal history of other diseases of the digestive system: Secondary | ICD-10-CM | POA: Insufficient documentation

## 2017-06-21 DIAGNOSIS — K635 Polyp of colon: Secondary | ICD-10-CM | POA: Diagnosis not present

## 2017-06-21 DIAGNOSIS — I10 Essential (primary) hypertension: Secondary | ICD-10-CM | POA: Diagnosis not present

## 2017-06-21 DIAGNOSIS — K5730 Diverticulosis of large intestine without perforation or abscess without bleeding: Secondary | ICD-10-CM | POA: Insufficient documentation

## 2017-06-21 DIAGNOSIS — K621 Rectal polyp: Secondary | ICD-10-CM | POA: Diagnosis not present

## 2017-06-21 DIAGNOSIS — Z1211 Encounter for screening for malignant neoplasm of colon: Secondary | ICD-10-CM

## 2017-06-21 DIAGNOSIS — F17210 Nicotine dependence, cigarettes, uncomplicated: Secondary | ICD-10-CM | POA: Diagnosis not present

## 2017-06-21 DIAGNOSIS — L409 Psoriasis, unspecified: Secondary | ICD-10-CM | POA: Diagnosis not present

## 2017-06-21 DIAGNOSIS — E785 Hyperlipidemia, unspecified: Secondary | ICD-10-CM | POA: Insufficient documentation

## 2017-06-21 DIAGNOSIS — Z79899 Other long term (current) drug therapy: Secondary | ICD-10-CM | POA: Diagnosis not present

## 2017-06-21 DIAGNOSIS — D125 Benign neoplasm of sigmoid colon: Secondary | ICD-10-CM | POA: Diagnosis not present

## 2017-06-21 HISTORY — PX: COLONOSCOPY: SHX5424

## 2017-06-21 HISTORY — PX: POLYPECTOMY: SHX5525

## 2017-06-21 SURGERY — COLONOSCOPY
Anesthesia: Moderate Sedation

## 2017-06-21 MED ORDER — MEPERIDINE HCL 100 MG/ML IJ SOLN
INTRAMUSCULAR | Status: AC
  Filled 2017-06-21: qty 2

## 2017-06-21 MED ORDER — ONDANSETRON HCL 4 MG/2ML IJ SOLN
INTRAMUSCULAR | Status: DC | PRN
  Administered 2017-06-21: 4 mg via INTRAVENOUS

## 2017-06-21 MED ORDER — ONDANSETRON HCL 4 MG/2ML IJ SOLN
INTRAMUSCULAR | Status: AC
  Filled 2017-06-21: qty 2

## 2017-06-21 MED ORDER — MEPERIDINE HCL 100 MG/ML IJ SOLN
INTRAMUSCULAR | Status: DC | PRN
  Administered 2017-06-21: 50 mg via INTRAVENOUS
  Administered 2017-06-21: 25 mg via INTRAVENOUS

## 2017-06-21 MED ORDER — SODIUM CHLORIDE 0.9 % IV SOLN
INTRAVENOUS | Status: DC
  Administered 2017-06-21: 09:00:00 via INTRAVENOUS

## 2017-06-21 MED ORDER — MIDAZOLAM HCL 5 MG/5ML IJ SOLN
INTRAMUSCULAR | Status: DC | PRN
  Administered 2017-06-21: 2 mg via INTRAVENOUS
  Administered 2017-06-21: 1 mg via INTRAVENOUS
  Administered 2017-06-21: 2 mg via INTRAVENOUS

## 2017-06-21 MED ORDER — STERILE WATER FOR IRRIGATION IR SOLN
Status: DC | PRN
  Administered 2017-06-21: 10:00:00

## 2017-06-21 MED ORDER — MIDAZOLAM HCL 5 MG/5ML IJ SOLN
INTRAMUSCULAR | Status: AC
  Filled 2017-06-21: qty 10

## 2017-06-21 NOTE — H&P (Signed)
@LOGO @   Primary Care Physician:  Mikey Kirschner, MD Primary Gastroenterologist:  Dr. Gala Romney  Pre-Procedure History & Physical: HPI:  Billy Mccormick is a 61 y.o. male here for here for average risk screening colonoscopy. Negative colonoscopy back in 2007. No bowel symptoms. No family history of colon cancer.  Past Medical History:  Diagnosis Date  . Degenerative cervical disc   . Essential hypertension 10/02/2015  . Hyperlipidemia   . IFG (impaired fasting glucose)   . Lumbar herniated disc 2015   L4,L5  . Psoriasis     Past Surgical History:  Procedure Laterality Date  . ACHILLES TENDON REPAIR     right  . COLONOSCOPY  06/25/2006   Dr. Gala Romney: extensive left-sided diverticula, 2 six to seven mm pale-appearing hyperplastic-appearing polypoid lesions vs atypcial appearing lipoma. (bx-hyperplastic polyps). next TCS 06/2016  . ESOPHAGOGASTRODUODENOSCOPY N/A 11/28/2013   GOT:[id number] Schatzki's ring  not manipulated. Small hiatal hernia. Gastric ulcer and erosions as above. Bx antrum-extensive intestinal metaplasia, no H.pylori  . ESOPHAGOGASTRODUODENOSCOPY N/A 05/26/2014   Procedure: ESOPHAGOGASTRODUODENOSCOPY (EGD);  Surgeon: Daneil Dolin, MD;  Location: [facility];  Service: Endoscopy;  Laterality: N/A;  7:30AM  . HERNIA REPAIR     x 2  . MALONEY DILATION N/A 11/28/2013   Procedure: Venia Minks DILATION;  Surgeon: Daneil Dolin, MD;  Location: [facility];  Service: Endoscopy;  Laterality: N/A;  . NOSE SURGERY     x 2  . SAVORY DILATION N/A 11/28/2013   Procedure: SAVORY DILATION;  Surgeon: Daneil Dolin, MD;  Location: [facility];  Service: Endoscopy;  Laterality: N/A;    Prior to Admission medications   Medication Sig Start Date End Date Taking? Authorizing Provider  enalapril (VASOTEC) 5 MG tablet take 1 tablet once daily 05/16/17  Yes Mikey Kirschner, MD  pantoprazole (PROTONIX) 40 MG tablet take 1 tablet once daily 06/21/16  Yes Mikey Kirschner, MD     Allergies as of 05/17/2017 - Review Complete 05/14/2017  Allergen Reaction Noted  . Prednisone  01/14/2013    Family History  Problem Relation Age of Onset  . Hyperlipidemia Mother   . Hyperlipidemia Father   . Kidney disease Brother   . Colon cancer Neg Hx     Social History   Socioeconomic History  . Marital status: Married    Spouse name: Not on file  . Number of children: 0  . Years of education: Not on file  . Highest education level: Not on file  Social Needs  . Financial resource strain: Not on file  . Food insecurity - worry: Not on file  . Food insecurity - inability: Not on file  . Transportation needs - medical: Not on file  . Transportation needs - non-medical: Not on file  Occupational History  . Not on file  Tobacco Use  . Smoking status: Current Every Day Smoker    Packs/day: 1.00    Years: 35.00    Pack years: 35.00    Types: Cigarettes  . Smokeless tobacco: Never Used  . Tobacco comment: currently smokes 3/4 pack daily   Substance and Sexual Activity  . Alcohol use: Yes    Alcohol/week: 6.0 oz    Types: 7 Cans of beer, 3 Shots of liquor per week    Comment: 6 beers about 3 days weekly  . Drug use: No  . Sexual activity: Not on file  Other Topics Concern  . Not on file  Social History Narrative  .  Not on file    Review of Systems: See HPI, otherwise negative ROS  Physical Exam: BP (!) 138/94   Pulse 84   Temp 97.8 F (36.6 C) (Oral)   Resp 18   Ht 6\' 1"  (1.854 m)   Wt 175 lb (79.4 kg)   SpO2 98%   BMI 23.09 kg/m  General:   Alert,  Well-developed, well-nourished, pleasant and cooperative in NAD Neck:  Supple; no masses or thyromegaly. No significant cervical adenopathy. Lungs:  Clear throughout to auscultation.   No wheezes, crackles, or rhonchi. No acute distress. Heart:  Regular rate and rhythm; no murmurs, clicks, rubs,  or gallops. Abdomen: Non-distended, normal bowel sounds.  Soft and nontender without appreciable mass or  hepatosplenomegaly.  Pulses:  Normal pulses noted. Extremities:  Without clubbing or edema.  Impression:  Pleasant 61 year old gentleman here for average risk screening colonoscopy.  Recommendations:  I have offered the patient a screening colonoscopy today.  The risks, benefits, limitations, alternatives and imponderables have been reviewed with the patient. Questions have been answered. All parties are agreeable.    Notice: This dictation was prepared with Dragon dictation along with smaller phrase technology. Any transcriptional errors that result from this process are unintentional and may not be corrected upon review.

## 2017-06-21 NOTE — Discharge Instructions (Addendum)
Diverticulosis °Diverticulosis is a condition that develops when small pouches (diverticula) form in the wall of the large intestine (colon). The colon is where water is absorbed and stool is formed. The pouches form when the inside layer of the colon pushes through weak spots in the outer layers of the colon. You may have a few pouches or many of them. °What are the causes? °The cause of this condition is not known. °What increases the risk? °The following factors may make you more likely to develop this condition: °· Being older than age 60. Your risk for this condition increases with age. Diverticulosis is rare among people younger than age 30. By age 80, many people have it. °· Eating a low-fiber diet. °· Having frequent constipation. °· Being overweight. °· Not getting enough exercise. °· Smoking. °· Taking over-the-counter pain medicines, like aspirin and ibuprofen. °· Having a family history of diverticulosis. ° °What are the signs or symptoms? °In most people, there are no symptoms of this condition. If you do have symptoms, they may include: °· Bloating. °· Cramps in the abdomen. °· Constipation or diarrhea. °· Pain in the lower left side of the abdomen. ° °How is this diagnosed? °This condition is most often diagnosed during an exam for other colon problems. Because diverticulosis usually has no symptoms, it often cannot be diagnosed independently. This condition may be diagnosed by: °· Using a flexible scope to examine the colon (colonoscopy). °· Taking an X-ray of the colon after dye has been put into the colon (barium enema). °· Doing a CT scan. ° °How is this treated? °You may not need treatment for this condition if you have never developed an infection related to diverticulosis. If you have had an infection before, treatment may include: °· Eating a high-fiber diet. This may include eating more fruits, vegetables, and grains. °· Taking a fiber supplement. °· Taking a live bacteria supplement  (probiotic). °· Taking medicine to relax your colon. °· Taking antibiotic medicines. ° °Follow these instructions at home: °· Drink 6-8 glasses of water or more each day to prevent constipation. °· Try not to strain when you have a bowel movement. °· If you have had an infection before: °? Eat more fiber as directed by your health care provider or your diet and nutrition specialist (dietitian). °? Take a fiber supplement or probiotic, if your health care provider approves. °· Take over-the-counter and prescription medicines only as told by your health care provider. °· If you were prescribed an antibiotic, take it as told by your health care provider. Do not stop taking the antibiotic even if you start to feel better. °· Keep all follow-up visits as told by your health care provider. This is important. °Contact a health care provider if: °· You have pain in your abdomen. °· You have bloating. °· You have cramps. °· You have not had a bowel movement in 3 days. °Get help right away if: °· Your pain gets worse. °· Your bloating becomes very bad. °· You have a fever or chills, and your symptoms suddenly get worse. °· You vomit. °· You have bowel movements that are bloody or black. °· You have bleeding from your rectum. °Summary °· Diverticulosis is a condition that develops when small pouches (diverticula) form in the wall of the large intestine (colon). °· You may have a few pouches or many of them. °· This condition is most often diagnosed during an exam for other colon problems. °· If you have had an   infection related to diverticulosis, treatment may include increasing the fiber in your diet, taking supplements, or taking medicines. °This information is not intended to replace advice given to you by your health care provider. Make sure you discuss any questions you have with your health care provider. °Document Released: 03/30/2004 Document Revised: 05/22/2016 Document Reviewed: 05/22/2016 °Elsevier Interactive  Patient Education © 2017 [employer] °Colon Polyps °Polyps are tissue growths inside the body. Polyps can grow in many places, including the large intestine (colon). A polyp may be a round bump or a mushroom-shaped growth. You could have one polyp or several. °Most colon polyps are noncancerous (benign). However, some colon polyps can become cancerous over time. °What are the causes? °The exact cause of colon polyps is not known. °What increases the risk? °This condition is more likely to develop in people who: °· Have a family history of colon cancer or colon polyps. °· Are older than 50 or older than 45 if they are African American. °· Have inflammatory bowel disease, such as ulcerative colitis or Crohn disease. °· Are overweight. °· Smoke cigarettes. °· Do not get enough exercise. °· Drink too much alcohol. °· Eat a diet that is: °? High in fat and red meat. °? Low in fiber. °· Had childhood cancer that was treated with abdominal radiation. ° °What are the signs or symptoms? °Most polyps do not cause symptoms. If you have symptoms, they may include: °· Blood coming from your rectum when having a bowel movement. °· Blood in your stool. The stool may look dark red or black. °· A change in bowel habits, such as constipation or diarrhea. ° °How is this diagnosed? °This condition is diagnosed with a colonoscopy. This is a procedure that uses a lighted, flexible scope to look at the inside of your colon. °How is this treated? °Treatment for this condition involves removing any polyps that are found. Those polyps will then be tested for cancer. If cancer is found, your health care provider will talk to you about options for colon cancer treatment. °Follow these instructions at home: °Diet °· Eat plenty of fiber, such as fruits, vegetables, and whole grains. °· Eat foods that are high in calcium and vitamin D, such as milk, cheese, yogurt, eggs, liver, fish, and broccoli. °· Limit foods high in fat, red meats, and  processed meats, such as hot dogs, sausage, bacon, and lunch meats. °· Maintain a healthy weight, or lose weight if recommended by your health care provider. °General instructions °· Do not smoke cigarettes. °· Do not drink alcohol excessively. °· Keep all follow-up visits as told by your health care provider. This is important. This includes keeping regularly scheduled colonoscopies. Talk to your health care provider about when you need a colonoscopy. °· Exercise every day or as told by your health care provider. °Contact a health care provider if: °· You have new or worsening bleeding during a bowel movement. °· You have new or increased blood in your stool. °· You have a change in bowel habits. °· You unexpectedly lose weight. °This information is not intended to replace advice given to you by your health care provider. Make sure you discuss any questions you have with your health care provider. °Document Released: 03/29/2004 Document Revised: 12/09/2015 Document Reviewed: 05/24/2015 °Elsevier Interactive Patient Education © 2018 [employer] ° °Colonoscopy °Discharge Instructions ° °Read the instructions outlined below and refer to this sheet in the next few weeks. These discharge instructions provide you with general information on caring   for yourself after you leave the hospital. Your doctor may also give you specific instructions. While your treatment has been planned according to the most current medical practices available, unavoidable complications occasionally occur. If you have any problems or questions after discharge, call Dr. Rourk at [phone]. °ACTIVITY °· You may resume your regular activity, but move at a slower pace for the next 24 hours.  °· Take frequent rest periods for the next 24 hours.  °· Walking will help get rid of the air and reduce the bloated feeling in your belly (abdomen).  °· No driving for 24 hours (because of the medicine (anesthesia) used during the test).   °· Do not sign any  important legal documents or operate any machinery for 24 hours (because of the anesthesia used during the test).  °NUTRITION °· Drink plenty of fluids.  °· You may resume your normal diet as instructed by your doctor.  °· Begin with a light meal and progress to your normal diet. Heavy or fried foods are harder to digest and may make you feel sick to your stomach (nauseated).  °· Avoid alcoholic beverages for 24 hours or as instructed.  °MEDICATIONS °· You may resume your normal medications unless your doctor tells you otherwise.  °WHAT YOU CAN EXPECT TODAY °· Some feelings of bloating in the abdomen.  °· Passage of more gas than usual.  °· Spotting of blood in your stool or on the toilet paper.  °IF YOU HAD POLYPS REMOVED DURING THE COLONOSCOPY: °· No aspirin products for 7 days or as instructed.  °· No alcohol for 7 days or as instructed.  °· Eat a soft diet for the next 24 hours.  °FINDING OUT THE RESULTS OF YOUR TEST °Not all test results are available during your visit. If your test results are not back during the visit, make an appointment with your caregiver to find out the results. Do not assume everything is normal if you have not heard from your caregiver or the medical facility. It is important for you to follow up on all of your test results.  °SEEK IMMEDIATE MEDICAL ATTENTION IF: °· You have more than a spotting of blood in your stool.  °· Your belly is swollen (abdominal distention).  °· You are nauseated or vomiting.  °· You have a temperature over 101.  °· You have abdominal pain or discomfort that is severe or gets worse throughout the day.  ° °Colon polyp and diverticulosis information provided ° °Further recommendations to follow pending review of pathology report ° °

## 2017-06-21 NOTE — Op Note (Signed)
[name] Procedure Date: 06/21/2017 9:11 AM MRN: [id number] Date of Birth: [birth year] Attending MD: Norvel Richards , MD CSN: [id number] Age: 61 Admit Type: Outpatient Procedure:                Colonoscopy Indications:              Screening for colorectal malignant neoplasm Providers:                Norvel Richards, MD, Jeanann Lewandowsky. Sharon Seller, RN,                            Nelma Rothman, Technician, Aram Candela Referring MD:              Medicines:                Midazolam 5 mg IV, Meperidine 75 mg IV, Ondansetron                            4 mg IV Complications:            No immediate complications. Estimated Blood Loss:     Estimated blood loss was minimal. Procedure:                Pre-Anesthesia Assessment:                           - Prior to the procedure, a History and Physical                            was performed, and patient medications and                            allergies were reviewed. The patient's tolerance of                            previous anesthesia was also reviewed. The risks                            and benefits of the procedure and the sedation                            options and risks were discussed with the patient.                            All questions were answered, and informed consent                            was obtained. Prior Anticoagulants: The patient has                            taken no previous anticoagulant or antiplatelet                            agents. ASA Grade Assessment: II - A patient with  mild systemic disease. After reviewing the risks                            and benefits, the patient was deemed in                            satisfactory condition to undergo the procedure.                           After obtaining informed consent, the colonoscope                            was passed under direct vision. Throughout the   procedure, the patient's blood pressure, pulse, and                            oxygen saturations were monitored continuously. The                            EC-[id number] ([id number]) scope was introduced through                            the anus and advanced to the the cecum, identified                            by appendiceal orifice and ileocecal valve. The                            ileocecal valve, appendiceal orifice, and rectum                            were photographed. The quality of the bowel                            preparation was adequate. Scope In: 9:42:21 AM Scope Out: 3:35:45 AM Scope Withdrawal Time: 0 hours 14 minutes 8 seconds  Total Procedure Duration: 0 hours 15 minutes 50 seconds  Findings:      Multiple small and large-mouthed diverticula were found in the sigmoid       colon and descending colon. There was no evidence of diverticular       bleeding.      Two sessile polyps were found in the rectum and sigmoid colon. The       polyps were 4 to 6 mm in size. These polyps were removed with a cold       snare. Resection and retrieval were complete. Estimated blood loss was       minimal.      The exam was otherwise without abnormality on direct and retroflexion       views. Impression:               - Diverticulosis in the sigmoid colon and in the                            descending colon. There was no evidence of  diverticular bleeding.                           - Two 4 to 6 mm polyps in the rectum and in the                            sigmoid colon, removed with a cold snare. Resected                            and retrieved.                           - The examination was otherwise normal on direct                            and retroflexion views. Moderate Sedation:      Moderate (conscious) sedation was administered by the endoscopy nurse       and supervised by the endoscopist. The following parameters were       monitored:  oxygen saturation, heart rate, blood pressure, respiratory       rate, EKG, adequacy of pulmonary ventilation, and response to care.       Total physician intraservice time was 24 minutes. Recommendation:           - Patient has a contact number available for                            emergencies. The signs and symptoms of potential                            delayed complications were discussed with the                            patient. Return to normal activities tomorrow.                            Written discharge instructions were provided to the                            patient.                           - Resume previous diet.                           - Continue present medications.                           - Await pathology results.                           - Repeat colonoscopy date to be determined after                            pending pathology results are reviewed for  surveillance based on pathology results. Procedure Code(s):        --- Professional ---                           [phone], Colonoscopy, flexible; with removal of                            tumor(s), polyp(s), or other lesion(s) by snare                            technique                           [id number], Moderate sedation services provided by the                            same physician or other qualified health care                            professional performing the diagnostic or                            therapeutic service that the sedation supports,                            requiring the presence of an independent trained                            observer to assist in the monitoring of the                            patient's level of consciousness and physiological                            status; initial 15 minutes of intraservice time,                            patient age 42 years or older                           [phone], Moderate sedation services; each  additional                            15 minutes intraservice time Diagnosis Code(s):        --- Professional ---                           Z12.11, Encounter for screening for malignant                            neoplasm of colon                           K62.1, Rectal polyp  D12.5, Benign neoplasm of sigmoid colon                           K57.30, Diverticulosis of large intestine without                            perforation or abscess without bleeding CPT copyright 2016 American Medical Association. All rights reserved. The codes documented in this report are preliminary and upon coder review may  be revised to meet current compliance requirements. Cristopher Estimable. Maaz Spiering, MD Norvel Richards, MD 06/21/2017 10:06:35 AM This report has been signed electronically. Number of Addenda: 0

## 2017-06-23 ENCOUNTER — Encounter: Payer: Self-pay | Admitting: Internal Medicine

## 2017-06-26 ENCOUNTER — Encounter (HOSPITAL_COMMUNITY): Payer: Self-pay | Admitting: Internal Medicine

## 2017-07-18 ENCOUNTER — Other Ambulatory Visit: Payer: Self-pay | Admitting: Family Medicine

## 2017-08-01 ENCOUNTER — Ambulatory Visit: Payer: 59 | Admitting: Family Medicine

## 2017-08-06 ENCOUNTER — Ambulatory Visit: Payer: 59 | Admitting: Family Medicine

## 2017-08-14 ENCOUNTER — Encounter: Payer: Self-pay | Admitting: Family Medicine

## 2017-08-14 ENCOUNTER — Ambulatory Visit: Payer: 59 | Admitting: Family Medicine

## 2017-08-14 VITALS — BP 138/88 | Ht 73.0 in | Wt 183.0 lb

## 2017-08-14 DIAGNOSIS — I10 Essential (primary) hypertension: Secondary | ICD-10-CM

## 2017-08-14 DIAGNOSIS — I714 Abdominal aortic aneurysm, without rupture, unspecified: Secondary | ICD-10-CM

## 2017-08-14 DIAGNOSIS — E785 Hyperlipidemia, unspecified: Secondary | ICD-10-CM

## 2017-08-14 NOTE — Progress Notes (Signed)
   Subjective:    Patient ID: Billy Mccormick, male    DOB: [birth year], 62 y.o.   MRN: [id number]  Hypertension  This is a chronic problem. There are no compliance problems (eats healthy, exercises, takes meds everyday).    Pt states no concerns today.   Blood pressure medicine and blood pressure levels reviewed today with patient. Compliant with blood pressure medicine. States does not miss a dose. No obvious side effects. Blood pressure generally good when checked elsewhere. Watching salt intake.  Patient has history of hyperlipidemia.  See prior notes.  He has been working on cutting fats down in his diet for the last 8 weeks have not been good.  In addition the last 8 weeks he has not exercise.  Last summer he declined going on medication.  Positive substantial history of smoking, 3/4 pack a day.  Now on yearly CT scan analysis  Notes some fatigue with exertion.  No true chest pain no excessive dyspnea       Not cking bp  Not exrcising as well six to eigt weekx    Review of Systems No headache, no major weight loss or weight gain, no chest pain no back pain abdominal pain no change in bowel habits complete ROS otherwise negative     Objective:   Physical Exam Alert and oriented, vitals reviewed and stable, NAD ENT-TM's and ext canals WNL bilat via otoscopic exam Soft palate, tonsils and post pharynx WNL via oropharyngeal exam Neck-symmetric, no masses; thyroid nonpalpable and nontender Pulmonary-no tachypnea or accessory muscle use; Clear without wheezes via auscultation Card--no abnrml murmurs, rhythm reg and rate WNL Carotid pulses symmetric, without bruits        Assessment & Plan:  Impression 1 hypertension discussed good control maintain same meds compliance with medication discussed diet exercise discussed  2.  Chronic smoking smoking cessation methods discussed alternatives discussed  3.  Hyperlipidemia.  Patient wishes to work 6 more months on this  before considering medication.  Will agree with this at that time if still similar aberrations with numbers will recommend medication patient understands

## 2017-08-17 ENCOUNTER — Other Ambulatory Visit: Payer: Self-pay | Admitting: Family Medicine

## 2017-10-11 ENCOUNTER — Other Ambulatory Visit: Payer: Self-pay | Admitting: Family Medicine

## 2017-11-08 ENCOUNTER — Ambulatory Visit (INDEPENDENT_AMBULATORY_CARE_PROVIDER_SITE_OTHER)
Admission: RE | Admit: 2017-11-08 | Discharge: 2017-11-08 | Disposition: A | Discharge status: Home / Self Care | Payer: 59 | Source: Ambulatory Visit | Attending: Acute Care | Admitting: Acute Care

## 2017-11-08 DIAGNOSIS — F17210 Nicotine dependence, cigarettes, uncomplicated: Secondary | ICD-10-CM

## 2017-11-12 ENCOUNTER — Other Ambulatory Visit: Payer: Self-pay | Admitting: Family Medicine

## 2017-11-13 ENCOUNTER — Other Ambulatory Visit: Payer: Self-pay | Admitting: Acute Care

## 2017-11-13 DIAGNOSIS — Z122 Encounter for screening for malignant neoplasm of respiratory organs: Secondary | ICD-10-CM

## 2017-11-13 DIAGNOSIS — F17210 Nicotine dependence, cigarettes, uncomplicated: Secondary | ICD-10-CM

## 2017-11-15 ENCOUNTER — Encounter: Payer: Self-pay | Admitting: Family

## 2017-11-15 ENCOUNTER — Ambulatory Visit (INDEPENDENT_AMBULATORY_CARE_PROVIDER_SITE_OTHER): Payer: 59 | Admitting: Family

## 2017-11-15 ENCOUNTER — Ambulatory Visit (HOSPITAL_COMMUNITY)
Admission: RE | Admit: 2017-11-15 | Discharge: 2017-11-15 | Disposition: A | Discharge status: Home / Self Care | Payer: 59 | Source: Ambulatory Visit | Attending: Family | Admitting: Family

## 2017-11-15 VITALS — BP 110/65 | HR 64 | Temp 97.6°F | Resp 18 | Ht 73.5 in | Wt 180.0 lb

## 2017-11-15 DIAGNOSIS — I714 Abdominal aortic aneurysm, without rupture, unspecified: Secondary | ICD-10-CM

## 2017-11-15 DIAGNOSIS — F17200 Nicotine dependence, unspecified, uncomplicated: Secondary | ICD-10-CM | POA: Diagnosis not present

## 2017-11-15 NOTE — Progress Notes (Signed)
[facility] [place]   CC: Follow up Abdominal Aortic Aneurysm  History of Present Illness  Billy Mccormick is a 62 y.o. (1956/02/09) male whom Dr. Donnetta Hutching has been monitoring with an asymptomatic abdominal aortic aneurysm. This was discovered incidentally on lumbar spine  MRI in 2016. Maximal diameter that time was 3.9 cm. He had an ultrasound in our office on 08/11/2014 predicting 3.3 cm maximal diameter. He has no symptoms referable to this.   The patient denies claudication in his legs with walking. He denies any history of stroke or TIA symptoms.  Diabetic: No Tobacco use: smoker  (1 ppd, started at age 14 yrs)   Past Medical History:  Diagnosis Date  . Degenerative cervical disc   . Essential hypertension 10/02/2015  . Hyperlipidemia   . IFG (impaired fasting glucose)   . Lumbar herniated disc 2015   L4,L5  . Psoriasis    Past Surgical History:  Procedure Laterality Date  . ACHILLES TENDON REPAIR     right  . COLONOSCOPY  06/25/2006   Dr. Gala Romney: extensive left-sided diverticula, 2 six to seven mm pale-appearing hyperplastic-appearing polypoid lesions vs atypcial appearing lipoma. (bx-hyperplastic polyps). next TCS 06/2016  . COLONOSCOPY N/A 06/21/2017   Procedure: COLONOSCOPY;  Surgeon: Daneil Dolin, MD;  Location: [facility];  Service: Endoscopy;  Laterality: N/A;  9:45 AM  . ESOPHAGOGASTRODUODENOSCOPY N/A 11/28/2013   AYT:[id number] Schatzki's ring  not manipulated. Small hiatal hernia. Gastric ulcer and erosions as above. Bx antrum-extensive intestinal metaplasia, no H.pylori  . ESOPHAGOGASTRODUODENOSCOPY N/A 05/26/2014   Procedure: ESOPHAGOGASTRODUODENOSCOPY (EGD);  Surgeon: Daneil Dolin, MD;  Location: [facility];  Service: Endoscopy;  Laterality: N/A;  7:30AM  . HERNIA REPAIR     x 2  . MALONEY DILATION N/A 11/28/2013   Procedure: Venia Minks DILATION;  Surgeon: Daneil Dolin, MD;  Location: [facility];  Service: Endoscopy;   Laterality: N/A;  . NOSE SURGERY     x 2  . POLYPECTOMY  06/21/2017   Procedure: POLYPECTOMY;  Surgeon: Daneil Dolin, MD;  Location: [facility];  Service: Endoscopy;;  colon  . SAVORY DILATION N/A 11/28/2013   Procedure: SAVORY DILATION;  Surgeon: Daneil Dolin, MD;  Location: [facility];  Service: Endoscopy;  Laterality: N/A;   Social History Social History   Socioeconomic History  . Marital status: Married    Spouse name: Not on file  . Number of children: 0  . Years of education: Not on file  . Highest education level: Not on file  Occupational History  . Not on file  Social Needs  . Financial resource strain: Not on file  . Food insecurity:    Worry: Not on file    Inability: Not on file  . Transportation needs:    Medical: Not on file    Non-medical: Not on file  Tobacco Use  . Smoking status: Current Every Day Smoker    Packs/day: 1.00    Years: 35.00    Pack years: 35.00    Types: Cigarettes  . Smokeless tobacco: Never Used  . Tobacco comment: currently smokes 3/4 pack daily   Substance and Sexual Activity  . Alcohol use: Yes    Alcohol/week: 6.0 oz    Types: 7 Cans of beer, 3 Shots of liquor per week    Comment: 6 beers about 3 days weekly  . Drug use: No  . Sexual activity: Not on file  Lifestyle  . Physical  activity:    Days per week: Not on file    Minutes per session: Not on file  . Stress: Not on file  Relationships  . Social connections:    Talks on phone: Not on file    Gets together: Not on file    Attends religious service: Not on file    Active member of club or organization: Not on file    Attends meetings of clubs or organizations: Not on file    Relationship status: Not on file  . Intimate partner violence:    Fear of current or ex partner: Not on file    Emotionally abused: Not on file    Physically abused: Not on file    Forced sexual activity: Not on file  Other Topics Concern  . Not on file  Social History Narrative  .  Not on file   Family History Family History  Problem Relation Age of Onset  . Hyperlipidemia Mother   . Hyperlipidemia Father   . Kidney disease Brother   . Colon cancer Neg Hx     Current Outpatient Medications on File Prior to Visit  Medication Sig Dispense Refill  . enalapril (VASOTEC) 5 MG tablet TAKE 1 TABLET BY MOUTH ONCE DAILY 30 tablet 2  . pantoprazole (PROTONIX) 40 MG tablet take 1 tablet by mouth once daily 30 tablet 5   No current facility-administered medications on file prior to visit.    Allergies  Allergen Reactions  . Prednisone     Side effects    ROS: See HPI for pertinent positives and negatives.  Physical Examination  Vitals:   11/15/17 0837  BP: 110/65  Pulse: 64  Resp: 18  Temp: 97.6 F (36.4 C)  TempSrc: Oral  SpO2: 96%  Weight: 180 lb (81.6 kg)  Height: 6' 1.5" (1.867 m)   Body mass index is 23.43 kg/m.  General: A&O x 3, WD, male. HEENT: WNL, no gross abnormalities  Pulmonary: Sym exp, respirations are non labored, good air movt, CTAB, no rales, rhonchi, or wheezing. Cardiac: RRR, Nl S1, S2, no detected murmur.  Carotid Bruits Right Left   Negative Negative    Abdominal aortic pulse is not palpable Radial pulses are 2+ palpable and =                          VASCULAR EXAM:                                                                                                                                           LE Pulses Right Left       FEMORAL  2+ palpable  2+ palpable        POPLITEAL  1+ palpable   1+ palpable       POSTERIOR TIBIAL  2+ palpable   2+ palpable  DORSALIS PEDIS      ANTERIOR TIBIAL not palpable  not palpable     Gastrointestinal: soft, NTND, -G/R, - HSM, - masses palpated, - CVAT B. Musculoskeletal: M/S 5/5 throughout, Extremities without ischemic changes. Neurologic: CN 2-12 intact, Pain and light touch intact in extremities are intact, Motor exam as listed above. Skin: No rashes, no  ulcers, no cellulitis.   Psychiatric: Normal thought content, mood appropriate to clinical situation.    DATA  AAA Duplex (11/15/2017):  Previous size: 4.3 cm (Date: 10/26/16) Right CIA: 1.0 cm; Left CIA: 1.0 cm   Current size:  4.5 cm (Date: 11/15/17); Right CIA: 1.0 cm; Left CIA: 1.1 cm  Medical Decision Making  The patient is a 62 y.o. male who presents with asymptomatic AAA with 2 mm increase in size in a year, to 4.5 cm today. His blood pressure remains in good control.   The patient was counseled re smoking cessation and given several free resources re smoking cessation.    Based on this patient's exam and diagnostic studies, the patient will follow up in 6 months  with the following studies: AAA duplex.  Consideration for repair of AAA would be made when the size is 5.0 cm, growth > 1 cm/yr, and symptomatic status.        The patient was given information about AAA including signs, symptoms, treatment, and how to minimize the risk of enlargement and rupture of aneurysms.    I emphasized the importance of maximal medical management including strict control of blood pressure, blood glucose, and lipid levels, antiplatelet agents, obtaining regular exercise, and cessation of smoking.   The patient was advised to call 911 should the patient experience sudden onset abdominal or back pain.   Thank you for allowing [place] to participate in this patient's care.  Clemon Chambers, RN, MSN, FNP-C [facility] [place] Office: [facility] Physician: Oneida Alar  11/15/2017, 8:52 AM

## 2017-11-15 NOTE — Patient Instructions (Addendum)
Before your next abdominal ultrasound:  Take two Extra-Strength Gas-X capsules at bedtime the night before the test. Take another two Extra-Strength Gas-X capsules 3 hours before the test.  Avoid gas forming foods and beverages the day before the test.      Steps to Quit Smoking Smoking tobacco can be bad for your health. It can also affect almost every organ in your body. Smoking puts you and people around you at risk for many serious long-lasting (chronic) diseases. Quitting smoking is hard, but it is one of the best things that you can do for your health. It is never too late to quit. What are the benefits of quitting smoking? When you quit smoking, you lower your risk for getting serious diseases and conditions. They can include:  Lung cancer or lung disease.  Heart disease.  Stroke.  Heart attack.  Not being able to have children (infertility).  Weak bones (osteoporosis) and broken bones (fractures).  If you have coughing, wheezing, and shortness of breath, those symptoms may get better when you quit. You may also get sick less often. If you are pregnant, quitting smoking can help to lower your chances of having a baby of low birth weight. What can I do to help me quit smoking? Talk with your doctor about what can help you quit smoking. Some things you can do (strategies) include:  Quitting smoking totally, instead of slowly cutting back how much you smoke over a period of time.  Going to in-person counseling. You are more likely to quit if you go to many counseling sessions.  Using resources and support systems, such as: ? Online chats with a counselor. ? Phone quitlines. ? Printed self-help materials. ? Support groups or group counseling. ? Text messaging programs. ? Mobile phone apps or applications.  Taking medicines. Some of these medicines may have nicotine in them. If you are pregnant or breastfeeding, do not take any medicines to quit smoking unless your doctor  says it is okay. Talk with your doctor about counseling or other things that can help you.  Talk with your doctor about using more than one strategy at the same time, such as taking medicines while you are also going to in-person counseling. This can help make quitting easier. What things can I do to make it easier to quit? Quitting smoking might feel very hard at first, but there is a lot that you can do to make it easier. Take these steps:  Talk to your family and friends. Ask them to support and encourage you.  Call phone quitlines, reach out to support groups, or work with a counselor.  Ask people who smoke to not smoke around you.  Avoid places that make you want (trigger) to smoke, such as: ? Bars. ? Parties. ? Smoke-break areas at work.  Spend time with people who do not smoke.  Lower the stress in your life. Stress can make you want to smoke. Try these things to help your stress: ? Getting regular exercise. ? Deep-breathing exercises. ? Yoga. ? Meditating. ? Doing a body scan. To do this, close your eyes, focus on one area of your body at a time from head to toe, and notice which parts of your body are tense. Try to relax the muscles in those areas.  Download or buy apps on your mobile phone or tablet that can help you stick to your quit plan. There are many free apps, such as QuitGuide from the CDC (Centers for Disease Control   and Prevention). You can find more support from smokefree.gov and other websites.  This information is not intended to replace advice given to you by your health care provider. Make sure you discuss any questions you have with your health care provider. Document Released: 04/29/2009 Document Revised: 02/29/2016 Document Reviewed: 11/17/2014 Elsevier Interactive Patient Education  2018 Elsevier Inc.    Abdominal Aortic Aneurysm Blood pumps away from the heart through tubes (blood vessels) called arteries. Aneurysms are weak or damaged places in the  wall of an artery. It bulges out like a balloon. An abdominal aortic aneurysm happens in the main artery of the body (aorta). It can burst or tear, causing bleeding inside the body. This is an emergency. It needs treatment right away. What are the causes? The exact cause is unknown. Things that could cause this problem include:  Fat and other substances building up in the lining of a tube.  Swelling of the walls of a blood vessel.  Certain tissue diseases.  Belly (abdominal) trauma.  An infection in the main artery of the body.  What increases the risk? There are things that make it more likely for you to have an aneurysm. These include:  Being over the age of 62 years old.  Having high blood pressure (hypertension).  Being a male.  Being white.  Being very overweight (obese).  Having a family history of aneurysm.  Using tobacco products.  What are the signs or symptoms? Symptoms depend on the size of the aneurysm and how fast it grows. There may not be symptoms. If symptoms occur, they can include:  Pain (belly, side, lower back, or groin).  Feeling full after eating a small amount of food.  Feeling sick to your stomach (nauseous), throwing up (vomiting), or both.  Feeling a lump in your belly that feels like it is beating (pulsating).  Feeling like you will pass out (faint).  How is this treated?  Medicine to control blood pressure and pain.  Imaging tests to see if the aneurysm gets bigger.  Surgery. How is this prevented? To lessen your chance of getting this condition:  Stop smoking. Stop chewing tobacco.  Limit or avoid alcohol.  Keep your blood pressure, blood sugar, and cholesterol within normal limits.  Eat less salt.  Eat foods low in saturated fats and cholesterol. These are found in animal and whole dairy products.  Eat more fiber. Fiber is found in whole grains, vegetables, and fruits.  Keep a healthy weight.  Stay active and exercise  often.  This information is not intended to replace advice given to you by your health care provider. Make sure you discuss any questions you have with your health care provider. Document Released: 10/28/2012 Document Revised: 12/09/2015 Document Reviewed: 08/02/2012 Elsevier Interactive Patient Education  2017 Elsevier Inc.  

## 2017-12-27 ENCOUNTER — Other Ambulatory Visit: Payer: Self-pay

## 2017-12-27 DIAGNOSIS — I714 Abdominal aortic aneurysm, without rupture, unspecified: Secondary | ICD-10-CM

## 2018-01-31 ENCOUNTER — Other Ambulatory Visit: Payer: Self-pay | Admitting: Family Medicine

## 2018-01-31 ENCOUNTER — Telehealth: Payer: Self-pay | Admitting: Family Medicine

## 2018-01-31 DIAGNOSIS — Z125 Encounter for screening for malignant neoplasm of prostate: Secondary | ICD-10-CM

## 2018-01-31 DIAGNOSIS — E785 Hyperlipidemia, unspecified: Secondary | ICD-10-CM

## 2018-01-31 DIAGNOSIS — I10 Essential (primary) hypertension: Secondary | ICD-10-CM

## 2018-01-31 DIAGNOSIS — Z79899 Other long term (current) drug therapy: Secondary | ICD-10-CM

## 2018-01-31 DIAGNOSIS — Z0000 Encounter for general adult medical examination without abnormal findings: Secondary | ICD-10-CM

## 2018-01-31 MED ORDER — PANTOPRAZOLE SODIUM 40 MG PO TBEC: 40.0000 mg | DELAYED_RELEASE_TABLET | Freq: Every day | ORAL | 1 refills | Status: DC

## 2018-01-31 MED ORDER — ENALAPRIL MALEATE 5 MG PO TABS: 5.0000 mg | ORAL_TABLET | Freq: Every day | ORAL | 1 refills | Status: DC

## 2018-01-31 NOTE — Telephone Encounter (Signed)
Last labs on 12/23/16 Hepatic and lipid; on 09/30/16 BMP, lipid, hepatic, PSA

## 2018-01-31 NOTE — Telephone Encounter (Signed)
Labs placed; med sent in and pt notified

## 2018-01-31 NOTE — Telephone Encounter (Signed)
Needs refills on pantoprazole (PROTONIX) 40 MG tablet and enalapril (VASOTEC) 5 MG tablet Would like a 90 day supply if possible.   Has appt for physical on 02-22-18 and needs order for labs put in also.   [facility] on [address] on home phone with all info.

## 2018-01-31 NOTE — Telephone Encounter (Signed)
Lip liv met 7 psa six mo meds 90 d ok

## 2018-02-11 ENCOUNTER — Encounter: Payer: 59 | Admitting: Family Medicine

## 2018-02-14 DIAGNOSIS — D225 Melanocytic nevi of trunk: Secondary | ICD-10-CM | POA: Diagnosis not present

## 2018-02-14 DIAGNOSIS — Z1283 Encounter for screening for malignant neoplasm of skin: Secondary | ICD-10-CM | POA: Diagnosis not present

## 2018-02-16 DIAGNOSIS — Z0000 Encounter for general adult medical examination without abnormal findings: Secondary | ICD-10-CM | POA: Diagnosis not present

## 2018-02-16 DIAGNOSIS — Z125 Encounter for screening for malignant neoplasm of prostate: Secondary | ICD-10-CM | POA: Diagnosis not present

## 2018-02-16 DIAGNOSIS — I10 Essential (primary) hypertension: Secondary | ICD-10-CM | POA: Diagnosis not present

## 2018-02-17 LAB — LIPID PANEL
CHOLESTEROL TOTAL: 183 mg/dL (ref 100–199)
Chol/HDL Ratio: 4.7 ratio (ref 0.0–5.0)
HDL: 39 mg/dL — ABNORMAL LOW (ref >39)
LDL CALC: 128 mg/dL — AB (ref 0–99)
TRIGLYCERIDES: 82 mg/dL (ref 0–149)
VLDL Cholesterol Cal: 16 mg/dL (ref 5–40)

## 2018-02-17 LAB — BASIC METABOLIC PANEL
BUN/Creatinine Ratio: 14 (ref 10–24)
BUN: 13 mg/dL (ref 8–27)
CHLORIDE: 105 mmol/L (ref 96–106)
CO2: 22 mmol/L (ref 20–29)
CREATININE: 0.9 mg/dL (ref 0.76–1.27)
Calcium: 9.3 mg/dL (ref 8.6–10.2)
GFR calc Af Amer: 106 mL/min/{1.73_m2} (ref >59)
GFR calc non Af Amer: 92 mL/min/{1.73_m2} (ref >59)
GLUCOSE: 96 mg/dL (ref 65–99)
POTASSIUM: 4.9 mmol/L (ref 3.5–5.2)
Sodium: 141 mmol/L (ref 134–144)

## 2018-02-17 LAB — HEPATIC FUNCTION PANEL
ALK PHOS: 55 IU/L (ref 39–117)
ALT: 15 IU/L (ref 0–44)
AST: 20 IU/L (ref 0–40)
Albumin: 4.1 g/dL (ref 3.6–4.8)
BILIRUBIN TOTAL: 0.2 mg/dL (ref 0.0–1.2)
BILIRUBIN, DIRECT: 0.08 mg/dL (ref 0.00–0.40)
Total Protein: 6.6 g/dL (ref 6.0–8.5)

## 2018-02-17 LAB — PSA: PROSTATE SPECIFIC AG, SERUM: 0.9 ng/mL (ref 0.0–4.0)

## 2018-02-22 ENCOUNTER — Ambulatory Visit (INDEPENDENT_AMBULATORY_CARE_PROVIDER_SITE_OTHER): Payer: 59 | Admitting: Family Medicine

## 2018-02-22 ENCOUNTER — Encounter: Payer: Self-pay | Admitting: Family Medicine

## 2018-02-22 VITALS — BP 150/90 | Ht 73.0 in | Wt 173.0 lb

## 2018-02-22 DIAGNOSIS — Z0000 Encounter for general adult medical examination without abnormal findings: Secondary | ICD-10-CM

## 2018-02-22 DIAGNOSIS — Z23 Encounter for immunization: Secondary | ICD-10-CM | POA: Diagnosis not present

## 2018-02-22 DIAGNOSIS — I10 Essential (primary) hypertension: Secondary | ICD-10-CM | POA: Diagnosis not present

## 2018-02-22 MED ORDER — ENALAPRIL MALEATE 5 MG PO TABS: 5.0000 mg | ORAL_TABLET | Freq: Every day | ORAL | 1 refills | Status: DC

## 2018-02-22 MED ORDER — PANTOPRAZOLE SODIUM 40 MG PO TBEC: 40.0000 mg | DELAYED_RELEASE_TABLET | Freq: Every day | ORAL | 3 refills | Status: DC

## 2018-02-22 NOTE — Progress Notes (Signed)
Subjective:    Patient ID: Billy Mccormick, male    DOB: August 16, 1955, 62 y.o.   MRN: [id number]  HPI   The patient comes in today for a wellness visit.    A review of their health history was completed.  A review of medications was also completed.  Any needed refills; No  Eating habits: Eats ok semi healthy  Falls/  MVA accidents in past few months: No  Regular exercise: No  Specialist pt sees on regular basis: No  Preventative health issues were discussed.  Results for orders placed or performed in visit on 01/31/18  PSA  Result Value Ref Range   Prostate Specific Ag, Serum 0.9 0.0 - 4.0 ng/mL  Basic Metabolic Panel (BMET)  Result Value Ref Range   Glucose 96 65 - 99 mg/dL   BUN 13 8 - 27 mg/dL   Creatinine, Ser 0.90 0.76 - 1.27 mg/dL   GFR calc non Af Amer 92 >59 mL/min/1.73   GFR calc Af Amer 106 >59 mL/min/1.73   BUN/Creatinine Ratio 14 10 - 24   Sodium 141 134 - 144 mmol/L   Potassium 4.9 3.5 - 5.2 mmol/L   Chloride 105 96 - 106 mmol/L   CO2 22 20 - 29 mmol/L   Calcium 9.3 8.6 - 10.2 mg/dL  Lipid Profile  Result Value Ref Range   Cholesterol, Total 183 100 - 199 mg/dL   Triglycerides 82 0 - 149 mg/dL   HDL 39 (L) >39 mg/dL   VLDL Cholesterol Cal 16 5 - 40 mg/dL   LDL Calculated 128 (H) 0 - 99 mg/dL   Chol/HDL Ratio 4.7 0.0 - 5.0 ratio  Hepatic function panel  Result Value Ref Range   Total Protein 6.6 6.0 - 8.5 g/dL   Albumin 4.1 3.6 - 4.8 g/dL   Bilirubin Total 0.2 0.0 - 1.2 mg/dL   Bilirubin, Direct 0.08 0.00 - 0.40 mg/dL   Alkaline Phosphatase 55 39 - 117 IU/L   AST 20 0 - 40 IU/L   ALT 15 0 - 44 IU/L   sytays active does a lot of walkin  Blood pressure medicine and blood pressure levels reviewed today with patient. Compliant with blood pressure medicine. States does not miss a dose. No obvious side effects. Blood pressure generally good when checked elsewhere. Watching salt intake.    Additional concerns: No  Review of Systems    Constitutional: Negative for activity change, appetite change and fever.  HENT: Negative for congestion and rhinorrhea.   Eyes: Negative for discharge.  Respiratory: Negative for cough and wheezing.   Cardiovascular: Negative for chest pain.  Gastrointestinal: Negative for abdominal pain, blood in stool and vomiting.  Genitourinary: Negative for difficulty urinating and frequency.  Musculoskeletal: Negative for neck pain.  Skin: Negative for rash.  Allergic/Immunologic: Negative for environmental allergies and food allergies.  Neurological: Negative for weakness and headaches.  Psychiatric/Behavioral: Negative for agitation.  All other systems reviewed and are negative.  Pt declines the shingrex vaccine    Objective:   Physical Exam  Constitutional: He appears well-developed and well-nourished.  HENT:  Head: Normocephalic and atraumatic.  Right Ear: External ear normal.  Left Ear: External ear normal.  Nose: Nose normal.  Mouth/Throat: Oropharynx is clear and moist.  Eyes: Pupils are equal, round, and reactive to light. EOM are normal.  Neck: Normal range of motion. Neck supple. No thyromegaly present.  Cardiovascular: Normal rate, regular rhythm and normal heart sounds.  No murmur heard. Pulmonary/Chest:  Effort normal and breath sounds normal. No respiratory distress. He has no wheezes.  Abdominal: Soft. Bowel sounds are normal. He exhibits no distension and no mass. There is no tenderness.  Genitourinary: Penis normal.  Musculoskeletal: Normal range of motion. He exhibits no edema.  Lymphadenopathy:    He has no cervical adenopathy.  Neurological: He is alert. He exhibits normal muscle tone.  Skin: Skin is warm and dry. No erythema.  Psychiatric: He has a normal mood and affect. His behavior is normal. Judgment normal.  Vitals reviewed.         Assessment & Plan:  #1 wellness exam diet discussed.  Exercise discussed.  Tdap discussed and initiated colonoscopy due in  nine yrs las 2018.Marland Kitchen  Smoking about the same    Shingles vaccine  Patient declines  2.  Hypertension blood pressure improved on repeat.  Diet discussed.  Exercise discussed maintain same dose  3.  Hyperlipidemia.  With multiple risk factors advised statin patient wishes to hold off at this time  Follow-up in 6 months diet exercise discussed meds refilled Tdap today

## 2018-05-23 ENCOUNTER — Encounter: Payer: Self-pay | Admitting: Family

## 2018-05-23 ENCOUNTER — Other Ambulatory Visit: Payer: Self-pay

## 2018-05-23 ENCOUNTER — Ambulatory Visit (INDEPENDENT_AMBULATORY_CARE_PROVIDER_SITE_OTHER): Payer: 59 | Admitting: Family

## 2018-05-23 ENCOUNTER — Ambulatory Visit (HOSPITAL_COMMUNITY)
Admission: RE | Admit: 2018-05-23 | Discharge: 2018-05-23 | Disposition: A | Discharge status: Home / Self Care | Payer: 59 | Source: Ambulatory Visit | Attending: Family | Admitting: Family

## 2018-05-23 VITALS — BP 142/92 | HR 61 | Temp 97.3°F | Resp 18 | Ht 73.0 in | Wt 176.0 lb

## 2018-05-23 DIAGNOSIS — I714 Abdominal aortic aneurysm, without rupture, unspecified: Secondary | ICD-10-CM

## 2018-05-23 DIAGNOSIS — F17200 Nicotine dependence, unspecified, uncomplicated: Secondary | ICD-10-CM | POA: Diagnosis not present

## 2018-05-23 NOTE — Patient Instructions (Signed)
Before your next abdominal ultrasound:  Avoid gas forming foods and beverages the day before the test.   Take two Extra-Strength Gas-X capsules at bedtime the night before the test. Take another two Extra-Strength Gas-X capsules in the middle of the night if you get up to the restroom, if not, first thing in the morning with water.  Do not chew gum.     Steps to Quit Smoking Smoking tobacco can be bad for your health. It can also affect almost every organ in your body. Smoking puts you and people around you at risk for many serious long-lasting (chronic) diseases. Quitting smoking is hard, but it is one of the best things that you can do for your health. It is never too late to quit. What are the benefits of quitting smoking? When you quit smoking, you lower your risk for getting serious diseases and conditions. They can include:  Lung cancer or lung disease.  Heart disease.  Stroke.  Heart attack.  Not being able to have children (infertility).  Weak bones (osteoporosis) and broken bones (fractures).  If you have coughing, wheezing, and shortness of breath, those symptoms may get better when you quit. You may also get sick less often. If you are pregnant, quitting smoking can help to lower your chances of having a baby of low birth weight. What can I do to help me quit smoking? Talk with your doctor about what can help you quit smoking. Some things you can do (strategies) include:  Quitting smoking totally, instead of slowly cutting back how much you smoke over a period of time.  Going to in-person counseling. You are more likely to quit if you go to many counseling sessions.  Using resources and support systems, such as: ? Online chats with a counselor. ? Phone quitlines. ? Printed self-help materials. ? Support groups or group counseling. ? Text messaging programs. ? Mobile phone apps or applications.  Taking medicines. Some of these medicines may have nicotine in them.  If you are pregnant or breastfeeding, do not take any medicines to quit smoking unless your doctor says it is okay. Talk with your doctor about counseling or other things that can help you.  Talk with your doctor about using more than one strategy at the same time, such as taking medicines while you are also going to in-person counseling. This can help make quitting easier. What things can I do to make it easier to quit? Quitting smoking might feel very hard at first, but there is a lot that you can do to make it easier. Take these steps:  Talk to your family and friends. Ask them to support and encourage you.  Call phone quitlines, reach out to support groups, or work with a counselor.  Ask people who smoke to not smoke around you.  Avoid places that make you want (trigger) to smoke, such as: ? Bars. ? Parties. ? Smoke-break areas at work.  Spend time with people who do not smoke.  Lower the stress in your life. Stress can make you want to smoke. Try these things to help your stress: ? Getting regular exercise. ? Deep-breathing exercises. ? Yoga. ? Meditating. ? Doing a body scan. To do this, close your eyes, focus on one area of your body at a time from head to toe, and notice which parts of your body are tense. Try to relax the muscles in those areas.  Download or buy apps on your mobile phone or tablet that can   can help you stick to your quit plan. There are many free apps, such as QuitGuide from the CDC (Centers for Disease Control and Prevention). You can find more support from smokefree.gov and other websites.  This information is not intended to replace advice given to you by your health care provider. Make sure you discuss any questions you have with your health care provider. Document Released: 04/29/2009 Document Revised: 02/29/2016 Document Reviewed: 11/17/2014 Elsevier Interactive Patient Education  2018 Elsevier Inc.     Abdominal Aortic Aneurysm Blood pumps away from  the heart through tubes (blood vessels) called arteries. Aneurysms are weak or damaged places in the wall of an artery. It bulges out like a balloon. An abdominal aortic aneurysm happens in the main artery of the body (aorta). It can burst or tear, causing bleeding inside the body. This is an emergency. It needs treatment right away. What are the causes? The exact cause is unknown. Things that could cause this problem include:  Fat and other substances building up in the lining of a tube.  Swelling of the walls of a blood vessel.  Certain tissue diseases.  Belly (abdominal) trauma.  An infection in the main artery of the body.  What increases the risk? There are things that make it more likely for you to have an aneurysm. These include:  Being over the age of 62 years old.  Having high blood pressure (hypertension).  Being a male.  Being white.  Being very overweight (obese).  Having a family history of aneurysm.  Using tobacco products.  What are the signs or symptoms? Symptoms depend on the size of the aneurysm and how fast it grows. There may not be symptoms. If symptoms occur, they can include:  Pain (belly, side, lower back, or groin).  Feeling full after eating a small amount of food.  Feeling sick to your stomach (nauseous), throwing up (vomiting), or both.  Feeling a lump in your belly that feels like it is beating (pulsating).  Feeling like you will pass out (faint).  How is this treated?  Medicine to control blood pressure and pain.  Imaging tests to see if the aneurysm gets bigger.  Surgery. How is this prevented? To lessen your chance of getting this condition:  Stop smoking. Stop chewing tobacco.  Limit or avoid alcohol.  Keep your blood pressure, blood sugar, and cholesterol within normal limits.  Eat less salt.  Eat foods low in saturated fats and cholesterol. These are found in animal and whole dairy products.  Eat more fiber. Fiber is  found in whole grains, vegetables, and fruits.  Keep a healthy weight.  Stay active and exercise often.  This information is not intended to replace advice given to you by your health care provider. Make sure you discuss any questions you have with your health care provider. Document Released: 10/28/2012 Document Revised: 12/09/2015 Document Reviewed: 08/02/2012 Elsevier Interactive Patient Education  2017 Elsevier Inc.  

## 2018-05-23 NOTE — Progress Notes (Signed)
[facility]   CC: Follow up Abdominal Aortic Aneurysm  History of Present Illness  Billy Mccormick is a 62 y.o. (31-May-1956) male whom Dr. Donnetta Hutching has been monitoringwith anasymptomatic abdominal aortic aneurysm. This was discovered incidentally on lumbarspineMRI in 2016. Maximal diameter that time was 3.9cm. He had an ultrasound in our office on 08/11/2014 predicting 3.3 cm maximal diameter. He has no symptoms referable to this.  The patientdeniesclaudication in his legs with walking. He denies any history of stroke or TIA symptoms.  Diabetic:No Tobacco NKN:[id number] (1ppd, started at age 67yrs)   Past Medical History:  Diagnosis Date  . Degenerative cervical disc   . Essential hypertension 10/02/2015  . Hyperlipidemia   . IFG (impaired fasting glucose)   . Lumbar herniated disc 2015   L4,L5  . Psoriasis    Past Surgical History:  Procedure Laterality Date  . ACHILLES TENDON REPAIR     right  . COLONOSCOPY  06/25/2006   Dr. Gala Romney: extensive left-sided diverticula, 2 six to seven mm pale-appearing hyperplastic-appearing polypoid lesions vs atypcial appearing lipoma. (bx-hyperplastic polyps). next TCS 06/2016  . COLONOSCOPY N/A 06/21/2017   Procedure: COLONOSCOPY;  Surgeon: Daneil Dolin, MD;  [facility]: [facility];  Service: Endoscopy;  Laterality: N/A;  9:45 AM  . ESOPHAGOGASTRODUODENOSCOPY N/A 11/28/2013   ALP:[id number] Schatzki's ring  not manipulated. Small hiatal hernia. Gastric ulcer and erosions as above. Bx antrum-extensive intestinal metaplasia, no H.pylori  . ESOPHAGOGASTRODUODENOSCOPY N/A 05/26/2014   Procedure: ESOPHAGOGASTRODUODENOSCOPY (EGD);  Surgeon: Daneil Dolin, MD;  Location: [facility];  Service: Endoscopy;  Laterality: N/A;  7:30AM  . HERNIA REPAIR     x 2  . MALONEY DILATION N/A 11/28/2013   Procedure: Venia Minks DILATION;  Surgeon: Daneil Dolin, MD;  Location: [facility];  Service: Endoscopy;   Laterality: N/A;  . NOSE SURGERY     x 2  . POLYPECTOMY  06/21/2017   Procedure: POLYPECTOMY;  Surgeon: Daneil Dolin, MD;  Location: [facility];  Service: Endoscopy;;  colon  . SAVORY DILATION N/A 11/28/2013   Procedure: SAVORY DILATION;  Surgeon: Daneil Dolin, MD;  Location: [facility];  Service: Endoscopy;  Laterality: N/A;   Social History Social History   Socioeconomic History  . Marital status: Married    Spouse name: Not on file  . Number of children: 0  . Years of education: Not on file  . Highest education level: Not on file  Occupational History  . Not on file  Social Needs  . Financial resource strain: Not on file  . Food insecurity:    Worry: Not on file    Inability: Not on file  . Transportation needs:    Medical: Not on file    Non-medical: Not on file  Tobacco Use  . Smoking status: Current Every Day Smoker    Packs/day: 1.00    Years: 35.00    Pack years: 35.00    Types: Cigarettes  . Smokeless tobacco: Never Used  . Tobacco comment: currently smokes 3/4 pack daily   Substance and Sexual Activity  . Alcohol use: Yes    Alcohol/week: 10.0 standard drinks    Types: 7 Cans of beer, 3 Shots of liquor per week    Comment: 6 beers about 3 days weekly  . Drug use: No  . Sexual activity: Not on file  Lifestyle  . Physical activity:    Days per week: Not on file  Minutes per session: Not on file  . Stress: Not on file  Relationships  . Social connections:    Talks on phone: Not on file    Gets together: Not on file    Attends religious service: Not on file    Active member of club or organization: Not on file    Attends meetings of clubs or organizations: Not on file    Relationship status: Not on file  . Intimate partner violence:    Fear of current or ex partner: Not on file    Emotionally abused: Not on file    Physically abused: Not on file    Forced sexual activity: Not on file  Other Topics Concern  . Not on file  Social History  Narrative  . Not on file   Family History Family History  Problem Relation Age of Onset  . Hyperlipidemia Mother   . Hyperlipidemia Father   . Kidney disease Brother   . Colon cancer Neg Hx     Current Outpatient Medications on File Prior to Visit  Medication Sig Dispense Refill  . enalapril (VASOTEC) 5 MG tablet Take 1 tablet (5 mg total) by mouth daily. 90 tablet 1  . pantoprazole (PROTONIX) 40 MG tablet Take 1 tablet (40 mg total) by mouth daily. 90 tablet 3   No current facility-administered medications on file prior to visit.    Allergies  Allergen Reactions  . Prednisone     Side effects    ROS: See HPI for pertinent positives and negatives.  Physical Examination  Vitals:   05/23/18 0839  BP: (!) 142/92  Pulse: 61  Resp: 18  Temp: (!) 97.3 F (36.3 C)  TempSrc: Oral  SpO2: 99%  Weight: 176 lb (79.8 kg)  Height: 6\' 1"  (1.854 m)   Body mass index is 23.22 kg/m.  General: A&O x 3, WD,male. HEENT: WNL, no gross abnormalities  Pulmonary: Sym exp, respirations are non labored, good air movt, CTAB, no rales, rhonchi, or wheezing. Cardiac: RRR, Nl S1, S2, nodetectedmurmur.  Carotid Bruits Right Left   Negative Negative   Abdominal aortic pulse is notpalpable Radial pulses are2+ palpable and =  VASCULAR EXAM:  LE Pulses Right Left  FEMORAL 2+palpable 2+palpable   POPLITEAL notpalpable  notpalpable  POSTERIOR TIBIAL 2+palpable  2+palpable   DORSALIS PEDIS ANTERIOR TIBIAL notpalpable  notpalpable     Gastrointestinal: soft, NTND, -G/R, - HSM, - masses palpated, - CVAT B. Musculoskeletal: M/S 5/5 throughout, Extremities without ischemic changes. Neurologic: CN 2-12 intact, Pain and light touch intact in extremities are intact, Motor exam as listed above. Skin: No rashes, no ulcers, no cellulitis.   Psychiatric: Normal thought content, mood appropriate to clinical  situation.     DATA  AAA Duplex:  Previous size: 4.5 cm (Date: 11/15/17); Right CIA: 1.0 cm; Left CIA: 1.1 cm   Current size:  4.45 cm (Date: 05-23-18); Right CIA: 1.1 cm; Left CIA: 1.2 cm  Medical Decision Making  The patient is a 62 y.o. male who presents with asymptomatic AAA with no increase in size, at 4.45 cm today.   Based on this patient's exam and diagnostic studies, the patient will follow up in 9 months with the following studies: AAA duplex.  Consideration for repair of AAA would be made when the size is 5.0 cm, growth > 1 cm/yr, and symptomatic status.  Patient was counseled re smoking cessation and was given several free resources re smoking cessation.  The patient was given information about AAA including signs, symptoms, treatment, and how to minimize the risk of enlargement and rupture of aneurysms.    I emphasized the importance of maximal medical management including strict control of blood pressure, blood glucose, and lipid levels, antiplatelet agents, obtaining regular exercise, and cessation of smoking.   The patient was advised to call 911 should the patient experience sudden onset abdominal or back pain.   Thank you for allowing [place] to participate in this patient's care.  Clemon Chambers, RN, MSN, FNP-C [facility] Office: [facility] Physician: Oneida Alar  05/23/2018, 8:54 AM

## 2018-08-21 ENCOUNTER — Ambulatory Visit: Payer: 59 | Admitting: Family Medicine

## 2018-08-21 VITALS — BP 128/86 | Ht 73.0 in | Wt 183.0 lb

## 2018-08-21 DIAGNOSIS — I10 Essential (primary) hypertension: Secondary | ICD-10-CM

## 2018-08-21 MED ORDER — ENALAPRIL MALEATE 5 MG PO TABS: 5.0000 mg | ORAL_TABLET | Freq: Every day | ORAL | 1 refills | Status: DC

## 2018-08-21 NOTE — Progress Notes (Signed)
   Subjective:    Patient ID: Judie Petit, male    DOB: [birth year], 63 y.o.   MRN: [id number]  Hypertension  This is a chronic problem. The current episode started more than 1 year ago. Risk factors for coronary artery disease include male gender. Treatments tried: vasotec. There are no compliance problems.    Blood pressure medicine and blood pressure levels reviewed today with patient. Compliant with blood pressure medicine. States does not miss a dose. No obvious side effects. Blood pressure generally good when checked elsewhere. Watching salt intake.  Staying active walking    Daily at work and does not rigd things at he m     No problems or concerns per patient  Review of Systems No headache, no major weight loss or weight gain, no chest pain no back pain abdominal pain no change in bowel habits complete ROS otherwise negative     Objective:   Physical Exam Alert vitals stable, NAD. Blood pressure good on repeat. HEENT normal. Lungs clear. Heart regular rate and rhythm.       Assessment & Plan:  Impression hyper attention.  Good control discussed.  Patient to maintain same meds.  Diet exercise discussed.  Follow-up in 6 months for wellness plus chronic

## 2018-12-26 ENCOUNTER — Other Ambulatory Visit: Payer: Self-pay | Admitting: Acute Care

## 2018-12-26 DIAGNOSIS — Z122 Encounter for screening for malignant neoplasm of respiratory organs: Secondary | ICD-10-CM

## 2018-12-26 DIAGNOSIS — F17210 Nicotine dependence, cigarettes, uncomplicated: Secondary | ICD-10-CM

## 2019-01-03 ENCOUNTER — Other Ambulatory Visit: Payer: Self-pay

## 2019-01-03 ENCOUNTER — Ambulatory Visit (HOSPITAL_COMMUNITY)
Admission: RE | Admit: 2019-01-03 | Discharge: 2019-01-03 | Disposition: A | Discharge status: Home / Self Care | Payer: 59 | Source: Ambulatory Visit | Attending: Acute Care | Admitting: Acute Care

## 2019-01-03 DIAGNOSIS — Z122 Encounter for screening for malignant neoplasm of respiratory organs: Secondary | ICD-10-CM

## 2019-01-03 DIAGNOSIS — F17210 Nicotine dependence, cigarettes, uncomplicated: Secondary | ICD-10-CM

## 2019-01-14 ENCOUNTER — Other Ambulatory Visit: Payer: Self-pay | Admitting: *Deleted

## 2019-01-14 DIAGNOSIS — Z122 Encounter for screening for malignant neoplasm of respiratory organs: Secondary | ICD-10-CM

## 2019-01-14 DIAGNOSIS — F17210 Nicotine dependence, cigarettes, uncomplicated: Secondary | ICD-10-CM

## 2019-02-06 ENCOUNTER — Other Ambulatory Visit: Payer: Self-pay | Admitting: Family Medicine

## 2019-02-18 ENCOUNTER — Other Ambulatory Visit: Payer: Self-pay

## 2019-02-18 DIAGNOSIS — I714 Abdominal aortic aneurysm, without rupture, unspecified: Secondary | ICD-10-CM

## 2019-02-21 ENCOUNTER — Ambulatory Visit (HOSPITAL_COMMUNITY)
Admission: RE | Admit: 2019-02-21 | Discharge: 2019-02-21 | Disposition: A | Discharge status: Home / Self Care | Payer: 59 | Source: Ambulatory Visit | Attending: Family | Admitting: Family

## 2019-02-21 ENCOUNTER — Encounter: Payer: Self-pay | Admitting: Family

## 2019-02-21 ENCOUNTER — Other Ambulatory Visit: Payer: Self-pay

## 2019-02-21 ENCOUNTER — Ambulatory Visit: Payer: 59 | Admitting: Family

## 2019-02-21 ENCOUNTER — Telehealth: Payer: Self-pay | Admitting: Family Medicine

## 2019-02-21 VITALS — BP 126/84 | HR 79 | Temp 97.4°F | Resp 20 | Ht 73.0 in | Wt 174.0 lb

## 2019-02-21 DIAGNOSIS — I714 Abdominal aortic aneurysm, without rupture, unspecified: Secondary | ICD-10-CM

## 2019-02-21 DIAGNOSIS — F17200 Nicotine dependence, unspecified, uncomplicated: Secondary | ICD-10-CM | POA: Diagnosis not present

## 2019-02-21 DIAGNOSIS — E785 Hyperlipidemia, unspecified: Secondary | ICD-10-CM

## 2019-02-21 DIAGNOSIS — Z125 Encounter for screening for malignant neoplasm of prostate: Secondary | ICD-10-CM

## 2019-02-21 DIAGNOSIS — Z79899 Other long term (current) drug therapy: Secondary | ICD-10-CM

## 2019-02-21 DIAGNOSIS — I10 Essential (primary) hypertension: Secondary | ICD-10-CM

## 2019-02-21 NOTE — Patient Instructions (Signed)
Before your next abdominal ultrasound:  Avoid gas forming foods and beverages the day before the test.   Take two Extra-Strength Gas-X capsules at bedtime the night before the test. Take another two Extra-Strength Gas-X capsules in the middle of the night if you get up to the restroom, if not, first thing in the morning with water.  Do not chew gum.     Abdominal Aortic Aneurysm  An aneurysm is a bulge in one of the blood vessels that carry blood away from the heart (artery). It happens when blood pushes up against a weak or damaged place in the wall of an artery. An abdominal aortic aneurysm happens in the main artery of the body (aorta). Some aneurysms may not cause problems. If it grows, it can burst or tear, causing bleeding inside the body. This is an emergency. It needs to be treated right away. What are the causes? The exact cause of this condition is not known. What increases the risk? The following may make you more likely to get this condition:  Being a male who is 60 years of age or older.  Being white (Caucasian).  Using tobacco.  Having a family history of aneurysms.  Having the following conditions: ? Hardening of the arteries (arteriosclerosis). ? Inflammation of the walls of an artery (arteritis). ? Certain genetic conditions. ? Being very overweight (obesity). ? An infection in the wall of the aorta (infectious aortitis). ? High cholesterol. ? High blood pressure (hypertension). What are the signs or symptoms? Symptoms depend on the size of the aneurysm and how fast it is growing. Most grow slowly and do not cause any symptoms. If symptoms do occur, they may include:  Pain in the belly (abdomen), side, or back.  Feeling full after eating only small amounts of food.  Feeling a throbbing lump in the belly. Symptoms that the aneurysm has burst (ruptured) include:  Sudden, very bad pain in the belly, side, or back.  Feeling sick to your stomach (nauseous).   Throwing up (vomiting).  Feeling light-headed or passing out. How is this treated? Treatment for this condition depends on:  The size of the aneurysm.  How fast it is growing.  Your age.  Your risk of having it burst. If your aneurysm is smaller than 2 inches (5 cm), your doctor may manage it by:  Checking it often to see if it is getting bigger. You may have an imaging test (ultrasound) to check it every 3-6 months, every year, or every few years.  Giving you medicines to: ? Control blood pressure. ? Treat pain. ? Fight infection. If your aneurysm is larger than 2 inches (5 cm), you may need surgery to fix it. Follow these instructions at home: Lifestyle  Do not use any products that have nicotine or tobacco in them. This includes cigarettes, e-cigarettes, and chewing tobacco. If you need help quitting, ask your doctor.  Get regular exercise. Ask your doctor what types of exercise are best for you. Eating and drinking  Eat a heart-healthy diet. This includes eating plenty of: ? Fresh fruits and vegetables. ? Whole grains. ? Low-fat (lean) protein. ? Low-fat dairy products.  Avoid foods that are high in saturated fat and cholesterol. These foods include red meat and some dairy products.  Do not drink alcohol if: ? Your doctor tells you not to drink. ? You are pregnant, may be pregnant, or are planning to become pregnant.  If you drink alcohol: ? Limit how much you use   to:  0-1 drink a day for women.  0-2 drinks a day for men. ? Be aware of how much alcohol is in your drink. In the [place] one drink equals any of these:  One typical bottle of beer (12 oz).  One-half glass of wine (5 oz).  One shot of hard liquor (1 oz). General instructions  Take over-the-counter and prescription medicines only as told by your doctor.  Keep your blood pressure within normal limits. Ask your doctor what your blood pressure should be.  Have your blood sugar (glucose) level  and cholesterol levels checked regularly. Keep your blood sugar level and cholesterol levels within normal limits.  Avoid heavy lifting and activities that take a lot of effort. Ask your doctor what activities are safe for you.  Keep all follow-up visits as told by your doctor. This is important. ? Talk to your doctor about regular screenings to see if the aneurysm is getting bigger. Contact a doctor if you:  Have pain in your belly, side, or back.  Have a throbbing feeling in your belly.  Have a family history of aneurysms. Get help right away if you:  Have sudden, bad pain in your belly, side, or back.  Feel sick to your stomach.  Throw up.  Have trouble pooping (constipation).  Have trouble peeing (urinating).  Feel light-headed.  Have a fast heart rate when you stand.  Have sweaty skin that is cold to the touch (clammy).  Have shortness of breath.  Have a fever. These symptoms may be an emergency. Do not wait to see if the symptoms will go away. Get medical help right away. Call your local emergency services (911 in the [place] Do not drive yourself to the hospital. Summary  An aneurysm is a bulge in one of the blood vessels that carry blood away from the heart (artery). Some aneurysms may not cause problems.  You may need to have yours checked often. If it grows, it can burst or tear. This causes bleeding inside the body. It needs to be treated right away.  Follow instructions from your doctor about healthy lifestyle changes.  Keep all follow-up visits as told by your doctor. This is important. This information is not intended to replace advice given to you by your health care provider. Make sure you discuss any questions you have with your health care provider. Document Released: 10/28/2012 Document Revised: 10/21/2018 Document Reviewed: 02/09/2018 Elsevier Patient Education  2020 Elsevier Inc.     Steps to Quit Smoking Smoking tobacco is the leading cause of  preventable death. It can affect almost every organ in the body. Smoking puts you and people around you at risk for many serious, long-lasting (chronic) diseases. Quitting smoking can be hard, but it is one of the best things that you can do for your health. It is never too late to quit. How do I get ready to quit? When you decide to quit smoking, make a plan to help you succeed. Before you quit:  Pick a date to quit. Set a date within the next 2 weeks to give you time to prepare.  Write down the reasons why you are quitting. Keep this list in places where you will see it often.  Tell your family, friends, and co-workers that you are quitting. Their support is important.  Talk with your doctor about the choices that may help you quit.  Find out if your health insurance will pay for these treatments.  Know the people, places,   things, and activities that make you want to smoke (triggers). Avoid them. What first steps can I take to quit smoking?  Throw away all cigarettes at home, at work, and in your car.  Throw away the things that you use when you smoke, such as ashtrays and lighters.  Clean your car. Make sure to empty the ashtray.  Clean your home, including curtains and carpets. What can I do to help me quit smoking? Talk with your doctor about taking medicines and seeing a counselor at the same time. You are more likely to succeed when you do both.  If you are pregnant or breastfeeding, talk with your doctor about counseling or other ways to quit smoking. Do not take medicine to help you quit smoking unless your doctor tells you to do so. To quit smoking: Quit right away  Quit smoking totally, instead of slowly cutting back on how much you smoke over a period of time.  Go to counseling. You are more likely to quit if you go to counseling sessions regularly. Take medicine You may take medicines to help you quit. Some medicines need a prescription, and some you can buy  over-the-counter. Some medicines may contain a drug called nicotine to replace the nicotine in cigarettes. Medicines may:  Help you to stop having the desire to smoke (cravings).  Help to stop the problems that come when you stop smoking (withdrawal symptoms). Your doctor may ask you to use:  Nicotine patches, gum, or lozenges.  Nicotine inhalers or sprays.  Non-nicotine medicine that is taken by mouth. Find resources Find resources and other ways to help you quit smoking and remain smoke-free after you quit. These resources are most helpful when you use them often. They include:  Online chats with a counselor.  Phone quitlines.  Printed self-help materials.  Support groups or group counseling.  Text messaging programs.  Mobile phone apps. Use apps on your mobile phone or tablet that can help you stick to your quit plan. There are many free apps for mobile phones and tablets as well as websites. Examples include Quit Guide from the CDC and smokefree.gov  What things can I do to make it easier to quit?   Talk to your family and friends. Ask them to support and encourage you.  Call a phone quitline (1-800-QUIT-NOW), reach out to support groups, or work with a counselor.  Ask people who smoke to not smoke around you.  Avoid places that make you want to smoke, such as: ? Bars. ? Parties. ? Smoke-break areas at work.  Spend time with people who do not smoke.  Lower the stress in your life. Stress can make you want to smoke. Try these things to help your stress: ? Getting regular exercise. ? Doing deep-breathing exercises. ? Doing yoga. ? Meditating. ? Doing a body scan. To do this, close your eyes, focus on one area of your body at a time from head to toe. Notice which parts of your body are tense. Try to relax the muscles in those areas. How will I feel when I quit smoking? Day 1 to 3 weeks Within the first 24 hours, you may start to have some problems that come from  quitting tobacco. These problems are very bad 2-3 days after you quit, but they do not often last for more than 2-3 weeks. You may get these symptoms:  Mood swings.  Feeling restless, nervous, angry, or annoyed.  Trouble concentrating.  Dizziness.  Strong desire for high-sugar   foods and nicotine.  Weight gain.  Trouble pooping (constipation).  Feeling like you may vomit (nausea).  Coughing or a sore throat.  Changes in how the medicines that you take for other issues work in your body.  Depression.  Trouble sleeping (insomnia). Week 3 and afterward After the first 2-3 weeks of quitting, you may start to notice more positive results, such as:  Better sense of smell and taste.  Less coughing and sore throat.  Slower heart rate.  Lower blood pressure.  Clearer skin.  Better breathing.  Fewer sick days. Quitting smoking can be hard. Do not give up if you fail the first time. Some people need to try a few times before they succeed. Do your best to stick to your quit plan, and talk with your doctor if you have any questions or concerns. Summary  Smoking tobacco is the leading cause of preventable death. Quitting smoking can be hard, but it is one of the best things that you can do for your health.  When you decide to quit smoking, make a plan to help you succeed.  Quit smoking right away, not slowly over a period of time.  When you start quitting, seek help from your doctor, family, or friends. This information is not intended to replace advice given to you by your health care provider. Make sure you discuss any questions you have with your health care provider. Document Released: 04/29/2009 Document Revised: 09/20/2018 Document Reviewed: 09/21/2018 Elsevier Patient Education  2020 Elsevier Inc.  

## 2019-02-21 NOTE — Telephone Encounter (Signed)
Patient has appointment for physical on 8/12 and needing labs done

## 2019-02-21 NOTE — Progress Notes (Signed)
[facility] [place]   CC: Follow up Abdominal Aortic Aneurysm  History of Present Illness  Billy Mccormick is a 63 y.o. (Dec 24, 1955) male whom Dr. Donnetta Hutching has been monitoringwith anasymptomatic abdominal aortic aneurysm. This was discovered incidentally on lumbarspineMRI in 2016. Maximal diameter that time was 3.9cm. He had an ultrasound in our office on 08/11/2014 predicting 3.3 cm maximal diameter. He has no symptoms referable to this.  The patientdeniesclaudication type symptoms inhislegs with walking. He denies anyhistory of stroke or TIA symptoms.  Diabetic:No Tobacco IWL:NLGXQJ (1ppd, started at age 42yrs)   Past Medical History:  Diagnosis Date  . Degenerative cervical disc   . Essential hypertension 10/02/2015  . Hyperlipidemia   . IFG (impaired fasting glucose)   . Lumbar herniated disc 2015   L4,L5  . Psoriasis    Past Surgical History:  Procedure Laterality Date  . ACHILLES TENDON REPAIR     right  . COLONOSCOPY  06/25/2006   Dr. Gala Romney: extensive left-sided diverticula, 2 six to seven mm pale-appearing hyperplastic-appearing polypoid lesions vs atypcial appearing lipoma. (bx-hyperplastic polyps). next TCS 06/2016  . COLONOSCOPY N/A 06/21/2017   Procedure: COLONOSCOPY;  Surgeon: Daneil Dolin, MD;  [facility];  Service: Endoscopy;  Laterality: N/A;  9:45 AM  . ESOPHAGOGASTRODUODENOSCOPY N/A 11/28/2013   JHE:RDEYCXKGYJE Schatzki's ring  not manipulated. Small hiatal hernia. Gastric ulcer and erosions as above. Bx antrum-extensive intestinal metaplasia, no H.pylori  . ESOPHAGOGASTRODUODENOSCOPY N/A 05/26/2014   Procedure: ESOPHAGOGASTRODUODENOSCOPY (EGD);  Surgeon: Daneil Dolin, MD;  [facility]: [facility];  Service: Endoscopy;  Laterality: N/A;  7:30AM  . HERNIA REPAIR     x 2  . MALONEY DILATION N/A 11/28/2013   Procedure: Venia Minks DILATION;  Surgeon: Daneil Dolin, MD;  Location: [facility];  Service:  Endoscopy;  Laterality: N/A;  . NOSE SURGERY     x 2  . POLYPECTOMY  06/21/2017   Procedure: POLYPECTOMY;  Surgeon: Daneil Dolin, MD;  [facility]: [facility];  Service: Endoscopy;;  colon  . SAVORY DILATION N/A 11/28/2013   Procedure: SAVORY DILATION;  Surgeon: Daneil Dolin, MD;  Location: [facility];  Service: Endoscopy;  Laterality: N/A;   Social History Social History   Socioeconomic History  . Marital status: Married    Spouse name: Not on file  . Number of children: 0  . Years of education: Not on file  . Highest education level: Not on file  Occupational History  . Not on file  Social Needs  . Financial resource strain: Not on file  . Food insecurity    Worry: Not on file    Inability: Not on file  . Transportation needs    Medical: Not on file    Non-medical: Not on file  Tobacco Use  . Smoking status: Current Every Day Smoker    Packs/day: 1.00    Years: 35.00    Pack years: 35.00    Types: Cigarettes  . Smokeless tobacco: Never Used  . Tobacco comment: currently smokes 3/4 pack daily   Substance and Sexual Activity  . Alcohol use: Yes    Alcohol/week: 10.0 standard drinks    Types: 7 Cans of beer, 3 Shots of liquor per week    Comment: 6 beers about 3 days weekly  . Drug use: No  . Sexual activity: Not on file  Lifestyle  . Physical activity    Days per week: Not on file    Minutes  per session: Not on file  . Stress: Not on file  Relationships  . Social Herbalist on phone: Not on file    Gets together: Not on file    Attends religious service: Not on file    Active member of club or organization: Not on file    Attends meetings of clubs or organizations: Not on file    Relationship status: Not on file  . Intimate partner violence    Fear of current or ex partner: Not on file    Emotionally abused: Not on file    Physically abused: Not on file    Forced sexual activity: Not on file  Other Topics Concern  . Not on file  Social  History Narrative  . Not on file   Family History Family History  Problem Relation Age of Onset  . Hyperlipidemia Mother   . Hyperlipidemia Father   . Kidney disease Brother   . Colon cancer Neg Hx     Current Outpatient Medications on File Prior to Visit  Medication Sig Dispense Refill  . enalapril (VASOTEC) 5 MG tablet TAKE 1 TABLET(5 MG) BY MOUTH DAILY 90 tablet 1  . pantoprazole (PROTONIX) 40 MG tablet Take 1 tablet (40 mg total) by mouth daily. 90 tablet 3   No current facility-administered medications on file prior to visit.    Allergies  Allergen Reactions  . Prednisone     Side effects    ROS: See HPI for pertinent positives and negatives.  Physical Examination  Vitals:   02/21/19 0822  BP: 126/84  Pulse: 79  Resp: 20  Temp: (!) 97.4 F (36.3 C)  SpO2: 97%  Weight: 174 lb (78.9 kg)  Height: 6\' 1"  (1.854 m)   Body mass index is 22.96 kg/m.  General: A&O x 3, WD, fit appearing male. HEENT: Grossly intact and WNL.  Pulmonary: Sym exp, respirations are non labored, fair air movement in all fields, no rales, rhonchi, or wheezing. Cardiac: Regular rhythm and rate, no detected murmur.  Carotid Bruits Right Left   Negative Negative   Adominal aortic pulse is mildly palpable Radial pulses are 2+ palpable                          VASCULAR EXAM:                                                                                                         LE Pulses Right Left       FEMORAL  2+ palpable  2+ palpable        POPLITEAL  1+ palpable   1+ palpable       POSTERIOR TIBIAL  2+ palpable   2+ palpable        DORSALIS PEDIS      ANTERIOR TIBIAL not palpable  not palpable     Gastrointestinal: soft, NTND, -G/R, - HSM, - masses palpated, - CVAT B. Musculoskeletal: M/S 5/5 throughout, Extremities without ischemic changes. Skin: No rashes, no ulcers,  no cellulitis.   Neurologic: CN 2-12 intact, Pain and light touch intact in extremities are intact, Motor  exam as listed above. Psychiatric: Normal thought content, mood appropriate to clinical situation.    DATA  AAA Duplex (02/21/2019):  Previous size: 4.45 cm (Date: 05-23-18); Right CIA: 1.1 cm; Left CIA: 1.2 cm   Current (Date: 02-21-19): Abdominal Aorta Findings: +-----------+-------+----------+----------+--------+--------+--------+ Location   AP (cm)Trans (cm)PSV (cm/s)WaveformThrombusComments +-----------+-------+----------+----------+--------+--------+--------+ Proximal   2.74   2.85      64                                 +-----------+-------+----------+----------+--------+--------+--------+ Mid        4.66   4.85      40                                 +-----------+-------+----------+----------+--------+--------+--------+ Distal     4.12   4.79      26                                 +-----------+-------+----------+----------+--------+--------+--------+ RT CIA Prox1.3    1.2       143                                +-----------+-------+----------+----------+--------+--------+--------+ LT CIA Prox1.2    1.2       243                                +-----------+-------+----------+----------+--------+--------+--------+  Summary: Abdominal Aorta: There is evidence of abnormal dilitation of the Mid and Distal Abdominal aorta. The largest aortic measurement is 4.8 cm. The largest aortic diameter has increased compared to prior exam. Previous diameter measurement was 4.5 cm obtained on 05/23/2018.   Medical Decision Making  The patient is a 63 y.o. male who presents with asymptomatic AAA with an increase in size to 4.85 cm today from 4.45 cm on 05-23-18.  His blood pressure remains in good control.  His primary risk factor that can be addressed for the growth of his AAA is his tobacco use.  Over 3 minutes was spent counseling patient re smoking cessation, and patient was given several free resources re smoking cessation.    Based on this  patient's exam and diagnostic studies, the patient will follow up in 6 months  with the following studies: AAA duplex.  Consideration for repair of AAA would be made when the size is 5.5cm, growth > 1 cm/yr, and symptomatic status.  Abdominal aortic aneurysm less than 5-1/2 cm in diameter has less then 1/2% risk of rupture per year.        The patient was given information about AAA including signs, symptoms, treatment, and how to minimize the risk of enlargement and rupture of aneurysms.    I emphasized the importance of maximal medical management including strict control of blood pressure, blood glucose, and lipid levels, antiplatelet agents, obtaining regular exercise, and  cessation of smoking.   The patient was advised to call 911 should the patient experience sudden onset abdominal or back pain.   Thank you for allowing [place] to participate in this patient's care.  Clemon Chambers, RN, MSN, FNP-C [facility] Office: [facility] Physician: Trula Slade on call  02/21/2019, 8:32 AM

## 2019-02-21 NOTE — Telephone Encounter (Signed)
same

## 2019-02-21 NOTE — Telephone Encounter (Signed)
Last labs 02/2018: Lipid, Liver, Met 7 and PSA

## 2019-02-21 NOTE — Telephone Encounter (Addendum)
Blood work ordered in Epic. Patient notified. 

## 2019-02-25 LAB — LIPID PANEL
Chol/HDL Ratio: 5.6 ratio — ABNORMAL HIGH (ref 0.0–5.0)
Cholesterol, Total: 234 mg/dL — ABNORMAL HIGH (ref 100–199)
HDL: 42 mg/dL (ref >39)
LDL Calculated: 157 mg/dL — ABNORMAL HIGH (ref 0–99)
Triglycerides: 175 mg/dL — ABNORMAL HIGH (ref 0–149)
VLDL Cholesterol Cal: 35 mg/dL (ref 5–40)

## 2019-02-25 LAB — BASIC METABOLIC PANEL
BUN/Creatinine Ratio: 14 (ref 10–24)
BUN: 15 mg/dL (ref 8–27)
CO2: 22 mmol/L (ref 20–29)
Calcium: 9.5 mg/dL (ref 8.6–10.2)
Chloride: 103 mmol/L (ref 96–106)
Creatinine, Ser: 1.06 mg/dL (ref 0.76–1.27)
GFR calc Af Amer: 87 mL/min/{1.73_m2} (ref >59)
GFR calc non Af Amer: 75 mL/min/{1.73_m2} (ref >59)
Glucose: 103 mg/dL — ABNORMAL HIGH (ref 65–99)
Potassium: 5.1 mmol/L (ref 3.5–5.2)
Sodium: 138 mmol/L (ref 134–144)

## 2019-02-25 LAB — HEPATIC FUNCTION PANEL
ALT: 24 IU/L (ref 0–44)
AST: 22 IU/L (ref 0–40)
Albumin: 4.5 g/dL (ref 3.8–4.8)
Alkaline Phosphatase: 68 IU/L (ref 39–117)
Bilirubin Total: 0.4 mg/dL (ref 0.0–1.2)
Bilirubin, Direct: 0.1 mg/dL (ref 0.00–0.40)
Total Protein: 7 g/dL (ref 6.0–8.5)

## 2019-02-25 LAB — PSA: Prostate Specific Ag, Serum: 0.8 ng/mL (ref 0.0–4.0)

## 2019-02-26 ENCOUNTER — Other Ambulatory Visit: Payer: Self-pay | Admitting: Family Medicine

## 2019-02-26 ENCOUNTER — Ambulatory Visit (INDEPENDENT_AMBULATORY_CARE_PROVIDER_SITE_OTHER): Payer: 59 | Admitting: Family Medicine

## 2019-02-26 ENCOUNTER — Other Ambulatory Visit: Payer: Self-pay

## 2019-02-26 ENCOUNTER — Encounter: Payer: Self-pay | Admitting: Family Medicine

## 2019-02-26 VITALS — BP 132/86 | Temp 98.0°F | Ht 73.0 in | Wt 175.0 lb

## 2019-02-26 DIAGNOSIS — Z0000 Encounter for general adult medical examination without abnormal findings: Secondary | ICD-10-CM | POA: Diagnosis not present

## 2019-02-26 DIAGNOSIS — I714 Abdominal aortic aneurysm, without rupture, unspecified: Secondary | ICD-10-CM

## 2019-02-26 DIAGNOSIS — E785 Hyperlipidemia, unspecified: Secondary | ICD-10-CM | POA: Diagnosis not present

## 2019-02-26 DIAGNOSIS — I10 Essential (primary) hypertension: Secondary | ICD-10-CM | POA: Diagnosis not present

## 2019-02-26 MED ORDER — CLOBETASOL PROPIONATE 0.05 % EX CREA: TOPICAL_CREAM | CUTANEOUS | 1 refills | Status: DC

## 2019-02-26 NOTE — Progress Notes (Signed)
Subjective:    Patient ID: Billy Mccormick, male    DOB: Aug 06, 1955, 63 y.o.   MRN: [id number]  HPI The patient comes in today for a wellness visit.    A review of their health history was completed.  A review of medications was also completed.  Any needed refills; update refills  Eating habits: health conscious sometimes  Falls/  MVA accidents in past few months: none  Regular exercise: stays active  Specialist pt sees on regular basis: none  Preventative health issues were discussed.   Additional concerns: none  Blood pressure medicine and blood pressure levels reviewed today with patient. Compliant with blood pressure medicine. States does not miss a dose. No obvious side effects. Blood pressure generally good when checked elsewhere. Watching salt intake.  Results for orders placed or performed in visit on 33/29/51  Basic metabolic panel  Result Value Ref Range   Glucose 103 (H) 65 - 99 mg/dL   BUN 15 8 - 27 mg/dL   Creatinine, Ser 1.06 0.76 - 1.27 mg/dL   GFR calc non Af Amer 75 >59 mL/min/1.73   GFR calc Af Amer 87 >59 mL/min/1.73   BUN/Creatinine Ratio 14 10 - 24   Sodium 138 134 - 144 mmol/L   Potassium 5.1 3.5 - 5.2 mmol/L   Chloride 103 96 - 106 mmol/L   CO2 22 20 - 29 mmol/L   Calcium 9.5 8.6 - 10.2 mg/dL  Lipid panel  Result Value Ref Range   Cholesterol, Total 234 (H) 100 - 199 mg/dL   Triglycerides 175 (H) 0 - 149 mg/dL   HDL 42 >39 mg/dL   VLDL Cholesterol Cal 35 5 - 40 mg/dL   LDL Calculated 157 (H) 0 - 99 mg/dL   Chol/HDL Ratio 5.6 (H) 0.0 - 5.0 ratio  Hepatic function panel  Result Value Ref Range   Total Protein 7.0 6.0 - 8.5 g/dL   Albumin 4.5 3.8 - 4.8 g/dL   Bilirubin Total 0.4 0.0 - 1.2 mg/dL   Bilirubin, Direct 0.10 0.00 - 0.40 mg/dL   Alkaline Phosphatase 68 39 - 117 IU/L   AST 22 0 - 40 IU/L   ALT 24 0 - 44 IU/L  PSA  Result Value Ref Range   Prostate Specific Ag, Serum 0.8 0.0 - 4.0 ng/mL     Review of Systems   Constitutional: Negative for activity change, appetite change and fever.  HENT: Negative for congestion and rhinorrhea.   Eyes: Negative for discharge.  Respiratory: Negative for cough and wheezing.   Cardiovascular: Negative for chest pain.  Gastrointestinal: Negative for abdominal pain, blood in stool and vomiting.  Genitourinary: Negative for difficulty urinating and frequency.  Musculoskeletal: Negative for neck pain.  Skin: Negative for rash.  Allergic/Immunologic: Negative for environmental allergies and food allergies.  Neurological: Negative for weakness and headaches.  Psychiatric/Behavioral: Negative for agitation.  All other systems reviewed and are negative.      Objective:   Physical Exam Vitals signs reviewed.  Constitutional:      Appearance: He is well-developed.  HENT:     Head: Normocephalic and atraumatic.     Right Ear: External ear normal.     Left Ear: External ear normal.     Nose: Nose normal.  Eyes:     Pupils: Pupils are equal, round, and reactive to light.  Neck:     Musculoskeletal: Normal range of motion and neck supple.     Thyroid: No thyromegaly.  Cardiovascular:  Rate and Rhythm: Normal rate and regular rhythm.     Heart sounds: Normal heart sounds. No murmur.  Pulmonary:     Effort: Pulmonary effort is normal. No respiratory distress.     Breath sounds: Normal breath sounds. No wheezing.  Abdominal:     General: Bowel sounds are normal. There is no distension.     Palpations: Abdomen is soft. There is no mass.     Tenderness: There is no abdominal tenderness.  Genitourinary:    Penis: Normal.   Musculoskeletal: Normal range of motion.  Lymphadenopathy:     Cervical: No cervical adenopathy.  Skin:    General: Skin is warm and dry.     Findings: No erythema.  Neurological:     Mental Status: He is alert.     Motor: No abnormal muscle tone.  Psychiatric:        Behavior: Behavior normal.        Judgment: Judgment normal.            Assessment & Plan:  1.wellness exam.  Diet discussed.  Exercise discussed.  Colonoscopy done.  Vaccines discussed and encouraged.  Encouraged to stop smoking  2 htn good control.  Discussed.  Compliance discussed.  Diet and exercise discussed in this regard  3.  Hyperlipidemia.  Strongly encouraged patient to consider getting on statin medicine.  Due to problem #4.  Discussed potential side effects benefits.  He still declined.  4.  Abdominal aortic aneurysm  Pt neds temovate for psoriasis , tube

## 2019-03-01 ENCOUNTER — Encounter: Payer: Self-pay | Admitting: Family Medicine

## 2019-06-03 ENCOUNTER — Other Ambulatory Visit: Payer: Self-pay | Admitting: Family Medicine

## 2019-08-02 ENCOUNTER — Other Ambulatory Visit: Payer: Self-pay | Admitting: Family Medicine

## 2019-08-21 ENCOUNTER — Encounter: Payer: Self-pay | Admitting: Family Medicine

## 2019-08-25 ENCOUNTER — Other Ambulatory Visit (HOSPITAL_COMMUNITY): Payer: 59

## 2019-08-25 ENCOUNTER — Ambulatory Visit: Payer: 59 | Admitting: Family

## 2019-08-29 ENCOUNTER — Other Ambulatory Visit: Payer: Self-pay

## 2019-08-29 ENCOUNTER — Ambulatory Visit (INDEPENDENT_AMBULATORY_CARE_PROVIDER_SITE_OTHER): Payer: 59 | Admitting: Family Medicine

## 2019-08-29 ENCOUNTER — Encounter: Payer: Self-pay | Admitting: Family Medicine

## 2019-08-29 VITALS — BP 138/88 | Temp 96.1°F | Wt 181.8 lb

## 2019-08-29 DIAGNOSIS — I10 Essential (primary) hypertension: Secondary | ICD-10-CM

## 2019-08-29 MED ORDER — ENALAPRIL MALEATE 10 MG PO TABS: ORAL_TABLET | ORAL | 2 refills | Status: DC

## 2019-08-29 NOTE — Progress Notes (Signed)
   Subjective:    Patient ID: Billy Mccormick, male    DOB: 08/09/1955, 64 y.o.   MRN: [id number]  Hypertension This is a new problem. There are no compliance problems.   pt here for 6 month follow up. Pt states he takes meds daily as prescribed. Does not check blood pressure regular.   Blood pressure medicine and blood pressure levels reviewed today with patient. Compliant with blood pressure medicine. States does not miss a dose. No obvious side effects. Blood pressure generally good when checked elsewhere. Watching salt intake.    Review of Systems No headache, no major weight loss or weight gain, no chest pain no back pain abdominal pain no change in bowel habits complete ROS otherwise negative     Objective:   Physical Exam  Alert vitals stable, NAD. Blood pressure not good on repeat.  In fact numbers elevated.  HEENT normal. Lungs clear. Heart regular rate and rhythm.       Assessment & Plan:  Impression hypertension suboptimal control discussed increase enalapril to 10 mg daily..  Patient does have an aortic aneurysm.  He also has hyperlipidemia and declines treatment.  He also unfortunately still smokes.  Discussed patient presents with recent medication follow-up 6 months

## 2019-09-15 ENCOUNTER — Telehealth (HOSPITAL_COMMUNITY): Payer: Self-pay

## 2019-09-15 NOTE — Telephone Encounter (Signed)

## 2019-09-16 ENCOUNTER — Other Ambulatory Visit: Payer: Self-pay | Admitting: *Deleted

## 2019-09-16 DIAGNOSIS — I714 Abdominal aortic aneurysm, without rupture, unspecified: Secondary | ICD-10-CM

## 2019-09-17 ENCOUNTER — Other Ambulatory Visit: Payer: Self-pay

## 2019-09-17 ENCOUNTER — Ambulatory Visit (HOSPITAL_COMMUNITY)
Admission: RE | Admit: 2019-09-17 | Discharge: 2019-09-17 | Disposition: A | Discharge status: Home / Self Care | Payer: 59 | Source: Ambulatory Visit | Attending: Vascular Surgery | Admitting: Vascular Surgery

## 2019-09-17 ENCOUNTER — Ambulatory Visit: Payer: 59 | Admitting: Physician Assistant

## 2019-09-17 VITALS — BP 148/100 | HR 96 | Temp 97.0°F | Resp 16 | Ht 73.0 in | Wt 176.0 lb

## 2019-09-17 DIAGNOSIS — I714 Abdominal aortic aneurysm, without rupture, unspecified: Secondary | ICD-10-CM

## 2019-09-17 NOTE — Progress Notes (Signed)
Reason for f/u asymptomatic abdominal aortic aneurysm  History of Present Illness:  Patient is a 64 y.o. year old male who presents for evaluation of abdominal aortic aneurysm.   This was discovered incidentally on lumbar back MRI in 2016.    The aneurysm is currently 4.85 cm in diameter by US/CT performed at here on 02/21/2019.  The patient denies abdominal pain.  The patient denies back pain and no symptoms of claudication in B LE.  Other medical problems include HTN and hyperlipidemia.  Discussed with PCP hyperlipidemia and declines treatment.   Past Medical History:  Diagnosis Date  . Degenerative cervical disc   . Essential hypertension 10/02/2015  . Hyperlipidemia   . IFG (impaired fasting glucose)   . Lumbar herniated disc 2015   L4,L5  . Psoriasis     Past Surgical History:  Procedure Laterality Date  . ACHILLES TENDON REPAIR     right  . COLONOSCOPY  06/25/2006   Dr. Gala Romney: extensive left-sided diverticula, 2 six to seven mm pale-appearing hyperplastic-appearing polypoid lesions vs atypcial appearing lipoma. (bx-hyperplastic polyps). next TCS 06/2016  . COLONOSCOPY N/A 06/21/2017   Procedure: COLONOSCOPY;  Surgeon: Daneil Dolin, MD;  [facility];  Service: Endoscopy;  Laterality: N/A;  9:45 AM  . ESOPHAGOGASTRODUODENOSCOPY N/A 11/28/2013   [id number] Schatzki's ring  not manipulated. Small hiatal hernia. Gastric ulcer and erosions as above. Bx antrum-extensive intestinal metaplasia, no H.pylori  . ESOPHAGOGASTRODUODENOSCOPY N/A 05/26/2014   Procedure: ESOPHAGOGASTRODUODENOSCOPY (EGD);  Surgeon: Daneil Dolin, MD;  [facility];  Service: Endoscopy;  Laterality: N/A;  7:30AM  . HERNIA REPAIR     x 2  . MALONEY DILATION N/A 11/28/2013   Procedure: Venia Minks DILATION;  Surgeon: Daneil Dolin, MD;  [facility];  Service: Endoscopy;  Laterality: N/A;  . NOSE SURGERY     x 2  . POLYPECTOMY  06/21/2017   Procedure: POLYPECTOMY;   Surgeon: Daneil Dolin, MD;  [facility];  Service: Endoscopy;;  colon  . SAVORY DILATION N/A 11/28/2013   Procedure: SAVORY DILATION;  Surgeon: Daneil Dolin, MD;  [facility];  Service: Endoscopy;  Laterality: N/A;     Social History Social History   Tobacco Use  . Smoking status: Current Every Day Smoker    Packs/day: 1.00    Years: 35.00    Pack years: 35.00    Types: Cigarettes  . Smokeless tobacco: Never Used  . Tobacco comment: Currently down to 1/2 pack daily   Substance Use Topics  . Alcohol use: Yes    Alcohol/week: 10.0 standard drinks    Types: 7 Cans of beer, 3 Shots of liquor per week    Comment: 6 beers about 3 days weekly  . Drug use: No    Family History Family History  Problem Relation Age of Onset  . Hyperlipidemia Mother   . Hyperlipidemia Father   . Kidney disease Brother   . Colon cancer Neg Hx     Allergies  Allergies  Allergen Reactions  . Prednisone     Side effects     Current Outpatient Medications  Medication Sig Dispense Refill  . clobetasol cream (TEMOVATE) 0.05 % Apply bid to affected area 30 g 1  . enalapril (VASOTEC) 10 MG tablet TAKE 1 TABLET(5 MG) BY MOUTH DAILY 90 tablet 2  . pantoprazole (PROTONIX) 40 MG tablet TAKE 1 TABLET(40 MG) BY MOUTH DAILY 90 tablet 1  No current facility-administered medications for this visit.    ROS:   General:  No weight loss, Fever, chills  HEENT: No recent headaches, no nasal bleeding, no visual changes, no sore throat  Neurologic: No dizziness, blackouts, seizures. No recent symptoms of stroke or mini- stroke. No recent episodes of slurred speech, or temporary blindness.  Cardiac: No recent episodes of chest pain/pressure, no shortness of breath at rest.  No shortness of breath with exertion.  Denies history of atrial fibrillation or irregular heartbeat  Vascular: No history of rest pain in feet.  No history of claudication.  No history of non-healing ulcer, No  history of DVT   Pulmonary: No home oxygen, no productive cough, no hemoptysis,  No asthma or wheezing  Musculoskeletal:  [ ]  Arthritis, [ ]  Low back pain,  [x ] Joint pain  Hematologic:No history of hypercoagulable state.  No history of easy bleeding.  No history of anemia  Gastrointestinal: No hematochezia or melena,  No gastroesophageal reflux, no trouble swallowing  Urinary: [ ]  chronic Kidney disease, [ ]  on HD - [ ]  MWF or [ ]  TTHS, [ ]  Burning with urination, [ ]  Frequent urination, [ ]  Difficulty urinating;   Skin: No rashes  Psychological: No history of anxiety,  No history of depression   Physical Examination  Vitals:   09/17/19 0848 09/17/19 0852  BP: (!) 172/98 (!) 148/100  Pulse: 96   Resp: 16   Temp: (!) 97 F (36.1 C)   TempSrc: Temporal   SpO2: 91%   Weight: 176 lb (79.8 kg)   Height: 6\' 1"  (1.854 m)     Body mass index is 23.22 kg/m.  General:  Alert and oriented, no acute distress HEENT: Normal Neck: No bruit or JVD Pulmonary: Clear to auscultation bilaterally Cardiac: Regular Rate and Rhythm without murmur Gastrointestinal: Soft, non-tender, non-distended, no mass, no scars, palpable abdominal aortic pulse Skin: No rash Extremity Pulses:  2+ radial, brachial, femoral, dorsalis pedis, posterior tibial pulses bilaterally Musculoskeletal: No deformity or edema  Neurologic: Upper and lower extremity motor 5/5 and symmetric  DATA:  AAA duplex 09/17/19   Abdominal Aorta Findings:  +-----------+-------+----------+----------+---------+--------+--------+  Location  AP (cm)Trans (cm)PSV (cm/s)Waveform ThrombusComments  +-----------+-------+----------+----------+---------+--------+--------+  Proximal  2.70  2.78   81    biphasic           +-----------+-------+----------+----------+---------+--------+--------+  Mid    4.09  3.89   52    biphasic             +-----------+-------+----------+----------+---------+--------+--------+  Distal   4.56  5.00   97    biphasic Present       +-----------+-------+----------+----------+---------+--------+--------+  RT CIA Prox1.0  1.3    221    triphasic          +-----------+-------+----------+----------+---------+--------+--------+  LT CIA Prox1.1  1.1    186    biphasic           +-----------+-------+----------+----------+---------+--------+--------+          Summary:  Abdominal Aorta: The largest aortic diameter remains essentially unchanged  compared to prior exam. Previous diameter measurement was 4.7 cm obtained  on 02/21/2019.   ASSESSMENT:  Asymptomatic AAA stable without change in diameter of 4.7 cm over the last 6 months.   PLAN: He will f/u in 6 months for a repeat AAA duplex.  We reviewed signs and symptoms of AAA rupture and claudication.  If he has problems or concerns he will call.    Discussed with PCP hyperlipidemia  and declines treatment.  Elevated uncontrolled HTN with recent increase in antihypertensive medication enalapril.    Roxy Horseman PA-C [facility] [place] [facility] Office: [phone]  MD in office [place]

## 2019-09-18 ENCOUNTER — Other Ambulatory Visit: Payer: Self-pay | Admitting: *Deleted

## 2019-09-18 DIAGNOSIS — I714 Abdominal aortic aneurysm, without rupture, unspecified: Secondary | ICD-10-CM

## 2019-11-09 ENCOUNTER — Other Ambulatory Visit: Payer: Self-pay | Admitting: Family Medicine

## 2020-01-05 ENCOUNTER — Other Ambulatory Visit: Payer: Self-pay

## 2020-01-05 ENCOUNTER — Ambulatory Visit (HOSPITAL_COMMUNITY)
Admission: RE | Admit: 2020-01-05 | Discharge: 2020-01-05 | Disposition: A | Discharge status: Home / Self Care | Payer: 59 | Source: Ambulatory Visit | Attending: Acute Care | Admitting: Acute Care

## 2020-01-05 DIAGNOSIS — F17210 Nicotine dependence, cigarettes, uncomplicated: Secondary | ICD-10-CM

## 2020-01-05 DIAGNOSIS — F17200 Nicotine dependence, unspecified, uncomplicated: Secondary | ICD-10-CM | POA: Insufficient documentation

## 2020-01-05 DIAGNOSIS — Z122 Encounter for screening for malignant neoplasm of respiratory organs: Secondary | ICD-10-CM | POA: Insufficient documentation

## 2020-01-08 NOTE — Progress Notes (Signed)
Please call patient and let them  know their  low dose Ct was read as a Lung RADS 2: nodules that are benign in appearance and behavior with a very low likelihood of becoming a clinically active cancer due to size or lack of growth. Recommendation per radiology is for a repeat LDCT in 12 months. .Please let them  know we will order and schedule their  annual screening scan for 12/2020 Please let them  know there was notation of CAD on their  scan.  Please remind the patient  that this is a non-gated exam therefore degree or severity of disease  cannot be determined. Please have them  follow up with their PCP regarding potential risk factor modification, dietary therapy or pharmacologic therapy if clinically indicated. Pt.  is  not currently on statin therapy. Please place order for annual  screening scan for  12/2020 and fax results to PCP. Thanks so much. 

## 2020-01-09 ENCOUNTER — Telehealth: Payer: Self-pay | Admitting: Acute Care

## 2020-01-09 DIAGNOSIS — F17210 Nicotine dependence, cigarettes, uncomplicated: Secondary | ICD-10-CM

## 2020-01-09 DIAGNOSIS — Z87891 Personal history of nicotine dependence: Secondary | ICD-10-CM

## 2020-01-12 NOTE — Telephone Encounter (Signed)
Pt informed of CT results per Sarah Groce, NP.  PT verbalized understanding.  Copy sent to PCP.  Order placed for 1 yr f/u CT.  

## 2020-02-16 ENCOUNTER — Telehealth: Payer: Self-pay | Admitting: Family Medicine

## 2020-02-16 DIAGNOSIS — Z125 Encounter for screening for malignant neoplasm of prostate: Secondary | ICD-10-CM

## 2020-02-16 DIAGNOSIS — E785 Hyperlipidemia, unspecified: Secondary | ICD-10-CM

## 2020-02-16 DIAGNOSIS — I10 Essential (primary) hypertension: Secondary | ICD-10-CM

## 2020-02-16 DIAGNOSIS — Z79899 Other long term (current) drug therapy: Secondary | ICD-10-CM

## 2020-02-16 NOTE — Telephone Encounter (Signed)
Pt has cpe 9/7 and would like lab work done.

## 2020-02-16 NOTE — Telephone Encounter (Signed)
Last labs 02/24/2019: PSA, Liver, Lipid, Met 7

## 2020-02-20 NOTE — Telephone Encounter (Signed)
Yes, pls order those and add cbc. Thx. Dr. Lovena Le

## 2020-02-20 NOTE — Telephone Encounter (Signed)
Lab orders placed and pt wife Santa Genera ) has been advised.

## 2020-03-17 ENCOUNTER — Other Ambulatory Visit: Payer: Self-pay

## 2020-03-17 ENCOUNTER — Ambulatory Visit (HOSPITAL_COMMUNITY)
Admission: RE | Admit: 2020-03-17 | Discharge: 2020-03-17 | Disposition: A | Discharge status: Home / Self Care | Payer: 59 | Source: Ambulatory Visit | Attending: Vascular Surgery | Admitting: Vascular Surgery

## 2020-03-17 ENCOUNTER — Ambulatory Visit: Payer: 59 | Admitting: Physician Assistant

## 2020-03-17 VITALS — BP 138/90 | HR 63 | Temp 98.1°F | Resp 20 | Ht 73.0 in | Wt 173.5 lb

## 2020-03-17 DIAGNOSIS — I714 Abdominal aortic aneurysm, without rupture, unspecified: Secondary | ICD-10-CM

## 2020-03-17 DIAGNOSIS — F17200 Nicotine dependence, unspecified, uncomplicated: Secondary | ICD-10-CM

## 2020-03-17 NOTE — Progress Notes (Signed)
HISTORY AND PHYSICAL     CC:  follow up. Requesting Provider:  Erven Colla, DO  HPI: This is a 64 y.o. male who is here today for follow up for AAA and was found incidentally on MRI in 2016.  He was last seen 09/17/2019 and was doing well.  His AAA measured 5.0cm at his last visit and he was asymptomatic.    The pt returns today for follow up.  He states he has been doing well without any issues.  He specifically denies any severe sudden onset of abdominal or back pain.  He is not on a statin due to pt choice.  He is not on as aspirin.   The pt is not on a statin for cholesterol management.    The pt is not on an aspirin.    Other AC:  none The pt is on ACEI for hypertension.  The pt does not have diabetes. Tobacco hx:  current .  Past Medical History:  Diagnosis Date  . Degenerative cervical disc   . Essential hypertension 10/02/2015  . Hyperlipidemia   . IFG (impaired fasting glucose)   . Lumbar herniated disc 2015   L4,L5  . Psoriasis     Past Surgical History:  Procedure Laterality Date  . ACHILLES TENDON REPAIR     right  . COLONOSCOPY  06/25/2006   Dr. Gala Romney: extensive left-sided diverticula, 2 six to seven mm pale-appearing hyperplastic-appearing polypoid lesions vs atypcial appearing lipoma. (bx-hyperplastic polyps). next TCS 06/2016  . COLONOSCOPY N/A 06/21/2017   Procedure: COLONOSCOPY;  Surgeon: Daneil Dolin, MD;  [facility];  Service: Endoscopy;  Laterality: N/A;  9:45 AM  . ESOPHAGOGASTRODUODENOSCOPY N/A 11/28/2013   [id number] Schatzki's ring  not manipulated. Small hiatal hernia. Gastric ulcer and erosions as above. Bx antrum-extensive intestinal metaplasia, no H.pylori  . ESOPHAGOGASTRODUODENOSCOPY N/A 05/26/2014   Procedure: ESOPHAGOGASTRODUODENOSCOPY (EGD);  Surgeon: Daneil Dolin, MD;  [facility];  Service: Endoscopy;  Laterality: N/A;  7:30AM  . HERNIA REPAIR     x 2  . MALONEY DILATION N/A 11/28/2013   Procedure:  Venia Minks DILATION;  Surgeon: Daneil Dolin, MD;  [facility];  Service: Endoscopy;  Laterality: N/A;  . NOSE SURGERY     x 2  . POLYPECTOMY  06/21/2017   Procedure: POLYPECTOMY;  Surgeon: Daneil Dolin, MD;  [facility];  Service: Endoscopy;;  colon  . SAVORY DILATION N/A 11/28/2013   Procedure: SAVORY DILATION;  Surgeon: Daneil Dolin, MD;  [facility];  Service: Endoscopy;  Laterality: N/A;    Allergies  Allergen Reactions  . Prednisone     Side effects    Current Outpatient Medications  Medication Sig Dispense Refill  . clobetasol cream (TEMOVATE) 0.05 % Apply bid to affected area 30 g 1  . enalapril (VASOTEC) 10 MG tablet TAKE 1 TABLET(5 MG) BY MOUTH DAILY 90 tablet 2  . pantoprazole (PROTONIX) 40 MG tablet TAKE 1 TABLET(40 MG) BY MOUTH DAILY 90 tablet 1   No current facility-administered medications for this visit.    Family History  Problem Relation Age of Onset  . Hyperlipidemia Mother   . Hyperlipidemia Father   . Kidney disease Brother   . Colon cancer Neg Hx     Social History   Socioeconomic History  . Marital status: Married    Spouse name: Not on file  . Number of children: 0  . Years of education: Not on  file  . Highest education level: Not on file  Occupational History  . Not on file  Tobacco Use  . Smoking status: Current Every Day Smoker    Packs/day: 1.00    Years: 35.00    Pack years: 35.00    Types: Cigarettes  . Smokeless tobacco: Never Used  . Tobacco comment: Currently down to 1/2 pack daily   Vaping Use  . Vaping Use: Never used  Substance and Sexual Activity  . Alcohol use: Yes    Alcohol/week: 10.0 standard drinks    Types: 7 Cans of beer, 3 Shots of liquor per week    Comment: 6 beers about 3 days weekly  . Drug use: No  . Sexual activity: Not on file  Other Topics Concern  . Not on file  Social History Narrative  . Not on file   Social Determinants of Health   Financial Resource Strain:    . Difficulty of Paying Living Expenses: Not on file  Food Insecurity:   . Worried About Charity fundraiser in the Last Year: Not on file  . Ran Out of Food in the Last Year: Not on file  Transportation Needs:   . Lack of Transportation (Medical): Not on file  . Lack of Transportation (Non-Medical): Not on file  Physical Activity:   . Days of Exercise per Week: Not on file  . Minutes of Exercise per Session: Not on file  Stress:   . Feeling of Stress : Not on file  Social Connections:   . Frequency of Communication with Friends and Family: Not on file  . Frequency of Social Gatherings with Friends and Family: Not on file  . Attends Religious Services: Not on file  . Active Member of Clubs or Organizations: Not on file  . Attends Archivist Meetings: Not on file  . Marital Status: Not on file  Intimate Partner Violence:   . Fear of Current or Ex-Partner: Not on file  . Emotionally Abused: Not on file  . Physically Abused: Not on file  . Sexually Abused: Not on file     REVIEW OF SYSTEMS:   [X]  denotes positive finding, [ ]  denotes negative finding Cardiac  Comments:  Chest pain or chest pressure:    Shortness of breath upon exertion:    Short of breath when lying flat:    Irregular heart rhythm:        Vascular    Pain in calf, thigh, or hip brought on by ambulation:    Pain in feet at night that wakes you up from your sleep:     Blood clot in your veins:    Leg swelling:         Pulmonary    Oxygen at home:    Productive cough:     Wheezing:         Neurologic    Sudden weakness in arms or legs:     Sudden numbness in arms or legs:     Sudden onset of difficulty speaking or slurred speech:    Temporary loss of vision in one eye:     Problems with dizziness:         Gastrointestinal    Blood in stool:     Vomited blood:         Genitourinary    Burning when urinating:     Blood in urine:        Psychiatric    Major depression:  Hematologic    Bleeding problems:    Problems with blood clotting too easily:        Skin    Rashes or ulcers:        Constitutional    Fever or chills:      PHYSICAL EXAMINATION:  Today's Vitals   03/17/20 0939  BP: 138/90  Pulse: 63  Resp: 20  Temp: 98.1 F (36.7 C)  TempSrc: Temporal  SpO2: 97%  Weight: 173 lb 8 oz (78.7 kg)  Height: 6\' 1"  (1.854 m)  PainSc: 0-No pain   Body mass index is 22.89 kg/m.   General:  WDWN in NAD; vital signs documented above Gait: Normal HENT: WNL, normocephalic Pulmonary: normal non-labored breathing , without wheezing Cardiac: regular HR, without  Murmur; without carotid bruits Abdomen: soft, NT, no masses; abdominal aortic pulse is palpable Skin: without rashes Vascular Exam/Pulses:  Right Left  Radial 2+ (normal) 2+ (normal)  Ulnar Unable to palpate Unable to palpate  Popliteal Unable to palpate Unable to palpate  DP Unable to palpate Unable to palpate  PT 2+ (normal) 2+ (normal)   Extremities: without ischemic changes, without Gangrene , without cellulitis; without open wounds;  Musculoskeletal: no muscle wasting or atrophy  Neurologic: A&O X 3;  No focal weakness or paresthesias are detected Psychiatric:  The pt has Normal affect.   Non-Invasive Vascular Imaging:   AAA on 03/17/2020 Abdominal Aorta Findings:  +-----------+-------+----------+----------+--------+--------+--------+  Location  AP (cm)Trans (cm)PSV (cm/s)WaveformThrombusComments  +-----------+-------+----------+----------+--------+--------+--------+  Proximal  2.10  2.41   61    biphasic          +-----------+-------+----------+----------+--------+--------+--------+  Mid    4.21  4.05   43    biphasicPresent       +-----------+-------+----------+----------+--------+--------+--------+  Distal   5.11  5.29   153    biphasicPresent        +-----------+-------+----------+----------+--------+--------+--------+  RT CIA Prox1.1  1.0    171    biphasic          +-----------+-------+----------+----------+--------+--------+--------+  LT CIA Prox1.1  1.2    104    biphasic          +-----------+-------+----------+----------+--------+--------+--------+   Previous AAA 09/17/2019: Abdominal Aorta Findings:  +-----------+-------+----------+----------+---------+--------+--------+  Location  AP (cm)Trans (cm)PSV (cm/s)Waveform ThrombusComments  +-----------+-------+----------+----------+---------+--------+--------+  Proximal  2.70  2.78   81    biphasic           +-----------+-------+----------+----------+---------+--------+--------+  Mid    4.09  3.89   52    biphasic           +-----------+-------+----------+----------+---------+--------+--------+  Distal   4.56  5.00   97    biphasic Present       +-----------+-------+----------+----------+---------+--------+--------+  RT CIA Prox1.0  1.3    221    triphasic          +-----------+-------+----------+----------+---------+--------+--------+  LT CIA Prox1.1  1.1    186    biphasic           +-----------+-------+----------+----------+---------+--------+--------+    ASSESSMENT/PLAN:: 64 y.o. male here for follow up for AAA  AAA -pt's AAA has increased in size from 5.0cm to 5.3 in 6 months.  He is asymptomatic and has not had any abdominal or back pain.  I discussed with Dr. Oneida Alar and will schedule pt for CTA abdomen and pelvis in the next 1-2 weeks and he will f/u with Dr. Donnetta Hutching in [place] after his CTA.  He is having las drawn for his yearly physical in the next day or two.  I did discuss with pt that if he develops sudden onset of back or abdominal pain, he should get to ER immediately as a ruptured AAA is  life threatening.  He expressed understanding.  -I discussed with pt that he would benefit from statin and has not taken this in the past due to preference.  He would like to discuss this with his PCP.    -Also discussed with pt that he needs excellent blood pressure control due to his AAA.  -discussed with pt to start baby aspirin daily.  Current smoker -discussed with pt the importance of smoking cessation.   -pt will f/u after his CTA with Dr. Donnetta Hutching in [place].   Leontine Locket, [facility] [phone]  Clinic MD:   Discussed with Dr. Oneida Alar

## 2020-03-18 ENCOUNTER — Ambulatory Visit (HOSPITAL_COMMUNITY)
Admission: RE | Admit: 2020-03-18 | Discharge: 2020-03-18 | Disposition: A | Discharge status: Home / Self Care | Payer: 59 | Source: Ambulatory Visit | Attending: Vascular Surgery | Admitting: Vascular Surgery

## 2020-03-18 DIAGNOSIS — I714 Abdominal aortic aneurysm, without rupture, unspecified: Secondary | ICD-10-CM

## 2020-03-18 LAB — CBC WITH DIFFERENTIAL/PLATELET
Basophils Absolute: 0.1 10*3/uL (ref 0.0–0.2)
Basos: 1 %
EOS (ABSOLUTE): 0.2 10*3/uL (ref 0.0–0.4)
Eos: 2 %
Hematocrit: 45.8 % (ref 37.5–51.0)
Hemoglobin: 16 g/dL (ref 13.0–17.7)
Immature Grans (Abs): 0 10*3/uL (ref 0.0–0.1)
Immature Granulocytes: 0 %
Lymphocytes Absolute: 2.4 10*3/uL (ref 0.7–3.1)
Lymphs: 30 %
MCH: 30.7 pg (ref 26.6–33.0)
MCHC: 34.9 g/dL (ref 31.5–35.7)
MCV: 88 fL (ref 79–97)
Monocytes Absolute: 0.6 10*3/uL (ref 0.1–0.9)
Monocytes: 8 %
Neutrophils Absolute: 4.7 10*3/uL (ref 1.4–7.0)
Neutrophils: 59 %
Platelets: 267 10*3/uL (ref 150–450)
RBC: 5.22 x10E6/uL (ref 4.14–5.80)
RDW: 13 % (ref 11.6–15.4)
WBC: 7.9 10*3/uL (ref 3.4–10.8)

## 2020-03-18 LAB — LIPID PANEL
Chol/HDL Ratio: 4.8 ratio (ref 0.0–5.0)
Cholesterol, Total: 219 mg/dL — ABNORMAL HIGH (ref 100–199)
HDL: 46 mg/dL (ref >39)
LDL Chol Calc (NIH): 152 mg/dL — ABNORMAL HIGH (ref 0–99)
Triglycerides: 115 mg/dL (ref 0–149)
VLDL Cholesterol Cal: 21 mg/dL (ref 5–40)

## 2020-03-18 LAB — BASIC METABOLIC PANEL
BUN/Creatinine Ratio: 10 (ref 10–24)
BUN: 10 mg/dL (ref 8–27)
CO2: 23 mmol/L (ref 20–29)
Calcium: 9.8 mg/dL (ref 8.6–10.2)
Chloride: 103 mmol/L (ref 96–106)
Creatinine, Ser: 0.97 mg/dL (ref 0.76–1.27)
GFR calc Af Amer: 96 mL/min/{1.73_m2} (ref >59)
GFR calc non Af Amer: 83 mL/min/{1.73_m2} (ref >59)
Glucose: 99 mg/dL (ref 65–99)
Potassium: 5.4 mmol/L — ABNORMAL HIGH (ref 3.5–5.2)
Sodium: 140 mmol/L (ref 134–144)

## 2020-03-18 LAB — HEPATIC FUNCTION PANEL
ALT: 17 IU/L (ref 0–44)
AST: 22 IU/L (ref 0–40)
Albumin: 4.4 g/dL (ref 3.8–4.8)
Alkaline Phosphatase: 80 IU/L (ref 48–121)
Bilirubin Total: 0.5 mg/dL (ref 0.0–1.2)
Bilirubin, Direct: 0.13 mg/dL (ref 0.00–0.40)
Total Protein: 7.2 g/dL (ref 6.0–8.5)

## 2020-03-18 LAB — PSA: Prostate Specific Ag, Serum: 0.9 ng/mL (ref 0.0–4.0)

## 2020-03-18 MED ORDER — IOHEXOL 350 MG/ML SOLN
100.0000 mL | Freq: Once | INTRAVENOUS | Status: AC | PRN
  Administered 2020-03-18: 100 mL via INTRAVENOUS

## 2020-03-23 ENCOUNTER — Encounter: Payer: 59 | Admitting: Family Medicine

## 2020-03-29 ENCOUNTER — Other Ambulatory Visit: Payer: Self-pay

## 2020-03-29 ENCOUNTER — Encounter: Payer: Self-pay | Admitting: Vascular Surgery

## 2020-03-29 ENCOUNTER — Ambulatory Visit: Payer: 59 | Admitting: Vascular Surgery

## 2020-03-29 VITALS — BP 154/101 | HR 80 | Temp 97.9°F | Resp 14 | Ht 73.5 in | Wt 175.0 lb

## 2020-03-29 DIAGNOSIS — I714 Abdominal aortic aneurysm, without rupture, unspecified: Secondary | ICD-10-CM

## 2020-03-29 NOTE — Progress Notes (Signed)
[facility] of [place]  Patient name: Billy Mccormick MRN: [id number] DOB: 1956/01/07 Sex: male  REASON FOR VISIT: Discuss recent CT scan for evaluation of enlarging abdominal aortic aneurysm  HPI: DAMEER SPEISER II is a 64 y.o. male here today for follow-up.  Was initially seen by myself in January 2016 with ectasia of his infrarenal aorta.  At that time ultrasound revealed between 3 and 3-1/2 cm enlargement of his aorta.  He has been followed in our office with serial exams with continued growth throughout this time.  Most recently he had a CT showing regression to nearly 5-1/2 cm and he is undergoing outpatient CT scan and is here today for further discussion of this.  He is in excellent health.  He has no history of cardiac disease.  He has no history of peripheral vascular occlusive disease or stroke.  He has no claudication symptoms.  Past Medical History:  Diagnosis Date  . Degenerative cervical disc   . Essential hypertension 10/02/2015  . Hyperlipidemia   . IFG (impaired fasting glucose)   . Lumbar herniated disc 2015   L4,L5  . Psoriasis     Family History  Problem Relation Age of Onset  . Hyperlipidemia Mother   . Hyperlipidemia Father   . Kidney disease Brother   . Colon cancer Neg Hx     SOCIAL HISTORY: Social History   Tobacco Use  . Smoking status: Current Every Day Smoker    Packs/day: 1.00    Years: 35.00    Pack years: 35.00    Types: Cigarettes  . Smokeless tobacco: Never Used  . Tobacco comment: Currently down to 1/2 pack daily   Substance Use Topics  . Alcohol use: Yes    Alcohol/week: 10.0 standard drinks    Types: 7 Cans of beer, 3 Shots of liquor per week    Comment: 6 beers about 3 days weekly    Allergies  Allergen Reactions  . Prednisone     Side effects    Current Outpatient Medications  Medication Sig Dispense Refill  . clobetasol cream (TEMOVATE) 0.05 % Apply bid to affected  area 30 g 1  . enalapril (VASOTEC) 10 MG tablet TAKE 1 TABLET(5 MG) BY MOUTH DAILY 90 tablet 2  . pantoprazole (PROTONIX) 40 MG tablet TAKE 1 TABLET(40 MG) BY MOUTH DAILY 90 tablet 1   No current facility-administered medications for this visit.    REVIEW OF SYSTEMS:  [X]  denotes positive finding, [ ]  denotes negative finding Cardiac  Comments:  Chest pain or chest pressure:    Shortness of breath upon exertion:    Short of breath when lying flat:    Irregular heart rhythm:        Vascular    Pain in calf, thigh, or hip brought on by ambulation:    Pain in feet at night that wakes you up from your sleep:     Blood clot in your veins:    Leg swelling:           PHYSICAL EXAM: Vitals:   03/29/20 1308  BP: (!) 154/101  Pulse: 80  Resp: 14  Temp: 97.9 F (36.6 C)  TempSrc: Temporal  SpO2: 94%  Weight: 175 lb (79.4 kg)  Height: 6' 1.5" (1.867 m)    GENERAL: The patient is a well-nourished male, in no acute distress. The vital signs are documented above. CARDIOVASCULAR: 2+ radial, 2+ femoral, 2+ popliteal and 2+ dorsalis pedis pulses bilaterally.  Abdominal exam reveals easily palpable aneurysm.  His aortic aneurysm is nontender PULMONARY: There is good air exchange  MUSCULOSKELETAL: There are no major deformities or cyanosis. NEUROLOGIC: No focal weakness or paresthesias are detected. SKIN: There are no ulcers or rashes noted. PSYCHIATRIC: The patient has a normal affect.  DATA:  CT scan shows maximal diameter of 5.4 cm.  This does below began below the level of the renal arteries.  He does have a slight reverse taper but a long infrarenal neck.  He has mild calcification and tortuosity of his iliac vessels bilaterally  MEDICAL ISSUES: I had a very long discussion with the patient regarding options for open versus stent graft repair of abdominal aortic aneurysm.  With his continued growth and now 5.4 cm maximal diameter I would recommend elective repair.  He has no cardiac  history.  We will have him see a cardiologist here in [place] for preoperative clearance.  We will then proceed with repair.    Rosetta Posner, MD FACS [facility] Tel [phone] Pager [phone]

## 2020-03-29 NOTE — H&P (View-Only) (Signed)
Vascular and Vein Specialist of [place]  Patient name: Billy Mccormick MRN: [id number] DOB: Dec 24, 1955 Sex: male  REASON FOR VISIT: Discuss recent CT scan for evaluation of enlarging abdominal aortic aneurysm  HPI: Billy Mccormick is a 64 y.o. male here today for follow-up.  Was initially seen by myself in January 2016 with ectasia of his infrarenal aorta.  At that time ultrasound revealed between 3 and 3-1/2 cm enlargement of his aorta.  He has been followed in our office with serial exams with continued growth throughout this time.  Most recently he had a CT showing regression to nearly 5-1/2 cm and he is undergoing outpatient CT scan and is here today for further discussion of this.  He is in excellent health.  He has no history of cardiac disease.  He has no history of peripheral vascular occlusive disease or stroke.  He has no claudication symptoms.  Past Medical History:  Diagnosis Date  . Degenerative cervical disc   . Essential hypertension 10/02/2015  . Hyperlipidemia   . IFG (impaired fasting glucose)   . Lumbar herniated disc 2015   L4,L5  . Psoriasis     Family History  Problem Relation Age of Onset  . Hyperlipidemia Mother   . Hyperlipidemia Father   . Kidney disease Brother   . Colon cancer Neg Hx     SOCIAL HISTORY: Social History   Tobacco Use  . Smoking status: Current Every Day Smoker    Packs/day: 1.00    Years: 35.00    Pack years: 35.00    Types: Cigarettes  . Smokeless tobacco: Never Used  . Tobacco comment: Currently down to 1/2 pack daily   Substance Use Topics  . Alcohol use: Yes    Alcohol/week: 10.0 standard drinks    Types: 7 Cans of beer, 3 Shots of liquor per week    Comment: 6 beers about 3 days weekly    Allergies  Allergen Reactions  . Prednisone     Side effects    Current Outpatient Medications  Medication Sig Dispense Refill  . clobetasol cream (TEMOVATE) 0.05 % Apply bid to affected  area 30 g 1  . enalapril (VASOTEC) 10 MG tablet TAKE 1 TABLET(5 MG) BY MOUTH DAILY 90 tablet 2  . pantoprazole (PROTONIX) 40 MG tablet TAKE 1 TABLET(40 MG) BY MOUTH DAILY 90 tablet 1   No current facility-administered medications for this visit.    REVIEW OF SYSTEMS:  [X]  denotes positive finding, [ ]  denotes negative finding Cardiac  Comments:  Chest pain or chest pressure:    Shortness of breath upon exertion:    Short of breath when lying flat:    Irregular heart rhythm:        Vascular    Pain in calf, thigh, or hip brought on by ambulation:    Pain in feet at night that wakes you up from your sleep:     Blood clot in your veins:    Leg swelling:           PHYSICAL EXAM: Vitals:   03/29/20 1308  BP: (!) 154/101  Pulse: 80  Resp: 14  Temp: 97.9 F (36.6 C)  TempSrc: Temporal  SpO2: 94%  Weight: 175 lb (79.4 kg)  Height: 6' 1.5" (1.867 m)    GENERAL: The patient is a well-nourished male, in no acute distress. The vital signs are documented above. CARDIOVASCULAR: 2+ radial, 2+ femoral, 2+ popliteal and 2+ dorsalis pedis pulses bilaterally.  Abdominal exam reveals easily palpable aneurysm.  His aortic aneurysm is nontender PULMONARY: There is good air exchange  MUSCULOSKELETAL: There are no major deformities or cyanosis. NEUROLOGIC: No focal weakness or paresthesias are detected. SKIN: There are no ulcers or rashes noted. PSYCHIATRIC: The patient has a normal affect.  DATA:  CT scan shows maximal diameter of 5.4 cm.  This does below began below the level of the renal arteries.  He does have a slight reverse taper but a long infrarenal neck.  He has mild calcification and tortuosity of his iliac vessels bilaterally  MEDICAL ISSUES: I had a very long discussion with the patient regarding options for open versus stent graft repair of abdominal aortic aneurysm.  With his continued growth and now 5.4 cm maximal diameter I would recommend elective repair.  He has no cardiac  history.  We will have him see a cardiologist here in [place] for preoperative clearance.  We will then proceed with repair.    Rosetta Posner, MD FACS [facility] of [facility] Tel [phone] Pager [phone]

## 2020-03-31 ENCOUNTER — Ambulatory Visit (INDEPENDENT_AMBULATORY_CARE_PROVIDER_SITE_OTHER): Payer: 59 | Admitting: Family Medicine

## 2020-03-31 ENCOUNTER — Encounter: Payer: Self-pay | Admitting: Family Medicine

## 2020-03-31 ENCOUNTER — Other Ambulatory Visit: Payer: Self-pay

## 2020-03-31 ENCOUNTER — Other Ambulatory Visit: Payer: Self-pay | Admitting: Family Medicine

## 2020-03-31 VITALS — BP 188/98 | HR 92 | Temp 97.2°F | Wt 176.4 lb

## 2020-03-31 DIAGNOSIS — I10 Essential (primary) hypertension: Secondary | ICD-10-CM

## 2020-03-31 DIAGNOSIS — Z720 Tobacco use: Secondary | ICD-10-CM | POA: Insufficient documentation

## 2020-03-31 MED ORDER — AMLODIPINE BESYLATE 5 MG PO TABS: 5.0000 mg | ORAL_TABLET | Freq: Every day | ORAL | 1 refills | Status: DC

## 2020-03-31 MED ORDER — BUPROPION HCL ER (SR) 150 MG PO TB12: 150.0000 mg | ORAL_TABLET | Freq: Every day | ORAL | 3 refills | Status: DC

## 2020-03-31 NOTE — Progress Notes (Signed)
Patient ID: Billy Mccormick, male    DOB: 1955-11-27, 64 y.o.   MRN: [id number]   Chief Complaint  Patient presents with  . Hypertension    Patient reports having high bp at another appointment and wanted to come in to discuss it. BP today 188/98.   Subjective:  CC: concerned about blood pressure, going to get AAA stented.  HPI In recent past, blood pressure has been controlled. At vascular surgeon's office recently and BP is high.   Medical History Billy Mccormick has a past medical history of Degenerative cervical disc, Essential hypertension (10/02/2015), Hyperlipidemia, IFG (impaired fasting glucose), Lumbar herniated disc (2015), and Psoriasis.   Outpatient Encounter Medications as of 03/31/2020  Medication Sig  . amLODipine (NORVASC) 5 MG tablet Take 1 tablet (5 mg total) by mouth daily.  Marland Kitchen buPROPion (WELLBUTRIN SR) 150 MG 12 hr tablet Take 1 tablet (150 mg total) by mouth daily.  . clobetasol cream (TEMOVATE) 0.05 % Apply bid to affected area  . enalapril (VASOTEC) 10 MG tablet TAKE 1 TABLET(5 MG) BY MOUTH DAILY  . pantoprazole (PROTONIX) 40 MG tablet TAKE 1 TABLET(40 MG) BY MOUTH DAILY   No facility-administered encounter medications on file as of 03/31/2020.     Review of Systems  Respiratory: Negative for chest tightness and shortness of breath.   Cardiovascular: Negative for chest pain and palpitations.  Neurological: Negative for dizziness, light-headedness and headaches.       Negative vision changes.      Vitals BP (!) 188/98   Pulse 92   Temp (!) 97.2 F (36.2 C)   Wt 176 lb 6.4 oz (80 kg)   SpO2 98%   BMI 22.96 kg/m   Objective:   Physical Exam Vitals and nursing note reviewed.  Constitutional:      General: He is not in acute distress.    Appearance: Normal appearance. He is normal weight.  Cardiovascular:     Rate and Rhythm: Normal rate and regular rhythm.     Heart sounds: Normal heart sounds.  Pulmonary:     Effort: Pulmonary effort is normal.      Breath sounds: Normal breath sounds.  Skin:    General: Skin is warm and dry.     Capillary Refill: Capillary refill takes less than 2 seconds.  Neurological:     Mental Status: He is alert and oriented to person, place, and time.  Psychiatric:        Mood and Affect: Mood normal.        Behavior: Behavior normal.      Assessment and Plan   1. Elevated blood pressure reading with diagnosis of hypertension - amLODipine (NORVASC) 5 MG tablet; Take 1 tablet (5 mg total) by mouth daily.  Dispense: 30 tablet; Refill: 1  2. Smoking trying to quit - buPROPion (WELLBUTRIN SR) 150 MG 12 hr tablet; Take 1 tablet (150 mg total) by mouth daily.  Dispense: 30 tablet; Refill: 3   Concerned with elevation of previously controlled blood pressure. Saw Dr. Donnetta Hutching on 9/13 to discuss AAA and BP was 154/101 in office. Today on recheck BP still 180/110 (has taken BP meds today).   He is trying to stop smoking: down from 22 cigs per day to 6 cigs currently. He is experiencing stressors with upcoming surgery for AAA and smoking cessation. He wishes to try Wellbutrin to assist with smoking cessation and understands the importance of stropping smoking in light of current health issues.  Will add amlodipine  5 mg by mouth daily and have him follow-up in one week for recheck. Last K+ level 5.4 on 03/17/20 (he was using supplements at that time).  He will follow-up in one month on smoking cessation.   Consulted with Dr. Lovena Le during this visit today for decision making advice.  Agrees with plan of care discussed today. Understands warning signs to seek further care: chest pain, shortness of breath, headaches, vision changes.  Understands to follow-up in one week and that his blood pressure will need to be controlled prior to surgery for AAA repair. He will check his BP at home and bring log with him.  Note: amlodipine 5 mg originally sent for 30 day supply and 1 refill. Updated to be 90 day supply with one refill  once patient arrived to pharmacy.   Pecolia Ades, FNP-C

## 2020-03-31 NOTE — Patient Instructions (Signed)
  Bring blood pressure log with one week follow-up. Call us if your new medication doesn't work- if you start having chest pain, shortness of breath or headaches, vision changes -- go to the ED.   Blood Pressure Record Sheet To take your blood pressure, you will need a blood pressure machine. You can buy a blood pressure machine (blood pressure monitor) at your clinic, drug store, or online. When choosing one, consider:  An automatic monitor that has an arm cuff.  A cuff that wraps snugly around your upper arm. You should be able to fit only one finger between your arm and the cuff.  A device that stores blood pressure reading results.  Do not choose a monitor that measures your blood pressure from your wrist or finger. Follow your health care provider's instructions for how to take your blood pressure. To use this form:  Get one reading in the morning (a.m.) before you take any medicines.  Get one reading in the evening (p.m.) before supper.  Take at least 2 readings with each blood pressure check. This makes sure the results are correct. Wait 1-2 minutes between measurements.  Write down the results in the spaces on this form.  Repeat this once a week, or as told by your health care provider.  Make a follow-up appointment with your health care provider to discuss the results. Blood pressure log Date: _______________________  a.m. _____________________(1st reading) _____________________(2nd reading)  p.m. _____________________(1st reading) _____________________(2nd reading) Date: _______________________  a.m. _____________________(1st reading) _____________________(2nd reading)  p.m. _____________________(1st reading) _____________________(2nd reading) Date: _______________________  a.m. _____________________(1st reading) _____________________(2nd reading)  p.m. _____________________(1st reading) _____________________(2nd reading) Date: _______________________  a.m.  _____________________(1st reading) _____________________(2nd reading)  p.m. _____________________(1st reading) _____________________(2nd reading) Date: _______________________  a.m. _____________________(1st reading) _____________________(2nd reading)  p.m. _____________________(1st reading) _____________________(2nd reading) This information is not intended to replace advice given to you by your health care provider. Make sure you discuss any questions you have with your health care provider. Document Revised: 08/31/2017 Document Reviewed: 07/03/2017 Elsevier Patient Education  [place].

## 2020-04-08 ENCOUNTER — Other Ambulatory Visit: Payer: Self-pay

## 2020-04-08 ENCOUNTER — Encounter: Payer: Self-pay | Admitting: Family Medicine

## 2020-04-08 ENCOUNTER — Ambulatory Visit: Payer: 59 | Admitting: Family Medicine

## 2020-04-08 VITALS — BP 136/90 | HR 88 | Temp 95.6°F | Ht 73.5 in | Wt 176.0 lb

## 2020-04-08 DIAGNOSIS — Z716 Tobacco abuse counseling: Secondary | ICD-10-CM | POA: Diagnosis not present

## 2020-04-08 DIAGNOSIS — I10 Essential (primary) hypertension: Secondary | ICD-10-CM

## 2020-04-08 MED ORDER — ENALAPRIL MALEATE 10 MG PO TABS: 10.0000 mg | ORAL_TABLET | Freq: Two times a day (BID) | ORAL | 2 refills | Status: DC

## 2020-04-08 NOTE — Progress Notes (Signed)
Patient ID: Billy Mccormick, male    DOB: 02-23-1956, 64 y.o.   MRN: [id number]   Chief Complaint  Patient presents with  . Hypertension   Subjective:    HPI   Pt here for one week follow up on blood pressure. No symptoms but readings have been all over the place. Checking blood pressure at home 2-4 times a day.   Supposed to have having stent in AAA in 04/28/20, bp has been going up. Pt stating feeling stress and anxious about the procedure and feels this is making his bp go up.  denis chest pain, ha, leg swelling, or dizziness.  Had high bp in vascular office 785 systolic. Pt was on 10mg  enalapril and now in last week added 5mg  norvasc.  Working on quitting smoking.  Medical History Billy Mccormick has a past medical history of Degenerative cervical disc, Essential hypertension (10/02/2015), Hyperlipidemia, IFG (impaired fasting glucose), Lumbar herniated disc (2015), and Psoriasis.   Outpatient Encounter Medications as of 04/08/2020  Medication Sig  . amLODipine (NORVASC) 5 MG tablet TAKE 1 TABLET(5 MG) BY MOUTH DAILY (Patient taking differently: Take 5 mg by mouth daily. )  . clobetasol cream (TEMOVATE) 0.05 % Apply bid to affected area (Patient not taking: Reported on 04/12/2020)  . enalapril (VASOTEC) 10 MG tablet Take 1 tablet (10 mg total) by mouth 2 (two) times daily. TAKE 1 TABLET(5 MG) BY MOUTH DAILY (Patient taking differently: Take 10 mg by mouth daily. )  . pantoprazole (PROTONIX) 40 MG tablet TAKE 1 TABLET(40 MG) BY MOUTH DAILY (Patient taking differently: Take 40 mg by mouth daily. )  . [DISCONTINUED] buPROPion (WELLBUTRIN SR) 150 MG 12 hr tablet Take 1 tablet (150 mg total) by mouth daily.  . [DISCONTINUED] enalapril (VASOTEC) 10 MG tablet TAKE 1 TABLET(5 MG) BY MOUTH DAILY   No facility-administered encounter medications on file as of 04/08/2020.     Review of Systems  Constitutional: Negative for chills and fever.  HENT: Negative for congestion, rhinorrhea and sore  throat.   Respiratory: Negative for cough, shortness of breath and wheezing.   Cardiovascular: Negative for chest pain and leg swelling.  Gastrointestinal: Negative for abdominal pain, diarrhea, nausea and vomiting.  Genitourinary: Negative for dysuria and frequency.  Skin: Negative for rash.  Neurological: Negative for dizziness, weakness and headaches.     Vitals BP 136/90   Pulse 88   Temp (!) 95.6 F (35.3 C)   Ht 6' 1.5" (1.867 m)   Wt 176 lb (79.8 kg)   SpO2 99%   BMI 22.91 kg/m   Objective:   Physical Exam Vitals and nursing note reviewed.  Constitutional:      General: He is not in acute distress.    Appearance: Normal appearance. He is not ill-appearing.  HENT:     Head: Normocephalic.     Nose: Nose normal. No congestion.     Mouth/Throat:     Mouth: Mucous membranes are moist.     Pharynx: No oropharyngeal exudate.  Eyes:     Extraocular Movements: Extraocular movements intact.     Conjunctiva/sclera: Conjunctivae normal.     Pupils: Pupils are equal, round, and reactive to light.  Cardiovascular:     Rate and Rhythm: Normal rate and regular rhythm.     Pulses: Normal pulses.     Heart sounds: Normal heart sounds. No murmur heard.   Pulmonary:     Effort: Pulmonary effort is normal.     Breath sounds:  Normal breath sounds. No wheezing, rhonchi or rales.  Musculoskeletal:        General: Normal range of motion.     Right lower leg: No edema.     Left lower leg: No edema.  Skin:    General: Skin is warm and dry.     Findings: No rash.  Neurological:     General: No focal deficit present.     Mental Status: He is alert and oriented to person, place, and time.     Cranial Nerves: No cranial nerve deficit.  Psychiatric:        Mood and Affect: Mood normal.        Behavior: Behavior normal.        Thought Content: Thought content normal.        Judgment: Judgment normal.      Assessment and Plan   1. Hypertension, unspecified type - enalapril  (VASOTEC) 10 MG tablet; Take 1 tablet (10 mg total) by mouth 2 (two) times daily. TAKE 1 TABLET(5 MG) BY MOUTH DAILY (Patient taking differently: Take 10 mg by mouth daily. )  Dispense: 60 tablet; Refill: 2  2. Encounter for smoking cessation counseling   Pt afraid to take the wellbutrin, pt wanting to try nicorette gum.   HTN- Pt to call back if seeing high or low bp numbers checking 2xper day.  Inc enlapril form 10mg  daily to bid. And cont with norvasc 5mg  daily. Pt to work on smoking cessation.  F/u prn after his procedure.

## 2020-04-19 ENCOUNTER — Ambulatory Visit (INDEPENDENT_AMBULATORY_CARE_PROVIDER_SITE_OTHER): Payer: 59 | Admitting: Cardiology

## 2020-04-19 ENCOUNTER — Other Ambulatory Visit: Payer: Self-pay

## 2020-04-19 ENCOUNTER — Encounter: Payer: Self-pay | Admitting: Cardiology

## 2020-04-19 VITALS — BP 148/84 | HR 68 | Ht 73.0 in | Wt 177.4 lb

## 2020-04-19 DIAGNOSIS — Z136 Encounter for screening for cardiovascular disorders: Secondary | ICD-10-CM

## 2020-04-19 DIAGNOSIS — I10 Essential (primary) hypertension: Secondary | ICD-10-CM

## 2020-04-19 DIAGNOSIS — E7800 Pure hypercholesterolemia, unspecified: Secondary | ICD-10-CM | POA: Diagnosis not present

## 2020-04-19 DIAGNOSIS — I714 Abdominal aortic aneurysm, without rupture, unspecified: Secondary | ICD-10-CM

## 2020-04-19 NOTE — Patient Instructions (Signed)
  Testing/Procedures:  CT CARDIAC SCORING AT [place]   Follow-Up: At [facility], you and your health needs are our priority.  As part of our continuing mission to provide you with exceptional heart care, we have created designated Provider Care Teams.  These Care Teams include your primary Cardiologist (physician) and Advanced Practice Providers (APPs -  Physician Assistants and Nurse Practitioners) who all work together to provide you with the care you need, when you need it.  We recommend signing up for the patient portal called "MyChart".  Sign up information is provided on this After Visit Summary.  MyChart is used to connect with patients for Virtual Visits (Telemedicine).  Patients are able to view lab/test results, encounter notes, upcoming appointments, etc.  Non-urgent messages can be sent to your provider as well.   To learn more about what you can do with MyChart, go to [email or web address].    Your next appointment:   6 month(s)  The format for your next appointment:   In Person  Provider:   You may see Kirk Ruths MD or one of the following Advanced Practice Providers on your designated Care Team:    Kerin Ransom, PA-C  [place], [place]  Coletta Memos, [place]

## 2020-04-19 NOTE — Progress Notes (Signed)
Referring-Todd Early MD Reason for referral-preoperative evaluation prior to abdominal aortic aneurysm repair  HPI: 64 year old male for preoperative evaluation prior to abdominal aortic aneurysm repair at request of Curt Jews MD. Note patient had a CT scan to screen for lung cancer June 2021 that showed three-vessel coronary atherosclerosis and emphysema.  CTA September 2021 showed infrarenal abdominal aortic aneurysm measuring 5.4 cm.  We are asked to evaluate preoperatively.  He is very active.  He can walk 2 to 3 miles with having no dyspnea or chest pain.  He mows his yard routinely and also can climb several flights of stairs with no chest pain.  There is no orthopnea, PND, pedal edema, claudication or syncope.  Current Outpatient Medications  Medication Sig Dispense Refill  . amLODipine (NORVASC) 5 MG tablet TAKE 1 TABLET(5 MG) BY MOUTH DAILY (Patient taking differently: Take 5 mg by mouth daily. ) 90 tablet 1  . aspirin EC 81 MG tablet Take 81 mg by mouth daily. Swallow whole.    . enalapril (VASOTEC) 10 MG tablet Take 1 tablet (10 mg total) by mouth 2 (two) times daily. TAKE 1 TABLET(5 MG) BY MOUTH DAILY (Patient taking differently: Take 10 mg by mouth daily. ) 60 tablet 2  . pantoprazole (PROTONIX) 40 MG tablet TAKE 1 TABLET(40 MG) BY MOUTH DAILY (Patient taking differently: Take 40 mg by mouth daily. ) 90 tablet 1  . clobetasol cream (TEMOVATE) 0.05 % Apply bid to affected area (Patient not taking: Reported on 04/12/2020) 30 g 1   No current facility-administered medications for this visit.    Allergies  Allergen Reactions  . Prednisone Anxiety     Past Medical History:  Diagnosis Date  . Degenerative cervical disc   . Essential hypertension 10/02/2015  . Hyperlipidemia   . IFG (impaired fasting glucose)   . Lumbar herniated disc 2015   L4,L5  . Psoriasis     Past Surgical History:  Procedure Laterality Date  . ACHILLES TENDON REPAIR     right  . COLONOSCOPY   06/25/2006   Dr. Gala Romney: extensive left-sided diverticula, 2 six to seven mm pale-appearing hyperplastic-appearing polypoid lesions vs atypcial appearing lipoma. (bx-hyperplastic polyps). next TCS 06/2016  . COLONOSCOPY N/A 06/21/2017   Procedure: COLONOSCOPY;  Surgeon: Daneil Dolin, MD;  [facility];  Service: Endoscopy;  Laterality: N/A;  9:45 AM  . ESOPHAGOGASTRODUODENOSCOPY N/A 11/28/2013   RSW:[id number] Schatzki's ring  not manipulated. Small hiatal hernia. Gastric ulcer and erosions as above. Bx antrum-extensive intestinal metaplasia, no H.pylori  . ESOPHAGOGASTRODUODENOSCOPY N/A 05/26/2014   Procedure: ESOPHAGOGASTRODUODENOSCOPY (EGD);  Surgeon: Daneil Dolin, MD;  [facility];  Service: Endoscopy;  Laterality: N/A;  7:30AM  . HERNIA REPAIR     x 2  . MALONEY DILATION N/A 11/28/2013   Procedure: Venia Minks DILATION;  Surgeon: Daneil Dolin, MD;  [facility];  Service: Endoscopy;  Laterality: N/A;  . NOSE SURGERY     x 2  . POLYPECTOMY  06/21/2017   Procedure: POLYPECTOMY;  Surgeon: Daneil Dolin, MD;  [facility];  Service: Endoscopy;;  colon  . SAVORY DILATION N/A 11/28/2013   Procedure: SAVORY DILATION;  Surgeon: Daneil Dolin, MD;  [facility];  Service: Endoscopy;  Laterality: N/A;    Social History   Socioeconomic History  . Marital status: Married    Spouse name: Not on file  . Number of children: 0  . Years of education: Not on  file  . Highest education level: Not on file  Occupational History  . Not on file  Tobacco Use  . Smoking status: Current Every Day Smoker    Packs/day: 1.00    Years: 35.00    Pack years: 35.00    Types: Cigarettes  . Smokeless tobacco: Never Used  . Tobacco comment: Currently down to 1/2 pack daily   Vaping Use  . Vaping Use: Never used  Substance and Sexual Activity  . Alcohol use: Yes    Alcohol/week: 10.0 standard drinks    Types: 7 Cans of beer, 3 Shots of liquor per  week    Comment: 6 beers about 3 days weekly  . Drug use: No  . Sexual activity: Not on file  Other Topics Concern  . Not on file  Social History Narrative  . Not on file   Social Determinants of Health   Financial Resource Strain:   . Difficulty of Paying Living Expenses: Not on file  Food Insecurity:   . Worried About Charity fundraiser in the Last Year: Not on file  . Ran Out of Food in the Last Year: Not on file  Transportation Needs:   . Lack of Transportation (Medical): Not on file  . Lack of Transportation (Non-Medical): Not on file  Physical Activity:   . Days of Exercise per Week: Not on file  . Minutes of Exercise per Session: Not on file  Stress:   . Feeling of Stress : Not on file  Social Connections:   . Frequency of Communication with Friends and Family: Not on file  . Frequency of Social Gatherings with Friends and Family: Not on file  . Attends Religious Services: Not on file  . Active Member of Clubs or Organizations: Not on file  . Attends Archivist Meetings: Not on file  . Marital Status: Not on file  Intimate Partner Violence:   . Fear of Current or Ex-Partner: Not on file  . Emotionally Abused: Not on file  . Physically Abused: Not on file  . Sexually Abused: Not on file    Family History  Problem Relation Age of Onset  . Hyperlipidemia Mother   . Hyperlipidemia Father   . Kidney disease Brother   . Colon cancer Neg Hx     ROS: no fevers or chills, productive cough, hemoptysis, dysphasia, odynophagia, melena, hematochezia, dysuria, hematuria, rash, seizure activity, orthopnea, PND, pedal edema, claudication. Remaining systems are negative.  Physical Exam:   Blood pressure (!) 148/84, pulse 68, height 6\' 1"  (1.854 m), weight 177 lb 6.4 oz (80.5 kg), SpO2 96 %.  General:  Well developed/well nourished in NAD Skin warm/dry Patient not depressed No peripheral clubbing Back-normal HEENT-normal/normal eyelids Neck supple/normal  carotid upstroke bilaterally; no bruits; no JVD; no thyromegaly chest - CTA/ normal expansion CV - RRR/normal S1 and S2; no murmurs, rubs or gallops;  PMI nondisplaced Abdomen -NT/ND, no HSM, no mass, + bowel sounds, abdominal bruit noted 2+ femoral pulses, no bruits Ext-no edema, chords, 2+ DP Neuro-grossly nonfocal  ECG -sinus rhythm at a rate of 68, no ST changes.  Personally reviewed  A/P  1 preoperative evaluation prior to repair of abdominal aortic aneurysm-patient is scheduled to have stent graft of his abdominal aortic aneurysm.  He has excellent functional capacity with no symptoms of dyspnea or chest pain.  His electrocardiogram is normal.  We will therefore not pursue further ischemia evaluation preoperatively.  2 coronary artery disease-he was noted to have  coronary calcification on his previous CT scan.  We would treat with aspirin 81 mg daily.  I have also recommended a statin and he will ask his primary care physician to prescribe this.  I would recommend Crestor 40 mg daily with lipids and liver in 12 weeks.  We will plan a formal calcium score for risk assessment.  3 hypertension-blood pressure mildly elevated today but he follows this at home and it is typically controlled.  Continue present medications and follow.  4 hyperlipidemia-given documented vascular disease I have recommended a statin as outlined above.  5 tobacco abuse-patient counseled on discontinuing.  Kirk Ruths, MD

## 2020-04-20 NOTE — Progress Notes (Signed)
Your procedure is scheduled on Wednesday, April 28, 2020.  Report to [facility] Main Entrance "A" at 6:30 A.M., and check in at the Admitting office.  Call this number if you have problems the morning of surgery:  [phone]  Call [phone] if you have any questions prior to your surgery date Monday-Friday 8am-4pm    Remember:  Do not eat or drink after midnight the night before your surgery   Take these medicines the morning of surgery with A SIP OF WATER:  amLODipine (NORVASC) pantoprazole (PROTONIX)   Follow your surgeon's instructions on when to stop Aspirin.  If no instructions were given by your surgeon then you will need to call the office to get those instructions.     As of today, STOP taking any Aleve, Naproxen, Ibuprofen, Motrin, Advil, Goody's, BC's, all herbal medications, fish oil, and all vitamins.                      Do not wear jewelry.            Do not wear lotions, powders, colognes, or deodorant.            Do not shave 48 hours prior to surgery.  Men may shave face and neck.            Do not bring valuables to the hospital.            [facility] is not responsible for any belongings or valuables.  Do NOT Smoke (Tobacco/Vaping) or drink Alcohol 24 hours prior to your procedure If you use a CPAP at night, you may bring all equipment for your overnight stay.   Contacts, glasses, dentures or bridgework may not be worn into surgery.      For patients admitted to the hospital, discharge time will be determined by your treatment team.   Patients discharged the day of surgery will not be allowed to drive home, and someone needs to stay with them for 24 hours.    Special instructions:   [place]- Preparing For Surgery  Before surgery, you can play an important role. Because skin is not sterile, your skin needs to be as free of germs as possible. You can reduce the number of germs on your skin by washing with CHG (chlorahexidine gluconate) Soap  before surgery.  CHG is an antiseptic cleaner which kills germs and bonds with the skin to continue killing germs even after washing.    Oral Hygiene is also important to reduce your risk of infection.  Remember - BRUSH YOUR TEETH THE MORNING OF SURGERY WITH YOUR REGULAR TOOTHPASTE  Please do not use if you have an allergy to CHG or antibacterial soaps. If your skin becomes reddened/irritated stop using the CHG.  Do not shave (including legs and underarms) for at least 48 hours prior to first CHG shower. It is OK to shave your face.  Please follow these instructions carefully.   1. Shower the NIGHT BEFORE SURGERY and the MORNING OF SURGERY with CHG Soap.   2. If you chose to wash your hair, wash your hair first as usual with your normal shampoo.  3. After you shampoo, rinse your hair and body thoroughly to remove the shampoo.  4. Use CHG as you would any other liquid soap. You can apply CHG directly to the skin and wash gently with a scrungie or a clean washcloth.   5. Apply the CHG Soap to your body ONLY FROM THE  NECK DOWN.  Do not use on open wounds or open sores. Avoid contact with your eyes, ears, mouth and genitals (private parts). Wash Face and genitals (private parts)  with your normal soap.   6. Wash thoroughly, paying special attention to the area where your surgery will be performed.  7. Thoroughly rinse your body with warm water from the neck down.  8. DO NOT shower/wash with your normal soap after using and rinsing off the CHG Soap.  9. Pat yourself dry with a CLEAN TOWEL.  10. Wear CLEAN PAJAMAS to bed the night before surgery  11. Place CLEAN SHEETS on your bed the night of your first shower and DO NOT SLEEP WITH PETS.   Day of Surgery: Wear Clean/Comfortable clothing the morning of surgery Do not apply any deodorants/lotions.   Remember to brush your teeth WITH YOUR REGULAR TOOTHPASTE.   Please read over the following fact sheets that you were given.

## 2020-04-21 ENCOUNTER — Encounter (HOSPITAL_COMMUNITY)
Admission: RE | Admit: 2020-04-21 | Discharge: 2020-04-21 | Disposition: A | Discharge status: Home / Self Care | Payer: 59 | Source: Ambulatory Visit | Attending: Vascular Surgery | Admitting: Vascular Surgery

## 2020-04-21 ENCOUNTER — Encounter (HOSPITAL_COMMUNITY): Payer: Self-pay

## 2020-04-21 ENCOUNTER — Other Ambulatory Visit: Payer: Self-pay

## 2020-04-21 DIAGNOSIS — M5030 Other cervical disc degeneration, unspecified cervical region: Secondary | ICD-10-CM | POA: Insufficient documentation

## 2020-04-21 DIAGNOSIS — R7303 Prediabetes: Secondary | ICD-10-CM | POA: Diagnosis not present

## 2020-04-21 DIAGNOSIS — I700 Atherosclerosis of aorta: Secondary | ICD-10-CM | POA: Insufficient documentation

## 2020-04-21 DIAGNOSIS — I714 Abdominal aortic aneurysm, without rupture: Secondary | ICD-10-CM | POA: Insufficient documentation

## 2020-04-21 DIAGNOSIS — Z7982 Long term (current) use of aspirin: Secondary | ICD-10-CM | POA: Diagnosis not present

## 2020-04-21 DIAGNOSIS — Z01812 Encounter for preprocedural laboratory examination: Secondary | ICD-10-CM | POA: Insufficient documentation

## 2020-04-21 DIAGNOSIS — L409 Psoriasis, unspecified: Secondary | ICD-10-CM | POA: Diagnosis not present

## 2020-04-21 DIAGNOSIS — Z79899 Other long term (current) drug therapy: Secondary | ICD-10-CM | POA: Diagnosis not present

## 2020-04-21 DIAGNOSIS — I10 Essential (primary) hypertension: Secondary | ICD-10-CM | POA: Diagnosis not present

## 2020-04-21 DIAGNOSIS — E785 Hyperlipidemia, unspecified: Secondary | ICD-10-CM | POA: Diagnosis not present

## 2020-04-21 LAB — COMPREHENSIVE METABOLIC PANEL
ALT: 27 U/L (ref 0–44)
AST: 27 U/L (ref 15–41)
Albumin: 3.9 g/dL (ref 3.5–5.0)
Alkaline Phosphatase: 58 U/L (ref 38–126)
Anion gap: 9 (ref 5–15)
BUN: 19 mg/dL (ref 8–23)
CO2: 21 mmol/L — ABNORMAL LOW (ref 22–32)
Calcium: 9.2 mg/dL (ref 8.9–10.3)
Chloride: 107 mmol/L (ref 98–111)
Creatinine, Ser: 0.92 mg/dL (ref 0.61–1.24)
GFR calc non Af Amer: >60 mL/min (ref >60)
Glucose, Bld: 109 mg/dL — ABNORMAL HIGH (ref 70–99)
Potassium: 4.9 mmol/L (ref 3.5–5.1)
Sodium: 137 mmol/L (ref 135–145)
Total Bilirubin: 0.8 mg/dL (ref 0.3–1.2)
Total Protein: 7.2 g/dL (ref 6.5–8.1)

## 2020-04-21 LAB — URINALYSIS, ROUTINE W REFLEX MICROSCOPIC
Bacteria, UA: NONE SEEN
Bilirubin Urine: NEGATIVE
Glucose, UA: NEGATIVE mg/dL
Ketones, ur: NEGATIVE mg/dL
Nitrite: NEGATIVE
Protein, ur: NEGATIVE mg/dL
Specific Gravity, Urine: 1.01 (ref 1.005–1.030)
pH: 6 (ref 5.0–8.0)

## 2020-04-21 LAB — CBC
HCT: 46.6 % (ref 39.0–52.0)
Hemoglobin: 15.5 g/dL (ref 13.0–17.0)
MCH: 30.8 pg (ref 26.0–34.0)
MCHC: 33.3 g/dL (ref 30.0–36.0)
MCV: 92.5 fL (ref 80.0–100.0)
Platelets: 272 10*3/uL (ref 150–400)
RBC: 5.04 MIL/uL (ref 4.22–5.81)
RDW: 13 % (ref 11.5–15.5)
WBC: 8.3 10*3/uL (ref 4.0–10.5)
nRBC: 0 % (ref 0.0–0.2)

## 2020-04-21 LAB — TYPE AND SCREEN
ABO/RH(D): A POS
Antibody Screen: NEGATIVE

## 2020-04-21 LAB — SURGICAL PCR SCREEN
MRSA, PCR: NEGATIVE
Staphylococcus aureus: NEGATIVE

## 2020-04-21 LAB — PROTIME-INR
INR: 0.9 (ref 0.8–1.2)
Prothrombin Time: 11.8 seconds (ref 11.4–15.2)

## 2020-04-21 LAB — APTT: aPTT: 30 seconds (ref 24–36)

## 2020-04-21 NOTE — Progress Notes (Signed)
IB msg sent to [facility] RN in Dr. Luther Parody office about pt's UA results.

## 2020-04-21 NOTE — Progress Notes (Signed)
PCP - Dr. Elvia Collum Cardiologist - Dr. Kirk Ruths  PPM/ICD - Denies  Chest x-ray - N/A EKG - 04/17/20 Stress Test - Denies ECHO - Denies Cardiac Cath - Denies  Sleep Study - Denies  Patient Denies being diabetic.   Blood Thinner Instructions: N/A Aspirin Instructions: Patient instructed to contact surgeon's office.  ERAS Protcol - No   COVID TEST- 04/27/20   Anesthesia review: Yes, OSA score indicated 'at risk'  Patient denies shortness of breath, fever, cough and chest pain at PAT appointment   All instructions explained to the patient, with a verbal understanding of the material. Patient agrees to go over the instructions while at home for a better understanding. Patient also instructed to self quarantine after being tested for COVID-19. The opportunity to ask questions was provided.

## 2020-04-21 NOTE — Progress Notes (Signed)
   04/21/20 0910  OBSTRUCTIVE SLEEP APNEA  Have you ever been diagnosed with sleep apnea through a sleep study? No  Do you snore loudly (loud enough to be heard through closed doors)?  1  Do you often feel tired, fatigued, or sleepy during the daytime (such as falling asleep during driving or talking to someone)? 0  Has anyone observed you stop breathing during your sleep? 1  Do you have, or are you being treated for high blood pressure? 1  BMI more than 35 kg/m2? 0  Age > 50 (1-yes) 1  Neck circumference greater than:Male 16 inches or larger, Male 17inches or larger? 0  Male Gender (Yes=1) 1  Obstructive Sleep Apnea Score 5  Score 5 or greater  Results sent to PCP

## 2020-04-22 ENCOUNTER — Telehealth: Payer: Self-pay | Admitting: Family Medicine

## 2020-04-22 ENCOUNTER — Telehealth: Payer: Self-pay

## 2020-04-22 ENCOUNTER — Ambulatory Visit (INDEPENDENT_AMBULATORY_CARE_PROVIDER_SITE_OTHER): Payer: 59 | Admitting: Family Medicine

## 2020-04-22 ENCOUNTER — Encounter: Payer: Self-pay | Admitting: Family Medicine

## 2020-04-22 ENCOUNTER — Telehealth: Payer: Self-pay | Admitting: *Deleted

## 2020-04-22 VITALS — BP 136/94 | HR 72 | Temp 97.6°F | Wt 176.0 lb

## 2020-04-22 DIAGNOSIS — I714 Abdominal aortic aneurysm, without rupture, unspecified: Secondary | ICD-10-CM

## 2020-04-22 DIAGNOSIS — R82998 Other abnormal findings in urine: Secondary | ICD-10-CM

## 2020-04-22 DIAGNOSIS — Z720 Tobacco use: Secondary | ICD-10-CM | POA: Diagnosis not present

## 2020-04-22 DIAGNOSIS — I10 Essential (primary) hypertension: Secondary | ICD-10-CM | POA: Diagnosis not present

## 2020-04-22 DIAGNOSIS — Z0000 Encounter for general adult medical examination without abnormal findings: Secondary | ICD-10-CM

## 2020-04-22 MED ORDER — ATORVASTATIN CALCIUM 40 MG PO TABS: 40.0000 mg | ORAL_TABLET | Freq: Every day | ORAL | 1 refills | Status: DC

## 2020-04-22 MED ORDER — ROSUVASTATIN CALCIUM 10 MG PO TABS: ORAL_TABLET | ORAL | 1 refills | Status: DC

## 2020-04-22 MED ORDER — CIPROFLOXACIN HCL 500 MG PO TABS: 500.0000 mg | ORAL_TABLET | Freq: Two times a day (BID) | ORAL | 0 refills | Status: AC

## 2020-04-22 NOTE — Progress Notes (Signed)
Anesthesia Chart Review:  Case: [id number] Date/Time: 04/28/20 0815   Procedure: ABDOMINAL AORTIC ENDOVASCULAR STENT GRAFT (N/A )   Anesthesia type: General   Pre-op diagnosis: AAA   Location: [facility] OR ROOM 26 / [place] [facility]   Surgeons: Rosetta Posner, MD      DISCUSSION: Patient is a 64 year old male scheduled for the above procedure.  History includes smoking, AAA, HTN, HLD, impaired fasting glucose, psoriasis, degenerative cervical disc disease.  He had preoperative cardiac evaluation with Dr. Stanford Breed on 04/19/20: "preoperative evaluation prior to repair of abdominal aortic aneurysm-patient is scheduled to have stent graft of his abdominal aortic aneurysm.  He has excellent functional capacity with no symptoms of dyspnea or chest pain.  His electrocardiogram is normal.  We will therefore not pursue further ischemia evaluation preoperatively." He recommended ASA and statin (Crestor 40 mg) for finding of coronary calcifications on imaging and will plan to get formal calcium score for risk assessment in the future.   He had primary care annual exam visit 04/22/20 with Lovena Le, Malena M, DO.  OSA screening score of 5.  Presurgical COVID-19 test is scheduled for 04/27/2020. Anesthesia team to evaluate on the day of surgery.    VS: BP 136/90   Pulse 70   Temp 36.9 C   Resp 17   Ht 6\' 1"  (1.854 m)   Wt 80.2 kg   SpO2 98%   BMI 23.31 kg/m    PROVIDERS: Erven Colla, DO is PCP  Kirk Ruths, MD is cardiologist. Seen once 04/19/20 for preoperative evaluation. Six month follow-up planned.    LABS: Labs reviewed: Acceptable for surgery. (all labs ordered are listed, but only abnormal results are displayed)  Labs Reviewed  COMPREHENSIVE METABOLIC PANEL - Abnormal; Notable for the following components:      Result Value   CO2 21 (*)    Glucose, Bld 109 (*)    All other components within normal limits  URINALYSIS, ROUTINE W REFLEX MICROSCOPIC - Abnormal; Notable for the following  components:   Color, Urine STRAW (*)    Hgb urine dipstick SMALL (*)    Leukocytes,Ua TRACE (*)    All other components within normal limits  SURGICAL PCR SCREEN  APTT  CBC  PROTIME-INR  TYPE AND SCREEN    IMAGES: CTA abd/pelvis 03/18/20: IMPRESSION: VASCULAR 1. Infrarenal abdominal aortic aneurysm measuring up to 5.4 cm. Aneurysm has enlarged since 2017 when it measured 4.1 cm. Large amount of mural thrombus within the aneurysm. 2. Mild atherosclerotic disease involving the iliac arteries. Left common iliac artery is tortuous. NON-VASCULAR 1. **An incidental finding of potential clinical significance has been found. Extensive colonic diverticulosis with mild pericolonic inflammation in the left lower quadrant associated with the proximal sigmoid colon. Findings are suggestive for acute diverticulitis. No evidence for perforation or abscess formation.** 2. Mild anterolisthesis at L5-S1 with bilateral pars defects at L5. - These results will be called to the ordering clinician or representative by the Radiologist Assistant, and communication documented in the PACS or [employer].  CT Chest (lung cancer screening) 01/05/20: IMPRESSION: 1. Lung-RADS 2, benign appearance or behavior. Continue annual screening with low-dose chest CT without contrast in 12 months. 2. Three-vessel coronary atherosclerosis. 3. Aortic Atherosclerosis (ICD10-I70.0) and Emphysema (ICD10-J43.9).   EKG: 04/17/20: NSR with sinus arrhythmia.   CV: Denied prior stress test, echocardiogram, cardiac cath.   Past Medical History:  Diagnosis Date  . Degenerative cervical disc   . Essential hypertension 10/02/2015  . Hyperlipidemia   .  IFG (impaired fasting glucose)   . Lumbar herniated disc 2015   L4,L5  . Psoriasis     Past Surgical History:  Procedure Laterality Date  . ACHILLES TENDON REPAIR     right  . COLONOSCOPY  06/25/2006   Dr. Gala Romney: extensive left-sided diverticula, 2 six to seven  mm pale-appearing hyperplastic-appearing polypoid lesions vs atypcial appearing lipoma. (bx-hyperplastic polyps). next TCS 06/2016  . COLONOSCOPY N/A 06/21/2017   Procedure: COLONOSCOPY;  Surgeon: Daneil Dolin, MD;  [facility];  Service: Endoscopy;  Laterality: N/A;  9:45 AM  . ESOPHAGOGASTRODUODENOSCOPY N/A 11/28/2013   FYB:[id number] Schatzki's ring  not manipulated. Small hiatal hernia. Gastric ulcer and erosions as above. Bx antrum-extensive intestinal metaplasia, no H.pylori  . ESOPHAGOGASTRODUODENOSCOPY N/A 05/26/2014   Procedure: ESOPHAGOGASTRODUODENOSCOPY (EGD);  Surgeon: Daneil Dolin, MD;  [facility];  Service: Endoscopy;  Laterality: N/A;  7:30AM  . HERNIA REPAIR     x 2  . MALONEY DILATION N/A 11/28/2013   Procedure: Venia Minks DILATION;  Surgeon: Daneil Dolin, MD;  [facility];  Service: Endoscopy;  Laterality: N/A;  . NOSE SURGERY     x 2  . POLYPECTOMY  06/21/2017   Procedure: POLYPECTOMY;  Surgeon: Daneil Dolin, MD;  [facility];  Service: Endoscopy;;  colon  . SAVORY DILATION N/A 11/28/2013   Procedure: SAVORY DILATION;  Surgeon: Daneil Dolin, MD;  [facility];  Service: Endoscopy;  Laterality: N/A;    MEDICATIONS: . amLODipine (NORVASC) 5 MG tablet  . aspirin EC 81 MG tablet  . atorvastatin (LIPITOR) 40 MG tablet  . clobetasol cream (TEMOVATE) 0.05 %  . enalapril (VASOTEC) 10 MG tablet  . pantoprazole (PROTONIX) 40 MG tablet   No current facility-administered medications for this encounter.  He is to continue aspirin per [facility].   Myra Gianotti, PA-C Surgical Short Stay/Anesthesiology [facility] Phone [phone] [facility] Phone [phone] 04/22/2020 12:39 PM

## 2020-04-22 NOTE — Telephone Encounter (Signed)
Patient was seen today and forgot to discuss his lipids with Dr. Lovena Le. Per his cardiologist -he will need to be on a statin for the rest of his life. Patient was hoping to discuss this with Dr. Lovena Le and hoping she would prescribe something since he will be coming to our office more often than the cardio. Please advise.

## 2020-04-22 NOTE — Telephone Encounter (Signed)
Wife stated Heart doc had recommended Crestor 40mg  -- do you feel this medication would be okay and would you be willing to prescribe that?

## 2020-04-22 NOTE — Telephone Encounter (Signed)
Ok, please send crestor 10mg  daily. 90 tab with 1 refill.   Pls cancel the atorvastatin.   Thx.   Dr .Lovena Le

## 2020-04-22 NOTE — Telephone Encounter (Signed)
Crestor sent to pharmacy and Atorvastatin cancelled. Pt wife (DPR) is aware

## 2020-04-22 NOTE — Addendum Note (Signed)
Addended by: Vicente Males on: 04/22/2020 04:29 PM   Modules accepted: Orders

## 2020-04-22 NOTE — Telephone Encounter (Signed)
Pt contacted office. Pt was in this morning and was put on Atorvastatin. Pt states that he saw his cardiologist Monday and he recommends Crestor. Pt would like Crestor sent to pharmacy, if permissible. Please advise. Thank you

## 2020-04-22 NOTE — Progress Notes (Signed)
Patient ID: Billy Mccormick, male    DOB: 1956-03-18, 64 y.o.   MRN: [id number]   Chief Complaint  Patient presents with  . Annual Exam    Patient having abdominal aorta surg 10/13. Would like to get flu shot today. Would like to have his right ear looked at, feels blocked up. Concerns about getting up at night to urinate, currently using otc prostate health. Questions regarding starting wellbutrin after his surgery to help quit smoking.    Subjective:    HPI The patient comes in today for a wellness visit.  A review of their health history was completed.  A review of medications was also completed.  Eating habits: pretty good  Falls/  MVA accidents in past few months: none  Regular exercise: working outside regularly  Specialist pt sees on regular basis: none  Preventative health issues were discussed.   Additional concerns: none  Pt is confused why it's elevated in office and numbers at home are in 108-130s.   Pt on otc herbal supplement for prostate to help with nocturia.  Having surgery this month to repair AAA. Wanting to quit smoking and decreasing on his own.  Will try wellbutrin after his surgery.  Medical History Billy Mccormick has a past medical history of Degenerative cervical disc, Essential hypertension (10/02/2015), Hyperlipidemia, IFG (impaired fasting glucose), Lumbar herniated disc (2015), and Psoriasis.   Outpatient Encounter Medications as of 04/22/2020  Medication Sig  . amLODipine (NORVASC) 5 MG tablet TAKE 1 TABLET(5 MG) BY MOUTH DAILY (Patient taking differently: Take 5 mg by mouth daily. )  . aspirin EC 81 MG tablet Take 81 mg by mouth daily. Swallow whole. (Patient not taking: Reported on 04/22/2020)  . clobetasol cream (TEMOVATE) 0.05 % Apply bid to affected area (Patient not taking: Reported on 04/12/2020)  . enalapril (VASOTEC) 10 MG tablet Take 1 tablet (10 mg total) by mouth 2 (two) times daily. TAKE 1 TABLET(5 MG) BY MOUTH DAILY (Patient taking  differently: Take 10 mg by mouth daily. )  . pantoprazole (PROTONIX) 40 MG tablet TAKE 1 TABLET(40 MG) BY MOUTH DAILY (Patient taking differently: Take 40 mg by mouth daily. )  . [DISCONTINUED] atorvastatin (LIPITOR) 40 MG tablet Take 1 tablet (40 mg total) by mouth daily.   No facility-administered encounter medications on file as of 04/22/2020.     Review of Systems  Constitutional: Negative for chills and fever.  HENT: Negative for congestion, rhinorrhea and sore throat.   Respiratory: Negative for cough, shortness of breath and wheezing.   Cardiovascular: Negative for chest pain and leg swelling.  Gastrointestinal: Negative for abdominal pain, diarrhea, nausea and vomiting.  Genitourinary: Negative for dysuria and frequency.  Skin: Negative for rash.  Neurological: Negative for dizziness, weakness and headaches.     Vitals BP (!) 136/94   Pulse 72   Temp 97.6 F (36.4 C)   Wt 176 lb (79.8 kg)   SpO2 99%   BMI 23.22 kg/m   Objective:   Physical Exam Vitals and nursing note reviewed.  Constitutional:      General: He is not in acute distress.    Appearance: Normal appearance. He is not ill-appearing.  HENT:     Head: Normocephalic.     Right Ear: Tympanic membrane, ear canal and external ear normal.     Left Ear: Tympanic membrane, ear canal and external ear normal.     Nose: Nose normal. No congestion.     Mouth/Throat:     Mouth:  Mucous membranes are moist.     Pharynx: No oropharyngeal exudate.  Eyes:     Extraocular Movements: Extraocular movements intact.     Conjunctiva/sclera: Conjunctivae normal.     Pupils: Pupils are equal, round, and reactive to light.  Cardiovascular:     Rate and Rhythm: Normal rate and regular rhythm.     Pulses: Normal pulses.     Heart sounds: Normal heart sounds. No murmur heard.   Pulmonary:     Effort: Pulmonary effort is normal. No respiratory distress.     Breath sounds: Normal breath sounds. No wheezing, rhonchi or rales.   Abdominal:     General: Abdomen is flat. Bowel sounds are normal. There is no distension.     Palpations: Abdomen is soft. There is no mass.     Tenderness: There is no abdominal tenderness. There is no guarding or rebound.     Hernia: No hernia is present.     Comments: +palpable AAA.  Musculoskeletal:        General: Normal range of motion.     Cervical back: Normal range of motion.     Right lower leg: No edema.     Left lower leg: No edema.  Skin:    General: Skin is warm and dry.     Findings: No rash.  Neurological:     General: No focal deficit present.     Mental Status: He is alert and oriented to person, place, and time.     Cranial Nerves: No cranial nerve deficit.  Psychiatric:        Mood and Affect: Mood normal.        Behavior: Behavior normal.        Thought Content: Thought content normal.        Judgment: Judgment normal.      Assessment and Plan   1. Well adult exam  2. Abdominal aortic aneurysm (AAA) without rupture (HCC)  3. Smoking trying to quit  4. Essential hypertension   HTN- Cards- wanted average around 130/85 or under. After increased the lisinopril from 1tab daily.  And then taking 10mg  bid now for bp.    Smoking cessation- Pt wanting to quit. Going 1.5 days w/o smoking. Trying to go a whole day w/o smoking. Wanting to take wellbutrin after his surgery.  Wanting flu vaccine if it's under $30 for cash price.  Pt having AAA repair on 04/22/20.seen by cards and cleared for surgery on 04/19/20.  F/u 64mo for HTN.

## 2020-04-22 NOTE — Telephone Encounter (Signed)
Pt informed of UA results and Rx for ciprofloxacin 500 mg one tablet by mouth twice daily x 7 days will be sent to pharmacy. Pt voiced understanding.

## 2020-04-22 NOTE — Telephone Encounter (Signed)
Have him get the medication from his cardiologist or vascular doctor.  Not sure what they recommended and seems that he needs to go with their advise.   Dr.Jacobi Nile

## 2020-04-22 NOTE — Telephone Encounter (Signed)
Yes, will advise taking lipitor 40mg  at night bedtime. Will recheck labs in 70mo.  If having body aches/leg cramping on this then call us back.  Dr. Lovena Le

## 2020-04-22 NOTE — Anesthesia Preprocedure Evaluation (Addendum)
Anesthesia Evaluation  Patient identified by MRN, date of birth, ID band Patient awake    Reviewed: Allergy & Precautions, NPO status , Patient's Chart, lab work & pertinent test results  Airway Mallampati: III  TM Distance: >3 FB Neck ROM: Full    Dental no notable dental hx.    Pulmonary Current Smoker and Patient abstained from smoking.,    Pulmonary exam normal breath sounds clear to auscultation       Cardiovascular hypertension, Pt. on medications Normal cardiovascular exam Rhythm:Regular Rate:Normal     Neuro/Psych negative neurological ROS  negative psych ROS   GI/Hepatic Neg liver ROS, PUD, GERD  Medicated and Controlled,  Endo/Other  negative endocrine ROS  Renal/GU negative Renal ROS     Musculoskeletal  (+) Arthritis ,   Abdominal   Peds  Hematology HLD   Anesthesia Other Findings AAA  Reproductive/Obstetrics                            Anesthesia Physical Anesthesia Plan  ASA: III  Anesthesia Plan: General   Post-op Pain Management:    Induction: Intravenous  PONV Risk Score and Plan: 1 and Ondansetron, Dexamethasone, Midazolam and Treatment may vary due to age or medical condition  Airway Management Planned: Oral ETT  Additional Equipment: Arterial line  Intra-op Plan:   Post-operative Plan: Extubation in OR  Informed Consent: I have reviewed the patients History and Physical, chart, labs and discussed the procedure including the risks, benefits and alternatives for the proposed anesthesia with the patient or authorized representative who has indicated his/her understanding and acceptance.     Dental advisory given  Plan Discussed with: CRNA  Anesthesia Plan Comments: (Reviewed PAT note written 04/22/2020 by Myra Gianotti, PA-C. )       Anesthesia Quick Evaluation

## 2020-04-23 ENCOUNTER — Ambulatory Visit: Payer: 59 | Admitting: Cardiology

## 2020-04-23 ENCOUNTER — Other Ambulatory Visit: Payer: Self-pay | Admitting: *Deleted

## 2020-04-23 ENCOUNTER — Telehealth: Payer: Self-pay | Admitting: *Deleted

## 2020-04-23 DIAGNOSIS — I10 Essential (primary) hypertension: Secondary | ICD-10-CM

## 2020-04-23 DIAGNOSIS — E785 Hyperlipidemia, unspecified: Secondary | ICD-10-CM

## 2020-04-23 NOTE — Telephone Encounter (Signed)
If he's recommending that dose then the cardiologist can prescribe and get labs that he is requiring.  Once pt is stable on the medication we can take over the refills.   Thanks,   Dr. Lovena Le

## 2020-04-23 NOTE — Telephone Encounter (Signed)
Patient called back with a question about his Crestor. Patient states the Cardiologist stated he needs to be on Crestor 40 mg daily and recheck in 12 weeks. Crestor 10mg  was sent in to pharmacy.  Patient would like the prescription for 40mg  sent to pharmacy. Patient states he wants all his meds to come from our office and does not want to get med from cardiology.

## 2020-04-24 ENCOUNTER — Other Ambulatory Visit (HOSPITAL_COMMUNITY): Payer: 59

## 2020-04-26 ENCOUNTER — Encounter: Payer: Self-pay | Admitting: Family Medicine

## 2020-04-26 MED ORDER — ROSUVASTATIN CALCIUM 20 MG PO TABS: ORAL_TABLET | ORAL | 3 refills | Status: DC

## 2020-04-26 NOTE — Addendum Note (Signed)
Addended by: Vicente Males on: 04/26/2020 02:43 PM   Modules accepted: Orders

## 2020-04-26 NOTE — Telephone Encounter (Signed)
Left message to return call 

## 2020-04-26 NOTE — Telephone Encounter (Signed)
Pls order cmp, and lipid panel. Fasting about 1 wk before his next visit.  Next visit in 3 months.  Thx. Dr. Lovena Le

## 2020-04-26 NOTE — Telephone Encounter (Signed)
Lab orders placed and mailed to patient. Pt is aware

## 2020-04-26 NOTE — Telephone Encounter (Signed)
Pt returned call and verbalized understanding. 20 mg Crestor sent to pharmacy. What labs should we order for patient? Please advise. Thank you (Informed pt that we would mail lab orders to him)

## 2020-04-27 ENCOUNTER — Encounter (HOSPITAL_COMMUNITY): Payer: Self-pay | Admitting: Vascular Surgery

## 2020-04-27 ENCOUNTER — Other Ambulatory Visit (HOSPITAL_COMMUNITY)
Admission: RE | Admit: 2020-04-27 | Discharge: 2020-04-27 | Disposition: A | Discharge status: Home / Self Care | Payer: 59 | Source: Ambulatory Visit | Attending: Vascular Surgery | Admitting: Vascular Surgery

## 2020-04-27 ENCOUNTER — Other Ambulatory Visit: Payer: Self-pay | Admitting: Family Medicine

## 2020-04-27 DIAGNOSIS — I10 Essential (primary) hypertension: Secondary | ICD-10-CM

## 2020-04-27 DIAGNOSIS — Z20822 Contact with and (suspected) exposure to covid-19: Secondary | ICD-10-CM | POA: Insufficient documentation

## 2020-04-27 DIAGNOSIS — Z01812 Encounter for preprocedural laboratory examination: Secondary | ICD-10-CM | POA: Insufficient documentation

## 2020-04-27 LAB — SARS CORONAVIRUS 2 (TAT 6-24 HRS): SARS Coronavirus 2: NEGATIVE

## 2020-04-28 ENCOUNTER — Inpatient Hospital Stay (HOSPITAL_COMMUNITY): Payer: 59 | Admitting: Certified Registered Nurse Anesthetist

## 2020-04-28 ENCOUNTER — Inpatient Hospital Stay (HOSPITAL_COMMUNITY): Payer: 59 | Admitting: Vascular Surgery

## 2020-04-28 ENCOUNTER — Encounter (HOSPITAL_COMMUNITY)
Admission: RE | Disposition: A | Discharge status: Home / Self Care | Payer: Self-pay | Source: Home / Self Care | Attending: Vascular Surgery

## 2020-04-28 ENCOUNTER — Inpatient Hospital Stay (HOSPITAL_COMMUNITY)
Admission: RE | Admit: 2020-04-28 | Discharge: 2020-04-29 | DRG: 269 | Disposition: A | Discharge status: Home / Self Care | Payer: 59 | Attending: Vascular Surgery | Admitting: Vascular Surgery

## 2020-04-28 ENCOUNTER — Inpatient Hospital Stay (HOSPITAL_COMMUNITY): Payer: 59

## 2020-04-28 DIAGNOSIS — E785 Hyperlipidemia, unspecified: Secondary | ICD-10-CM | POA: Diagnosis present

## 2020-04-28 DIAGNOSIS — Z20822 Contact with and (suspected) exposure to covid-19: Secondary | ICD-10-CM | POA: Diagnosis present

## 2020-04-28 DIAGNOSIS — Z79899 Other long term (current) drug therapy: Secondary | ICD-10-CM | POA: Diagnosis not present

## 2020-04-28 DIAGNOSIS — M5126 Other intervertebral disc displacement, lumbar region: Secondary | ICD-10-CM | POA: Diagnosis present

## 2020-04-28 DIAGNOSIS — Z888 Allergy status to other drugs, medicaments and biological substances status: Secondary | ICD-10-CM | POA: Diagnosis not present

## 2020-04-28 DIAGNOSIS — Z83438 Family history of other disorder of lipoprotein metabolism and other lipidemia: Secondary | ICD-10-CM

## 2020-04-28 DIAGNOSIS — L409 Psoriasis, unspecified: Secondary | ICD-10-CM | POA: Diagnosis present

## 2020-04-28 DIAGNOSIS — Z8711 Personal history of peptic ulcer disease: Secondary | ICD-10-CM

## 2020-04-28 DIAGNOSIS — I10 Essential (primary) hypertension: Secondary | ICD-10-CM | POA: Diagnosis present

## 2020-04-28 DIAGNOSIS — I714 Abdominal aortic aneurysm, without rupture, unspecified: Secondary | ICD-10-CM | POA: Diagnosis present

## 2020-04-28 DIAGNOSIS — Z8679 Personal history of other diseases of the circulatory system: Secondary | ICD-10-CM

## 2020-04-28 DIAGNOSIS — F17210 Nicotine dependence, cigarettes, uncomplicated: Secondary | ICD-10-CM | POA: Diagnosis present

## 2020-04-28 DIAGNOSIS — K219 Gastro-esophageal reflux disease without esophagitis: Secondary | ICD-10-CM | POA: Diagnosis present

## 2020-04-28 HISTORY — PX: ULTRASOUND GUIDANCE FOR VASCULAR ACCESS: SHX6516

## 2020-04-28 HISTORY — PX: ABDOMINAL AORTIC ENDOVASCULAR STENT GRAFT: SHX5707

## 2020-04-28 LAB — CBC
HCT: 38.4 % — ABNORMAL LOW (ref 39.0–52.0)
Hemoglobin: 13.2 g/dL (ref 13.0–17.0)
MCH: 31.2 pg (ref 26.0–34.0)
MCHC: 34.4 g/dL (ref 30.0–36.0)
MCV: 90.8 fL (ref 80.0–100.0)
Platelets: 186 10*3/uL (ref 150–400)
RBC: 4.23 MIL/uL (ref 4.22–5.81)
RDW: 13 % (ref 11.5–15.5)
WBC: 6.2 10*3/uL (ref 4.0–10.5)
nRBC: 0 % (ref 0.0–0.2)

## 2020-04-28 LAB — BASIC METABOLIC PANEL
Anion gap: 6 (ref 5–15)
BUN: 14 mg/dL (ref 8–23)
CO2: 24 mmol/L (ref 22–32)
Calcium: 8.5 mg/dL — ABNORMAL LOW (ref 8.9–10.3)
Chloride: 105 mmol/L (ref 98–111)
Creatinine, Ser: 0.86 mg/dL (ref 0.61–1.24)
GFR, Estimated: >60 mL/min (ref >60)
Glucose, Bld: 108 mg/dL — ABNORMAL HIGH (ref 70–99)
Potassium: 4.1 mmol/L (ref 3.5–5.1)
Sodium: 135 mmol/L (ref 135–145)

## 2020-04-28 LAB — MAGNESIUM: Magnesium: 1.9 mg/dL (ref 1.7–2.4)

## 2020-04-28 LAB — PROTIME-INR
INR: 1.1 (ref 0.8–1.2)
Prothrombin Time: 13.3 seconds (ref 11.4–15.2)

## 2020-04-28 LAB — ABO/RH: ABO/RH(D): A POS

## 2020-04-28 LAB — APTT: aPTT: 29 seconds (ref 24–36)

## 2020-04-28 SURGERY — INSERTION, ENDOVASCULAR STENT GRAFT, AORTA, ABDOMINAL
Anesthesia: General | Site: Groin

## 2020-04-28 MED ORDER — CEFAZOLIN SODIUM-DEXTROSE 2-4 GM/100ML-% IV SOLN
2.0000 g | Freq: Three times a day (TID) | INTRAVENOUS | Status: AC
  Administered 2020-04-28 – 2020-04-29 (×2): 2 g via INTRAVENOUS
  Filled 2020-04-28 (×2): qty 100

## 2020-04-28 MED ORDER — HYDRALAZINE HCL 20 MG/ML IJ SOLN: 5.0000 mg | INTRAMUSCULAR | Status: DC | PRN

## 2020-04-28 MED ORDER — DOCUSATE SODIUM 100 MG PO CAPS
100.0000 mg | ORAL_CAPSULE | Freq: Every day | ORAL | Status: DC
  Filled 2020-04-28: qty 1

## 2020-04-28 MED ORDER — LACTATED RINGERS IV SOLN: INTRAVENOUS | Status: DC | PRN

## 2020-04-28 MED ORDER — ACETAMINOPHEN 650 MG RE SUPP: 325.0000 mg | RECTAL | Status: DC | PRN

## 2020-04-28 MED ORDER — MIDAZOLAM HCL 2 MG/2ML IJ SOLN
INTRAMUSCULAR | Status: AC
  Filled 2020-04-28: qty 2

## 2020-04-28 MED ORDER — FENTANYL CITRATE (PF) 100 MCG/2ML IJ SOLN
INTRAMUSCULAR | Status: DC | PRN
Start: 2020-04-28 — End: 2020-04-28
  Administered 2020-04-28: 50 ug via INTRAVENOUS
  Administered 2020-04-28: 100 ug via INTRAVENOUS

## 2020-04-28 MED ORDER — PROMETHAZINE HCL 25 MG/ML IJ SOLN: 6.2500 mg | INTRAMUSCULAR | Status: DC | PRN

## 2020-04-28 MED ORDER — ALUM & MAG HYDROXIDE-SIMETH 200-200-20 MG/5ML PO SUSP: 15.0000 mL | ORAL | Status: DC | PRN

## 2020-04-28 MED ORDER — LABETALOL HCL 5 MG/ML IV SOLN: 10.0000 mg | INTRAVENOUS | Status: DC | PRN

## 2020-04-28 MED ORDER — CHLORHEXIDINE GLUCONATE 0.12 % MT SOLN
15.0000 mL | Freq: Once | OROMUCOSAL | Status: AC
  Administered 2020-04-28: 15 mL via OROMUCOSAL
  Filled 2020-04-28: qty 15

## 2020-04-28 MED ORDER — ROCURONIUM BROMIDE 10 MG/ML (PF) SYRINGE
PREFILLED_SYRINGE | INTRAVENOUS | Status: DC | PRN
  Administered 2020-04-28: 60 mg via INTRAVENOUS
  Administered 2020-04-28: 20 mg via INTRAVENOUS

## 2020-04-28 MED ORDER — HEPARIN SODIUM (PORCINE) 1000 UNIT/ML IJ SOLN
INTRAMUSCULAR | Status: DC | PRN
  Administered 2020-04-28: 7000 [IU] via INTRAVENOUS

## 2020-04-28 MED ORDER — ASPIRIN EC 81 MG PO TBEC
81.0000 mg | DELAYED_RELEASE_TABLET | Freq: Every day | ORAL | Status: DC
  Administered 2020-04-28 – 2020-04-29 (×2): 81 mg via ORAL
  Filled 2020-04-28 (×2): qty 1

## 2020-04-28 MED ORDER — LIDOCAINE 2% (20 MG/ML) 5 ML SYRINGE
INTRAMUSCULAR | Status: DC | PRN
  Administered 2020-04-28: 60 mg via INTRAVENOUS

## 2020-04-28 MED ORDER — LACTATED RINGERS IV SOLN: INTRAVENOUS | Status: DC

## 2020-04-28 MED ORDER — EPHEDRINE SULFATE 50 MG/ML IJ SOLN
INTRAMUSCULAR | Status: DC | PRN
  Administered 2020-04-28: 10 mg via INTRAVENOUS

## 2020-04-28 MED ORDER — ROSUVASTATIN CALCIUM 20 MG PO TABS
20.0000 mg | ORAL_TABLET | Freq: Every day | ORAL | Status: DC
  Administered 2020-04-29: 20 mg via ORAL
  Filled 2020-04-28: qty 1

## 2020-04-28 MED ORDER — CEFAZOLIN SODIUM-DEXTROSE 2-4 GM/100ML-% IV SOLN
2.0000 g | INTRAVENOUS | Status: AC
  Administered 2020-04-28: 2 g via INTRAVENOUS
  Filled 2020-04-28: qty 100

## 2020-04-28 MED ORDER — PHENYLEPHRINE HCL-NACL 10-0.9 MG/250ML-% IV SOLN
INTRAVENOUS | Status: DC | PRN
  Administered 2020-04-28: 50 ug/min via INTRAVENOUS

## 2020-04-28 MED ORDER — PROTAMINE SULFATE 10 MG/ML IV SOLN
INTRAVENOUS | Status: DC | PRN
  Administered 2020-04-28 (×5): 10 mg via INTRAVENOUS

## 2020-04-28 MED ORDER — ONDANSETRON HCL 4 MG/2ML IJ SOLN: 4.0000 mg | Freq: Four times a day (QID) | INTRAMUSCULAR | Status: DC | PRN

## 2020-04-28 MED ORDER — ACETAMINOPHEN 325 MG PO TABS: 325.0000 mg | ORAL_TABLET | ORAL | Status: DC | PRN

## 2020-04-28 MED ORDER — HYDROMORPHONE HCL 1 MG/ML IJ SOLN
0.2500 mg | INTRAMUSCULAR | Status: DC | PRN
  Administered 2020-04-28 (×2): 0.5 mg via INTRAVENOUS

## 2020-04-28 MED ORDER — SODIUM CHLORIDE 0.9 % IV SOLN: INTRAVENOUS | Status: DC

## 2020-04-28 MED ORDER — SODIUM CHLORIDE 0.9 % IV SOLN
INTRAVENOUS | Status: AC
  Filled 2020-04-28: qty 1.2

## 2020-04-28 MED ORDER — CHLORHEXIDINE GLUCONATE CLOTH 2 % EX PADS: 6.0000 | MEDICATED_PAD | Freq: Once | CUTANEOUS | Status: DC

## 2020-04-28 MED ORDER — OXYCODONE HCL 5 MG/5ML PO SOLN: 5.0000 mg | Freq: Once | ORAL | Status: DC | PRN

## 2020-04-28 MED ORDER — METOPROLOL TARTRATE 5 MG/5ML IV SOLN: 2.0000 mg | INTRAVENOUS | Status: DC | PRN

## 2020-04-28 MED ORDER — SUGAMMADEX SODIUM 200 MG/2ML IV SOLN
INTRAVENOUS | Status: DC | PRN
  Administered 2020-04-28: 200 mg via INTRAVENOUS

## 2020-04-28 MED ORDER — OXYCODONE HCL 5 MG PO TABS: 5.0000 mg | ORAL_TABLET | Freq: Once | ORAL | Status: DC | PRN

## 2020-04-28 MED ORDER — POLYETHYLENE GLYCOL 3350 17 G PO PACK: 17.0000 g | PACK | Freq: Every day | ORAL | Status: DC | PRN

## 2020-04-28 MED ORDER — HYDROMORPHONE HCL 1 MG/ML IJ SOLN
INTRAMUSCULAR | Status: AC
  Filled 2020-04-28: qty 1

## 2020-04-28 MED ORDER — MIDAZOLAM HCL 5 MG/5ML IJ SOLN
INTRAMUSCULAR | Status: DC | PRN
  Administered 2020-04-28: 2 mg via INTRAVENOUS

## 2020-04-28 MED ORDER — 0.9 % SODIUM CHLORIDE (POUR BTL) OPTIME
TOPICAL | Status: DC | PRN
  Administered 2020-04-28: 1000 mL

## 2020-04-28 MED ORDER — SODIUM CHLORIDE 0.9 % IV SOLN: 500.0000 mL | Freq: Once | INTRAVENOUS | Status: DC | PRN

## 2020-04-28 MED ORDER — POTASSIUM CHLORIDE CRYS ER 20 MEQ PO TBCR: 20.0000 meq | EXTENDED_RELEASE_TABLET | Freq: Every day | ORAL | Status: DC | PRN

## 2020-04-28 MED ORDER — MAGNESIUM SULFATE 2 GM/50ML IV SOLN: 2.0000 g | Freq: Every day | INTRAVENOUS | Status: DC | PRN

## 2020-04-28 MED ORDER — CIPROFLOXACIN HCL 500 MG PO TABS
500.0000 mg | ORAL_TABLET | Freq: Two times a day (BID) | ORAL | Status: DC
  Administered 2020-04-28 – 2020-04-29 (×2): 500 mg via ORAL
  Filled 2020-04-28 (×2): qty 1

## 2020-04-28 MED ORDER — PROPOFOL 10 MG/ML IV BOLUS
INTRAVENOUS | Status: AC
  Filled 2020-04-28: qty 40

## 2020-04-28 MED ORDER — AMLODIPINE BESYLATE 5 MG PO TABS
5.0000 mg | ORAL_TABLET | Freq: Every day | ORAL | Status: DC
  Administered 2020-04-28 – 2020-04-29 (×2): 5 mg via ORAL
  Filled 2020-04-28 (×2): qty 1

## 2020-04-28 MED ORDER — PANTOPRAZOLE SODIUM 40 MG PO TBEC
40.0000 mg | DELAYED_RELEASE_TABLET | Freq: Every day | ORAL | Status: DC
  Filled 2020-04-28: qty 1

## 2020-04-28 MED ORDER — IODIXANOL 320 MG/ML IV SOLN
INTRAVENOUS | Status: DC | PRN
  Administered 2020-04-28: 65 mL

## 2020-04-28 MED ORDER — MORPHINE SULFATE (PF) 2 MG/ML IV SOLN: 2.0000 mg | INTRAVENOUS | Status: DC | PRN

## 2020-04-28 MED ORDER — PHENOL 1.4 % MT LIQD: 1.0000 | OROMUCOSAL | Status: DC | PRN

## 2020-04-28 MED ORDER — SODIUM CHLORIDE 0.9 % IV SOLN
INTRAVENOUS | Status: DC | PRN
  Administered 2020-04-28: 500 mL

## 2020-04-28 MED ORDER — PHENYLEPHRINE HCL (PRESSORS) 10 MG/ML IV SOLN
INTRAVENOUS | Status: DC | PRN
  Administered 2020-04-28: 100 ug via INTRAVENOUS

## 2020-04-28 MED ORDER — GUAIFENESIN-DM 100-10 MG/5ML PO SYRP: 15.0000 mL | ORAL_SOLUTION | ORAL | Status: DC | PRN

## 2020-04-28 MED ORDER — FENTANYL CITRATE (PF) 250 MCG/5ML IJ SOLN
INTRAMUSCULAR | Status: AC
  Filled 2020-04-28: qty 5

## 2020-04-28 MED ORDER — OXYCODONE-ACETAMINOPHEN 5-325 MG PO TABS
1.0000 | ORAL_TABLET | ORAL | Status: DC | PRN
  Administered 2020-04-28: 2 via ORAL
  Filled 2020-04-28: qty 2

## 2020-04-28 MED ORDER — ACETAMINOPHEN 10 MG/ML IV SOLN: 1000.0000 mg | Freq: Once | INTRAVENOUS | Status: DC | PRN

## 2020-04-28 MED ORDER — ORAL CARE MOUTH RINSE: 15.0000 mL | Freq: Once | OROMUCOSAL | Status: AC

## 2020-04-28 MED ORDER — BISACODYL 10 MG RE SUPP: 10.0000 mg | Freq: Every day | RECTAL | Status: DC | PRN

## 2020-04-28 MED ORDER — HEPARIN SODIUM (PORCINE) 5000 UNIT/ML IJ SOLN: 5000.0000 [IU] | Freq: Three times a day (TID) | INTRAMUSCULAR | Status: DC

## 2020-04-28 MED ORDER — PROPOFOL 10 MG/ML IV BOLUS
INTRAVENOUS | Status: DC | PRN
  Administered 2020-04-28: 200 mg via INTRAVENOUS

## 2020-04-28 SURGICAL SUPPLY — 49 items
ADH SKN CLS APL DERMABOND .7 (GAUZE/BANDAGES/DRESSINGS) ×2
CANISTER SUCT 3000ML PPV (MISCELLANEOUS) ×3 IMPLANT
CATH BEACON 5.038 65CM KMP-01 (CATHETERS) ×3 IMPLANT
CATH OMNI FLUSH .035X70CM (CATHETERS) ×3 IMPLANT
CLIP LIGATING EXTRA MED SLVR (CLIP) IMPLANT
CLIP LIGATING EXTRA SM BLUE (MISCELLANEOUS) IMPLANT
COVER PROBE W GEL 5X96 (DRAPES) ×3 IMPLANT
COVER WAND RF STERILE (DRAPES) ×3 IMPLANT
DERMABOND ADVANCED (GAUZE/BANDAGES/DRESSINGS) ×1
DERMABOND ADVANCED .7 DNX12 (GAUZE/BANDAGES/DRESSINGS) ×2 IMPLANT
DEVICE CLOSURE PERCLS PRGLD 6F (VASCULAR PRODUCTS) IMPLANT
DRSG TEGADERM 2-3/8X2-3/4 SM (GAUZE/BANDAGES/DRESSINGS) ×6 IMPLANT
ELECT REM PT RETURN 9FT ADLT (ELECTROSURGICAL) ×3
ELECTRODE REM PT RTRN 9FT ADLT (ELECTROSURGICAL) ×4 IMPLANT
EXCLDR TRNK 28.5X14.5X12 16F (Endovascular Graft) ×3 IMPLANT
EXCLUDER TNK 28.5X14.5X12 16F (Endovascular Graft) IMPLANT
GLOVE SS BIOGEL STRL SZ 7.5 (GLOVE) ×2 IMPLANT
GLOVE SUPERSENSE BIOGEL SZ 7.5 (GLOVE) ×1
GOWN STRL REUS W/ TWL LRG LVL3 (GOWN DISPOSABLE) ×6 IMPLANT
GOWN STRL REUS W/TWL LRG LVL3 (GOWN DISPOSABLE) ×9
GRAFT BALLN CATH 65CM (STENTS) ×2 IMPLANT
KIT BASIN OR (CUSTOM PROCEDURE TRAY) ×3 IMPLANT
KIT TURNOVER KIT B (KITS) ×3 IMPLANT
LEG CONTRALATERAL 16X12X14 (Vascular Products) ×6 IMPLANT
NDL PERC 18GX7CM (NEEDLE) ×2 IMPLANT
NEEDLE PERC 18GX7CM (NEEDLE) ×3 IMPLANT
NS IRRIG 1000ML POUR BTL (IV SOLUTION) ×3 IMPLANT
PACK ENDOVASCULAR (PACKS) ×3 IMPLANT
PAD ARMBOARD 7.5X6 YLW CONV (MISCELLANEOUS) ×6 IMPLANT
PERCLOSE PROGLIDE 6F (VASCULAR PRODUCTS) ×12
SHEATH BRITE TIP 8FR 23CM (SHEATH) ×3 IMPLANT
SHEATH PINNACLE 8F 10CM (SHEATH) ×3 IMPLANT
STENT GRAFT BALLN CATH 65CM (STENTS) ×3
STENT GRAFT CONTRALAT 16X12X14 (Vascular Products) IMPLANT
STOPCOCK MORSE 400PSI 3WAY (MISCELLANEOUS) ×3 IMPLANT
SUT ETHILON 3 0 PS 1 (SUTURE) IMPLANT
SUT PROLENE 5 0 C 1 24 (SUTURE) IMPLANT
SUT VIC AB 2-0 CTX 36 (SUTURE) IMPLANT
SUT VIC AB 3-0 SH 18 (SUTURE) IMPLANT
SUT VIC AB 3-0 SH 27 (SUTURE)
SUT VIC AB 3-0 SH 27X BRD (SUTURE) IMPLANT
SUT VICRYL 4-0 PS2 18IN ABS (SUTURE) ×6 IMPLANT
SYR 20ML LL LF (SYRINGE) ×3 IMPLANT
TOWEL GREEN STERILE (TOWEL DISPOSABLE) ×3 IMPLANT
TRAY FOLEY MTR SLVR 14FR STAT (SET/KITS/TRAYS/PACK) ×1 IMPLANT
TRAY FOLEY MTR SLVR 16FR STAT (SET/KITS/TRAYS/PACK) ×3 IMPLANT
TUBING HIGH PRESSURE 120CM (CONNECTOR) ×3 IMPLANT
WIRE AMPLATZ SS-J .035X180CM (WIRE) ×6 IMPLANT
WIRE BENTSON .035X145CM (WIRE) ×6 IMPLANT

## 2020-04-28 NOTE — Interval H&P Note (Signed)
History and Physical Interval Note:  04/28/2020 6:52 AM  Billy Mccormick  has presented today for surgery, with the diagnosis of AAA.  The various methods of treatment have been discussed with the patient and family. After consideration of risks, benefits and other options for treatment, the patient has consented to  Procedure(s): ABDOMINAL AORTIC ENDOVASCULAR STENT GRAFT (N/A) as a surgical intervention.  The patient's history has been reviewed, patient examined, no change in status, stable for surgery.  I have reviewed the patient's chart and labs.  Questions were answered to the patient's satisfaction.     Curt Jews

## 2020-04-28 NOTE — Progress Notes (Signed)
  Day of Surgery Note    Subjective:  Seen in PACU.  Complaining of pain in the "small of my back".  Denies CP, abdominal or LE pain.  Asking to elevate head of bed and to roll side to side. No nausea.   Vitals:   04/28/20 1315 04/28/20 1330  BP:  129/84  Pulse: 66 72  Resp: 12 12  Temp:    SpO2: 97% 97%    Incisions:   R and L groin puncture sites without bleeding or hematoma Extremities:  Moves all well. Bounding PT pulses bilaterally. Cardiac:  RRR Lungs:  CTAB Abdomen:  Soft, ND, NT, +BS Neuro: A and O times 4 in NAD   Assessment/Plan:  This is a 64 y.o. male who is s/p  EVAR. VSS. To 4E shortly per RN. Monitor back pain. He has approximately 30 minutes to continue supine position  -Risa Grill, PA-C 04/28/2020 1:45 PM [phone]

## 2020-04-28 NOTE — Anesthesia Procedure Notes (Signed)
Arterial Line Insertion Performed by: Eligha Bridegroom, CRNA, CRNA  Preanesthetic checklist: patient identified, IV checked, surgical consent, monitors and equipment checked, pre-op evaluation and timeout performed radial was placed Catheter size: 20 G Hand hygiene performed  and maximum sterile barriers used   Attempts: 1 Procedure performed without using ultrasound guided technique. Following insertion, dressing applied and Biopatch. Post procedure assessment: normal  Patient tolerated the procedure well with no immediate complications.

## 2020-04-28 NOTE — Discharge Instructions (Signed)
  [facility]   Discharge Instructions  Endovascular Aortic Aneurysm Repair  Please refer to the following instructions for your post-procedure care. Your surgeon or Physician Assistant will discuss any changes with you.  Activity  You are encouraged to walk as much as you can. You can slowly return to normal activities but must avoid strenuous activity and heavy lifting until your doctor tells you it's OK. Avoid activities such as vacuuming or swinging a gold club. It is normal to feel tired for several weeks after your surgery. Do not drive until your doctor gives the OK and you are no longer taking prescription pain medications. It is also normal to have difficulty with sleep habits, eating, and bowel movements after surgery. These will go away with time.  Bathing/Showering  Shower daily after you go home.  Do not soak in a bathtub, hot tub, or swim until the incision heals completely.  If you have incisions in your groin, wash the groin wounds with soap and water daily and pat dry. (No tub bath-only shower)  Then put a dry gauze or washcloth there to keep this area dry to help prevent wound infection daily and as needed.  Do not use Vaseline or neosporin on your incisions.  Only use soap and water on your incisions and then protect and keep dry.  Incision Care  Shower every day. Clean your incision with mild soap and water. Pat the area dry with a clean towel. You do not need a bandage unless otherwise instructed. Do not apply any ointments or creams to your incision. If you clothing is irritating, you may cover your incision with a dry gauze pad.  Diet  Resume your normal diet. There are no special food restrictions following this procedure. A low fat/low cholesterol diet is recommended for all patients with vascular disease. In order to heal from your surgery, it is CRITICAL to get adequate nutrition. Your body requires vitamins, minerals, and protein.  Vegetables are the best source of vitamins and minerals. Vegetables also provide the perfect balance of protein. Processed food has little nutritional value, so try to avoid this.  Medications  Resume taking all of your medications unless your doctor or nurse practitioner tells you not to. If your incision is causing pain, you may take over-the-counter pain relievers such as acetaminophen (Tylenol). If you were prescribed a stronger pain medication, please be aware these medications can cause nausea and constipation. Prevent nausea by taking the medication with a snack or meal. Avoid constipation by drinking plenty of fluids and eating foods with a high amount of fiber, such as fruits, vegetables, and grains.  Do not take Tylenol if you are taking prescription pain medications.   Follow up  Our office will schedule a follow-up appointment with a CT scan 3-4 weeks after your surgery.  Please call us immediately for any of the following conditions  Severe or worsening pain in your legs or feet or in your abdomen back or chest. Increased pain, redness, drainage (pus) from your incision site. Increased abdominal pain, bloating, nausea, vomiting or persistent diarrhea. Fever of 101 degrees or higher. Swelling in your leg (s),  Reduce your risk of vascular disease  Stop smoking. If you would like help call [facility] at 1-800-QUIT-NOW (1-800-784-8669) or [place] at [phone]. Manage your cholesterol Maintain a desired weight Control your diabetes Keep your blood pressure down  If you have questions, please call the office at [phone].  

## 2020-04-28 NOTE — Anesthesia Procedure Notes (Signed)
Procedure Name: Intubation Date/Time: 04/28/2020 8:52 AM Performed by: Eligha Bridegroom, CRNA Pre-anesthesia Checklist: Patient identified, Emergency Drugs available, Suction available, Patient being monitored and Timeout performed Patient Re-evaluated:Patient Re-evaluated prior to induction Oxygen Delivery Method: Circle system utilized Preoxygenation: Pre-oxygenation with 100% oxygen Induction Type: IV induction Ventilation: Mask ventilation without difficulty Grade View: Grade III Tube type: Oral Tube size: 7.5 mm Number of attempts: 3 Airway Equipment and Method: Stylet and Video-laryngoscopy Placement Confirmation: ETT inserted through vocal cords under direct vision,  positive ETCO2 and breath sounds checked- equal and bilateral Secured at: 22 cm Tube secured with: Tape Dental Injury: Teeth and Oropharynx as per pre-operative assessment

## 2020-04-28 NOTE — Op Note (Signed)
OPERATIVE REPORT  DATE OF SURGERY: 04/28/2020  PATIENT: Billy Mccormick, 64 y.o. male MRN: [id number]  DOB: 10/25/1955  PRE-OPERATIVE DIAGNOSIS: Abdominal aortic aneurysm  POST-OPERATIVE DIAGNOSIS:  Same  PROCEDURE: Simeon Craft excluder stent graft repair abdominal arctic aneurysm  SURGEON:  Curt Jews, M.D.  PHYSICIAN ASSISTANT: Rhyne PAC  The assistant was needed for exposure and to expedite the case  ANESTHESIA: General  EBL: per anesthesia record  Total I/O In: 1600 [I.V.:1600] Out: 350 [Urine:350]  BLOOD ADMINISTERED: none  DRAINS: none  SPECIMEN: none  COUNTS CORRECT:  YES  PATIENT DISPOSITION:  PACU - hemodynamically stable  PROCEDURE DETAILS: Patient was taken operating placed supine position with area of the abdomen both groins prepped draped you sterile fashion.  SonoSite ultrasound was used to access the common femoral arteries bilaterally with 18-gauge needle.  Bentson wire was passed centrally and this was confirmed with fluoroscopy.  And a dilator was passed over the Bentson wire and 2 separate Perclose devices were positioned at 10:00 and 2:00 over each wire and partially deployed.  These were used for closure of the arterial puncture at the end of the procedure.  The 8 French sheath was passed over the guidewire and the Bentson wires were exchanged through catheters for Amplatz superstiff wires.  33 French sheath was placed in the left femoral artery and an 18 French sheath was placed in the right femoral artery.  The patient was given 7000 units of intravenous heparin prior to placement of a large sheath.  The main body device was positioned through the right groin.  This was an 18 x 14 x 12 cm device.  A pigtail catheter was positioned to the left sheath to the suprarenal area and the main body was positioned at the level of the renal arteries in a reverse limb position.  Injection revealed the level of the renal arteries.  The main body was deployed just below  the takeoff of the right renal artery which was the lowest renal artery.  Repeat injection to the pigtail catheter confirmed excellent placement.  Next a Bentson wire was placed in a buddy wire technique through the left groin and using a KMP catheter the contralateral gate was cannulated.  A pigtail catheter was positioned over the wire and rotated within the main body to confirm that this was indeed intra graft.  A hand-injection through the left femoral sheath revealed a takeoff of the hypogastric artery.  The left limb was a 12 x 14 cm limb and it was position for the appropriate overlap in the main body and landed short of the hypogastric artery takeoff.  The main body then was completely deployed and a extension was placed.  Retrograde injection through the right groin revealed the takeoff of the right hypogastric artery.  The right limb extension was a 12 x 14 and this also landed above the hypogastric artery takeoff.  The proximal and distal attachments and both junctions were dilated with a MO B balloon.  The pigtail catheter was again positioned in the suprarenal aorta and a completion arteriogram revealed excellent positioning with no evidence of endoleak.  The sheaths were then removed and the prior placed Perclose devices were secured giving excellent hemostasis.  The punctures in both groins were closed with 4-0 Vicryl suture.  Patient had 2+ posterior tibial pulses bilaterally.  He was transferred to the recovery room in stable condition.  Patient was given 50 mg of protamine to reverse the heparin   Sherren Mocha  Katina Dung, M.D., [facility] 04/28/2020 11:16 AM

## 2020-04-28 NOTE — Transfer of Care (Signed)
Immediate Anesthesia Transfer of Care Note  Patient: Billy Mccormick  Procedure(s) Performed: ABDOMINAL AORTIC ENDOVASCULAR STENT GRAFT (N/A ) ULTRASOUND GUIDANCE FOR VASCULAR ACCESS (Bilateral Groin)  Patient Location: PACU  Anesthesia Type:General  Level of Consciousness: awake, alert  and oriented  Airway & Oxygen Therapy: Patient Spontanous Breathing and Patient connected to nasal cannula oxygen  Post-op Assessment: Report given to RN and Post -op Vital signs reviewed and stable  Post vital signs: Reviewed and stable  Last Vitals:  Vitals Value Taken Time  BP 141/84 04/28/20 1044  Temp    Pulse 82 04/28/20 1048  Resp 13 04/28/20 1048  SpO2 93 % 04/28/20 1048  Vitals shown include unvalidated device data.  Last Pain:  Vitals:   04/28/20 0659  TempSrc:   PainSc: 0-No pain         Complications: No complications documented.

## 2020-04-28 NOTE — Progress Notes (Signed)
Patient to room 4E25 from PACU. Vital signs obtained. Monitor on CCMD notified.  Bilateral groins level 0. CHG bath completed. Alert and oriented to room and call light. Call bell within reach.  Era Bumpers, RN

## 2020-04-29 ENCOUNTER — Encounter (HOSPITAL_COMMUNITY): Payer: Self-pay | Admitting: Vascular Surgery

## 2020-04-29 LAB — CBC
HCT: 38.1 % — ABNORMAL LOW (ref 39.0–52.0)
Hemoglobin: 12.7 g/dL — ABNORMAL LOW (ref 13.0–17.0)
MCH: 30.2 pg (ref 26.0–34.0)
MCHC: 33.3 g/dL (ref 30.0–36.0)
MCV: 90.5 fL (ref 80.0–100.0)
Platelets: 185 10*3/uL (ref 150–400)
RBC: 4.21 MIL/uL — ABNORMAL LOW (ref 4.22–5.81)
RDW: 12.9 % (ref 11.5–15.5)
WBC: 7.1 10*3/uL (ref 4.0–10.5)
nRBC: 0 % (ref 0.0–0.2)

## 2020-04-29 LAB — BASIC METABOLIC PANEL
Anion gap: 10 (ref 5–15)
BUN: 10 mg/dL (ref 8–23)
CO2: 23 mmol/L (ref 22–32)
Calcium: 8.8 mg/dL — ABNORMAL LOW (ref 8.9–10.3)
Chloride: 102 mmol/L (ref 98–111)
Creatinine, Ser: 0.86 mg/dL (ref 0.61–1.24)
GFR, Estimated: >60 mL/min (ref >60)
Glucose, Bld: 106 mg/dL — ABNORMAL HIGH (ref 70–99)
Potassium: 4.1 mmol/L (ref 3.5–5.1)
Sodium: 135 mmol/L (ref 135–145)

## 2020-04-29 MED ORDER — OXYCODONE-ACETAMINOPHEN 5-325 MG PO TABS: 1.0000 | ORAL_TABLET | ORAL | 0 refills | Status: DC | PRN

## 2020-04-29 NOTE — Plan of Care (Signed)

## 2020-04-29 NOTE — Progress Notes (Signed)
Patient ID: Billy Mccormick, male   DOB: 02-Aug-1955, 64 y.o.   MRN: [id number]  Progress Note    04/29/2020 6:40 AM 1 Day Post-Op  Subjective: Comfortable.  Foley out.  No abdominal or groin pain   Vitals:   04/28/20 2304 04/29/20 0400  BP: 123/85 114/75  Pulse: 70 67  Resp: 16 13  Temp: 98.7 F (37.1 C) 98.7 F (37.1 C)  SpO2: 95% 95%   Physical Exam: Abdomen soft.  Aorta not pulsatile.  Femoral puncture site without hematoma.  2+ posterior tibial pulses bilaterally  CBC    Component Value Date/Time   WBC 7.1 04/29/2020 0552   RBC 4.21 (L) 04/29/2020 0552   HGB 12.7 (L) 04/29/2020 0552   HGB 16.0 03/17/2020 1043   HCT 38.1 (L) 04/29/2020 0552   HCT 45.8 03/17/2020 1043   PLT 185 04/29/2020 0552   PLT 267 03/17/2020 1043   MCV 90.5 04/29/2020 0552   MCV 88 03/17/2020 1043   MCH 30.2 04/29/2020 0552   MCHC 33.3 04/29/2020 0552   RDW 12.9 04/29/2020 0552   RDW 13.0 03/17/2020 1043   LYMPHSABS 2.4 03/17/2020 1043   EOSABS 0.2 03/17/2020 1043   BASOSABS 0.1 03/17/2020 1043    BMET    Component Value Date/Time   NA 135 04/28/2020 1102   NA 140 03/17/2020 1043   K 4.1 04/28/2020 1102   CL 105 04/28/2020 1102   CO2 24 04/28/2020 1102   GLUCOSE 108 (H) 04/28/2020 1102   BUN 14 04/28/2020 1102   BUN 10 03/17/2020 1043   CREATININE 0.86 04/28/2020 1102   CREATININE 0.86 09/11/2015 0847   CALCIUM 8.5 (L) 04/28/2020 1102   GFRNONAA >60 04/28/2020 1102   GFRAA 96 03/17/2020 1043    INR    Component Value Date/Time   INR 1.1 04/28/2020 1102     Intake/Output Summary (Last 24 hours) at 04/29/2020 0640 Last data filed at 04/29/2020 0423 Gross per 24 hour  Intake 2955 ml  Output 3050 ml  Net -95 ml     Assessment/Plan:  64 y.o. male stable postop day 1 from stent graft repair abdominal aortic aneurysm.  For discharge home today.  Follow-up with me in 1 month with CT angio abdomen and pelvis     Rosetta Posner, MD [facility] [phone] 04/29/2020 6:40 AM

## 2020-04-29 NOTE — Anesthesia Postprocedure Evaluation (Signed)
Anesthesia Post Note  Patient: Billy Mccormick  Procedure(s) Performed: ABDOMINAL AORTIC ENDOVASCULAR STENT GRAFT (N/A ) ULTRASOUND GUIDANCE FOR VASCULAR ACCESS (Bilateral Groin)     Patient location during evaluation: PACU Anesthesia Type: General Level of consciousness: awake Pain management: pain level controlled Vital Signs Assessment: post-procedure vital signs reviewed and stable Respiratory status: spontaneous breathing, nonlabored ventilation, respiratory function stable and patient connected to nasal cannula oxygen Cardiovascular status: blood pressure returned to baseline and stable Postop Assessment: no apparent nausea or vomiting Anesthetic complications: no   No complications documented.  Last Vitals:  Vitals:   04/29/20 0800 04/29/20 0813  BP:  126/84  Pulse:  93  Resp:  14  Temp:  37 C  SpO2: 98% 98%    Last Pain:  Vitals:   04/29/20 0813  TempSrc: Oral  PainSc:                  Karyl Kinnier Simcha Speir

## 2020-04-29 NOTE — Discharge Summary (Signed)
EVAR Discharge Summary   Billy Mccormick [birth year] 64 y.o. male  MRN: [id number]  Admission Date: 04/28/2020  Discharge Date: 04/29/2020  Physician: Rosetta Posner, MD  Admission Diagnosis: AAA (abdominal aortic aneurysm) [facility]) [I71.4]   HPI:   This is a 64 y.o. male here today for follow-up.  Was initially seen by myself in January 2016 with ectasia of his infrarenal aorta.  At that time ultrasound revealed between 3 and 3-1/2 cm enlargement of his aorta.  He has been followed in our office with serial exams with continued growth throughout this time.  Most recently he had a CT showing regression to nearly 5-1/2 cm and he is undergoing outpatient CT scan and is here today for further discussion of this.  He is in excellent health.  He has no history of cardiac disease.  He has no history of peripheral vascular occlusive disease or stroke.  He has no claudication symptoms. I had a very long discussion with the patient regarding options for open versus stent graft repair of abdominal aortic aneurysm.  With his continued growth and now 5.4 cm maximal diameter I would recommend elective repair.  He has no cardiac history.  We will have him see a cardiologist here in [place] for preoperative clearance.  We will then proceed with repair.   Hospital Course:  The patient was admitted to the hospital and taken to the operating room on 04/28/2020 and underwent:Gore excluder stent graft repair of abdominal aortic aneurysm.  The pt tolerated the procedure well and was transported to the PACU in good condition. In PACU patient did overall well with some lower back discomfort but hemodynamically stable with well perfused extremities. He was later transported to 4E in stable condition  By POD 1, patient did well over night. Foley was removed. He was without abdominal pain, back pain or groin pain. Extremities well perfused with palpable pulses bilaterally.  The remainder of the hospital  course consisted of increasing mobilization and increasing intake of solids without difficulty.  He remained stable for discharge home. He will continue all home medications as prescribed. PDMP was reviewed and patient was sent post operative pain medication to his pharmacy. He will follow up with Dr. Donnetta Hutching in 1 month with CT Angio Abd/pelvis.   CBC    Component Value Date/Time   WBC 7.1 04/29/2020 0552   RBC 4.21 (L) 04/29/2020 0552   HGB 12.7 (L) 04/29/2020 0552   HGB 16.0 03/17/2020 1043   HCT 38.1 (L) 04/29/2020 0552   HCT 45.8 03/17/2020 1043   PLT 185 04/29/2020 0552   PLT 267 03/17/2020 1043   MCV 90.5 04/29/2020 0552   MCV 88 03/17/2020 1043   MCH 30.2 04/29/2020 0552   MCHC 33.3 04/29/2020 0552   RDW 12.9 04/29/2020 0552   RDW 13.0 03/17/2020 1043   LYMPHSABS 2.4 03/17/2020 1043   EOSABS 0.2 03/17/2020 1043   BASOSABS 0.1 03/17/2020 1043    BMET    Component Value Date/Time   NA 135 04/29/2020 0552   NA 140 03/17/2020 1043   K 4.1 04/29/2020 0552   CL 102 04/29/2020 0552   CO2 23 04/29/2020 0552   GLUCOSE 106 (H) 04/29/2020 0552   BUN 10 04/29/2020 0552   BUN 10 03/17/2020 1043   CREATININE 0.86 04/29/2020 0552   CREATININE 0.86 09/11/2015 0847   CALCIUM 8.8 (L) 04/29/2020 0552   GFRNONAA >60 04/29/2020 0552   GFRAA 96 03/17/2020 1043  Discharge Instructions    Discharge patient   Complete by: As directed    Discharge disposition: 01-Home or Self Care   Discharge patient date: 04/29/2020      Discharge Diagnosis:  AAA (abdominal aortic aneurysm) [facility]) [I71.4]  Secondary Diagnosis: Patient Active Problem List   Diagnosis Date Noted  . AAA (abdominal aortic aneurysm) ([place]) 04/28/2020  . Smoking trying to quit 03/31/2020  . Hyperlipidemia with target LDL less than 100 10/04/2016  . Essential hypertension 10/02/2015  . Abdominal aortic aneurysm ([place]) 06/22/2014  . Right-sided low back pain with right-sided sciatica 06/22/2014  . Mucosal  abnormality of stomach   . Gastric ulcer 04/27/2014  . GERD (gastroesophageal reflux disease) 04/27/2014  . Esophageal dysphagia 11/11/2013  . Abdominal pain, epigastric 11/11/2013  . Back pain 01/15/2013  . Meralgia paresthetica of right side 01/15/2013   Past Medical History:  Diagnosis Date  . Degenerative cervical disc   . Essential hypertension 10/02/2015  . Hyperlipidemia   . IFG (impaired fasting glucose)   . Lumbar herniated disc 2015   L4,L5  . Psoriasis      Allergies as of 04/29/2020      Reactions   Prednisone Anxiety      Medication List    TAKE these medications   amLODipine 5 MG tablet Commonly known as: NORVASC TAKE 1 TABLET(5 MG) BY MOUTH DAILY What changed: See the new instructions.   aspirin EC 81 MG tablet Take 81 mg by mouth daily. Swallow whole.   ciprofloxacin 500 MG tablet Commonly known as: CIPRO Take 1 tablet (500 mg total) by mouth 2 (two) times daily for 7 days.   clobetasol cream 0.05 % Commonly known as: TEMOVATE Apply bid to affected area   enalapril 10 MG tablet Commonly known as: VASOTEC Take 1 tablet (10 mg total) by mouth 2 (two) times daily. TAKE 1 TABLET(5 MG) BY MOUTH DAILY What changed:   when to take this  additional instructions   oxyCODONE-acetaminophen 5-325 MG tablet Commonly known as: Percocet Take 1 tablet by mouth every 4 (four) hours as needed for severe pain.   pantoprazole 40 MG tablet Commonly known as: PROTONIX TAKE 1 TABLET(40 MG) BY MOUTH DAILY What changed: See the new instructions.   rosuvastatin 20 MG tablet Commonly known as: Crestor Take one tablet po at bedtime.       Discharge Instructions:  [facility]  Discharge Instructions Endovascular Aortic Aneurysm Repair  Please refer to the following instructions for your post-procedure care. Your surgeon or Physician Assistant will discuss any changes with you.  Activity  You are encouraged to walk as much  as you can. You can slowly return to normal activities but must avoid strenuous activity and heavy lifting until your doctor tells you it's OK. Avoid activities such as vacuuming or swinging a gold club. It is normal to feel tired for several weeks after your surgery. Do not drive until your doctor gives the OK and you are no longer taking prescription pain medications. It is also normal to have difficulty with sleep habits, eating, and bowel movements after surgery. These will go away with time.  Bathing/Showering  You may shower after you go home. If you have an incision, do not soak in a bathtub, hot tub, or swim until the incision heals completely.  Incision Care  Shower every day. Clean your incision with mild soap and water. Pat the area dry with a clean towel. You do not need a  bandage unless otherwise instructed. Do not apply any ointments or creams to your incision. If you clothing is irritating, you may cover your incision with a dry gauze pad.  Diet  Resume your normal diet. There are no special food restrictions following this procedure. A low fat/low cholesterol diet is recommended for all patients with vascular disease. In order to heal from your surgery, it is CRITICAL to get adequate nutrition. Your body requires vitamins, minerals, and protein. Vegetables are the best source of vitamins and minerals. Vegetables also provide the perfect balance of protein. Processed food has little nutritional value, so try to avoid this.  Medications  Resume taking all of your medications unless your doctor or Physician Assistnat tells you not to. If your incision is causing pain, you may take over-the-counter pain relievers such as acetaminophen (Tylenol). If you were prescribed a stronger pain medication, please be aware these medications can cause nausea and constipation. Prevent nausea by taking the medication with a snack or meal. Avoid constipation by drinking plenty of fluids and eating foods  with a high amount of fiber, such as fruits, vegetables, and grains.  Do not take Tylenol if you are taking prescription pain medications.   Follow up  [place] office will schedule a follow-up appointment with a C.T. scan 3-4 weeks after your surgery.  Please call us immediately for any of the following conditions  . Severe or worsening pain in your legs or feet or in your abdomen back or chest. . Increased pain, redness, drainage (pus) from your incision sit. . Increased abdominal pain, bloating, nausea, vomiting or persistent diarrhea. . Fever of 101 degrees or higher. . Swelling in your leg (s), .  Reduce your risk of vascular disease  .Stop smoking. If you would like help call [facility] at 1-800-QUIT-NOW (858) 430-0060) or [place] at 620-881-1301. .Manage your cholesterol .Maintain a desired weight .Control your diabetes .Keep your blood pressure down  If you have questions, please call the office at [phone].   Prescriptions given: 1.  Oxycodone- Acetaminophen #15 No Refill  Disposition: Home  Patient's condition: is Excellent  Follow up: 1. Dr. Donnetta Hutching in 4 weeks with CTA protocol   Leontine Locket, PA-C [facility] [phone] 04/29/2020  7:37 AM   - For VQI Registry use - Post-op:  Time to Extubation: [X]  In OR, [ ]  < 12 hrs, [ ]  12-24 hrs, [ ]  >=24 hrs Vasopressors Req. Post-op: No MI: No., [ ]  Troponin only, [ ]  EKG or Clinical New Arrhythmia: No CHF: No ICU Stay: 0 days Transfusion: No     If yes, 0 units given  Complications: Resp failure: No., [ ]  Pneumonia, [ ]  Ventilator Chg in renal function: No., [ ]  Inc. Cr > 0.5, [ ]  Temp. Dialysis,  [ ]  Permanent dialysis Leg ischemia: No., no Surgery needed, [ ]  Yes, Surgery needed,  [ ]  Amputation Bowel ischemia: No., [ ]  Medical Rx, [ ]  Surgical Rx Wound complication: No., [ ]  Superficial separation/infection, [ ]  Return to OR Return to OR: No  Return to OR for bleeding:  No Stroke: No., [ ]  Minor, [ ]  Major  Discharge medications: Statin use:  Yes  ASA use:  Yes  Plavix use:  No  Beta blocker use:  No  ARB use:  No ACEI use:  Yes CCB use:  No

## 2020-05-07 ENCOUNTER — Other Ambulatory Visit: Payer: Self-pay | Admitting: *Deleted

## 2020-05-07 DIAGNOSIS — I714 Abdominal aortic aneurysm, without rupture, unspecified: Secondary | ICD-10-CM

## 2020-05-12 ENCOUNTER — Ambulatory Visit (INDEPENDENT_AMBULATORY_CARE_PROVIDER_SITE_OTHER)
Admission: RE | Admit: 2020-05-12 | Discharge: 2020-05-12 | Disposition: A | Discharge status: Home / Self Care | Payer: Self-pay | Source: Ambulatory Visit | Attending: Cardiology | Admitting: Cardiology

## 2020-05-12 ENCOUNTER — Other Ambulatory Visit: Payer: Self-pay

## 2020-05-12 DIAGNOSIS — Z136 Encounter for screening for cardiovascular disorders: Secondary | ICD-10-CM

## 2020-05-13 ENCOUNTER — Telehealth: Payer: Self-pay | Admitting: *Deleted

## 2020-05-13 NOTE — Telephone Encounter (Signed)
Calcium score mildly elevated. Continue aspirin 81 mg daily. Add Crestor 40 mg daily. Check lipids and liver in 12 weeks.  Kirk Ruths   Left message for pt to call

## 2020-05-13 NOTE — Telephone Encounter (Signed)
Spoke with patient about results He states he started on crestor 20mg  after his labs 03/17/2020 (LDL 152)  He would like to see how his numbers respond to this dose before increasing medication  He already has labs planned thru PCP due Dec/Jan to recheck cholesterol He would be open to crestor 40mg  if LDL not at target, which I explained would ideally be less than 70 Offered to send him dietary info, which he declined Asked that he call office or send MyChart message once labs with PCP are completed, as they may not automatically be sent to Dr. Stanford Breed to review

## 2020-05-13 NOTE — Telephone Encounter (Signed)
Patient is returning call.  °

## 2020-05-24 ENCOUNTER — Other Ambulatory Visit: Payer: Self-pay | Admitting: Family Medicine

## 2020-05-24 DIAGNOSIS — I10 Essential (primary) hypertension: Secondary | ICD-10-CM

## 2020-05-26 ENCOUNTER — Other Ambulatory Visit: Payer: Self-pay

## 2020-05-26 ENCOUNTER — Ambulatory Visit (HOSPITAL_COMMUNITY)
Admission: RE | Admit: 2020-05-26 | Discharge: 2020-05-26 | Disposition: A | Discharge status: Home / Self Care | Payer: 59 | Source: Ambulatory Visit | Attending: Vascular Surgery | Admitting: Vascular Surgery

## 2020-05-26 DIAGNOSIS — I714 Abdominal aortic aneurysm, without rupture, unspecified: Secondary | ICD-10-CM

## 2020-05-26 MED ORDER — IOHEXOL 350 MG/ML SOLN
100.0000 mL | Freq: Once | INTRAVENOUS | Status: AC | PRN
  Administered 2020-05-26: 100 mL via INTRAVENOUS

## 2020-05-31 ENCOUNTER — Other Ambulatory Visit: Payer: Self-pay

## 2020-05-31 ENCOUNTER — Encounter: Payer: Self-pay | Admitting: Vascular Surgery

## 2020-05-31 ENCOUNTER — Ambulatory Visit (INDEPENDENT_AMBULATORY_CARE_PROVIDER_SITE_OTHER): Payer: Self-pay | Admitting: Vascular Surgery

## 2020-05-31 VITALS — BP 147/86 | HR 67 | Temp 98.1°F | Resp 14 | Ht 73.5 in | Wt 179.0 lb

## 2020-05-31 DIAGNOSIS — I714 Abdominal aortic aneurysm, without rupture, unspecified: Secondary | ICD-10-CM

## 2020-05-31 NOTE — Progress Notes (Signed)
   [facility] [place]  Patient name: Billy Mccormick MRN: [id number] DOB: 10-18-1955 Sex: male  REASON FOR VISIT: Follow-up stent graft repair abdominal aortic aneurysm on 04/28/2020  HPI: JAYCE KAINZ II is a 64 y.o. male here today for follow-up.  He underwent uneventful stent graft repair and was discharged home on postoperative day #1.  He has had no postoperative complications and is returned to his normal baseline  Current Outpatient Medications  Medication Sig Dispense Refill  . amLODipine (NORVASC) 5 MG tablet TAKE 1 TABLET(5 MG) BY MOUTH DAILY 90 tablet 1  . aspirin EC 81 MG tablet Take 81 mg by mouth daily. Swallow whole.     . enalapril (VASOTEC) 10 MG tablet Take 1 tablet (10 mg total) by mouth 2 (two) times daily. TAKE 1 TABLET(5 MG) BY MOUTH DAILY (Patient taking differently: Take 10 mg by mouth daily. ) 60 tablet 2  . pantoprazole (PROTONIX) 40 MG tablet TAKE 1 TABLET(40 MG) BY MOUTH DAILY (Patient taking differently: Take 40 mg by mouth daily. ) 90 tablet 1  . rosuvastatin (CRESTOR) 20 MG tablet Take one tablet po at bedtime. 30 tablet 3   No current facility-administered medications for this visit.     PHYSICAL EXAM: Vitals:   05/31/20 1028  BP: (!) 147/86  Pulse: 67  Resp: 14  Temp: 98.1 F (36.7 C)  TempSrc: Other (Comment)  SpO2: 97%  Weight: 179 lb (81.2 kg)  Height: 6' 1.5" (1.867 m)    GENERAL: The patient is a well-nourished male, in no acute distress. The vital signs are documented above. Abdomen soft and nontender.  I do not palpate an aneurysm.  Groin without false aneurysm.  No bruising.  Palpable dorsalis pedis bilaterally  I reviewed his CT scan and actual images with the patient from 05/26/2020.  This shows excellent positioning of his infrarenal stent graft extending into the common iliac arteries bilaterally.  His maximal diameter of his aneurysm is not significantly changed.  Was  measured at 5.4 preoperative and 5.3 postoperative.  MEDICAL ISSUES: Stable status post elective repair of infrarenal abdominal arctic aneurysm.  We will continue full activities.  We will see him in 1 year with ultrasound follow-up   Rosetta Posner, MD [facility] Tel [phone]

## 2020-07-05 ENCOUNTER — Other Ambulatory Visit: Payer: Self-pay

## 2020-07-05 ENCOUNTER — Other Ambulatory Visit: Payer: Self-pay | Admitting: Family Medicine

## 2020-07-05 ENCOUNTER — Telehealth: Payer: Self-pay | Admitting: Family Medicine

## 2020-07-05 DIAGNOSIS — I10 Essential (primary) hypertension: Secondary | ICD-10-CM

## 2020-07-05 MED ORDER — PANTOPRAZOLE SODIUM 40 MG PO TBEC
DELAYED_RELEASE_TABLET | ORAL | 1 refills | Status: DC
Start: 2020-07-05 — End: 2020-12-16

## 2020-07-05 NOTE — Telephone Encounter (Signed)
error 

## 2020-08-16 ENCOUNTER — Other Ambulatory Visit: Payer: Self-pay | Admitting: Family Medicine

## 2020-09-07 DIAGNOSIS — I10 Essential (primary) hypertension: Secondary | ICD-10-CM | POA: Diagnosis not present

## 2020-09-07 DIAGNOSIS — E785 Hyperlipidemia, unspecified: Secondary | ICD-10-CM | POA: Diagnosis not present

## 2020-09-08 LAB — COMPREHENSIVE METABOLIC PANEL
ALT: 24 IU/L (ref 0–44)
AST: 25 IU/L (ref 0–40)
Albumin/Globulin Ratio: 1.9 (ref 1.2–2.2)
Albumin: 4.8 g/dL (ref 3.8–4.8)
Alkaline Phosphatase: 79 IU/L (ref 44–121)
BUN/Creatinine Ratio: 12 (ref 10–24)
BUN: 11 mg/dL (ref 8–27)
Bilirubin Total: 0.3 mg/dL (ref 0.0–1.2)
CO2: 21 mmol/L (ref 20–29)
Calcium: 9.8 mg/dL (ref 8.6–10.2)
Chloride: 104 mmol/L (ref 96–106)
Creatinine, Ser: 0.92 mg/dL (ref 0.76–1.27)
GFR calc Af Amer: 101 mL/min/{1.73_m2} (ref >59)
GFR calc non Af Amer: 88 mL/min/{1.73_m2} (ref >59)
Globulin, Total: 2.5 g/dL (ref 1.5–4.5)
Glucose: 103 mg/dL — ABNORMAL HIGH (ref 65–99)
Potassium: 5.3 mmol/L — ABNORMAL HIGH (ref 3.5–5.2)
Sodium: 141 mmol/L (ref 134–144)
Total Protein: 7.3 g/dL (ref 6.0–8.5)

## 2020-09-08 LAB — LIPID PANEL
Chol/HDL Ratio: 2.4 ratio (ref 0.0–5.0)
Cholesterol, Total: 140 mg/dL (ref 100–199)
HDL: 58 mg/dL (ref >39)
LDL Chol Calc (NIH): 66 mg/dL (ref 0–99)
Triglycerides: 85 mg/dL (ref 0–149)
VLDL Cholesterol Cal: 16 mg/dL (ref 5–40)

## 2020-10-03 ENCOUNTER — Telehealth: Payer: Self-pay | Admitting: Family Medicine

## 2020-10-03 DIAGNOSIS — I10 Essential (primary) hypertension: Secondary | ICD-10-CM

## 2020-10-04 NOTE — Telephone Encounter (Signed)
Needs f/u htn in next 30 days. Thx. Dr. Lovena Le

## 2020-10-04 NOTE — Telephone Encounter (Signed)
No results found for: HGBA1C  Lab Results  Component Value Date   CREATININE 0.92 09/07/2020     Lab Results  Component Value Date   CHOL 140 09/07/2020   HDL 58 09/07/2020   LDLCALC 66 09/07/2020   TRIG 85 09/07/2020   CHOLHDL 2.4 09/07/2020     BP Readings from Last 3 Encounters:  05/31/20 (!) 147/86  04/29/20 126/84  04/22/20 (!) 136/94

## 2020-10-04 NOTE — Telephone Encounter (Signed)
Please call patient to have him set up appt. Thank you

## 2020-10-05 NOTE — Telephone Encounter (Signed)
Sent my chart message to schedule appointment.

## 2020-10-26 NOTE — Telephone Encounter (Signed)
Please send back after making appt.

## 2020-10-26 NOTE — Telephone Encounter (Signed)
Left voice mail

## 2020-10-26 NOTE — Telephone Encounter (Signed)
Pt is wanting to know if it can be put out for two months due to insurance purposes?

## 2020-11-04 NOTE — Telephone Encounter (Signed)
Please advise. Thank you

## 2020-11-04 NOTE — Telephone Encounter (Signed)
Called and sent my chart message but no response

## 2020-11-08 MED ORDER — ENALAPRIL MALEATE 10 MG PO TABS: 10.0000 mg | ORAL_TABLET | Freq: Two times a day (BID) | ORAL | 0 refills | Status: DC

## 2020-11-08 MED ORDER — AMLODIPINE BESYLATE 5 MG PO TABS: ORAL_TABLET | ORAL | 0 refills | Status: DC

## 2020-11-08 NOTE — Telephone Encounter (Signed)
The pt hasn't been in since 9/21 and is following up for htn and AAA repair.  Pt has elevated blood pressures since then.  Needs a follow up appt for more refills. What is the issue with insurance?    Thx.  Dr. Lovena Le

## 2020-11-08 NOTE — Addendum Note (Signed)
Addended by: Erven Colla on: 11/08/2020 12:10 PM   Modules accepted: Orders

## 2020-11-08 NOTE — Telephone Encounter (Signed)
Left message to return call 

## 2020-11-08 NOTE — Telephone Encounter (Signed)
Patient scheduled office visit with Dr Lovena Le for blood pressure check up.

## 2020-12-16 ENCOUNTER — Encounter: Payer: Self-pay | Admitting: Family Medicine

## 2020-12-16 ENCOUNTER — Ambulatory Visit (INDEPENDENT_AMBULATORY_CARE_PROVIDER_SITE_OTHER): Payer: BC Managed Care – PPO | Admitting: Family Medicine

## 2020-12-16 ENCOUNTER — Other Ambulatory Visit: Payer: Self-pay

## 2020-12-16 VITALS — BP 138/80 | HR 65 | Temp 97.2°F | Ht 73.5 in | Wt 179.4 lb

## 2020-12-16 DIAGNOSIS — I10 Essential (primary) hypertension: Secondary | ICD-10-CM | POA: Diagnosis not present

## 2020-12-16 DIAGNOSIS — H6121 Impacted cerumen, right ear: Secondary | ICD-10-CM | POA: Diagnosis not present

## 2020-12-16 DIAGNOSIS — H6122 Impacted cerumen, left ear: Secondary | ICD-10-CM

## 2020-12-16 MED ORDER — ROSUVASTATIN CALCIUM 20 MG PO TABS
ORAL_TABLET | ORAL | 1 refills | Status: DC
Start: 2020-12-16 — End: 2021-07-21

## 2020-12-16 MED ORDER — AMLODIPINE BESYLATE 5 MG PO TABS: ORAL_TABLET | ORAL | 1 refills | Status: DC

## 2020-12-16 MED ORDER — ENALAPRIL MALEATE 10 MG PO TABS: 10.0000 mg | ORAL_TABLET | Freq: Two times a day (BID) | ORAL | 1 refills | Status: DC

## 2020-12-16 MED ORDER — PANTOPRAZOLE SODIUM 40 MG PO TBEC
DELAYED_RELEASE_TABLET | ORAL | 1 refills | Status: DC
Start: 2020-12-16 — End: 2021-04-01

## 2020-12-16 NOTE — Progress Notes (Signed)
Patient ID: Billy Mccormick, male    DOB: 03-Jul-1956, 65 y.o.   MRN: [id number]   Chief Complaint  Patient presents with   Hypertension   Subjective:    HPI  Pt here for blood pressure check. Pt states his BP is higher in the morning. No issues. Having right ear pain, sound of air rushing through, fullness and loss of hearing. Does wear ear plugs a lot and that cause a great deal of discomfort.   This spring worsening.  Been going on for 2 yrs.  Using ear plugs and then feeling pain in jaw and and on side of neck.  Was on potassium supplement, and stopped it 8-9 months ago.  Had AAA surgery.  Recovered well.    Medical History Billy Mccormick has a past medical history of Degenerative cervical disc, Essential hypertension (10/02/2015), Hyperlipidemia, IFG (impaired fasting glucose), Lumbar herniated disc (2015), and Psoriasis.   Outpatient Encounter Medications as of 12/16/2020  Medication Sig   aspirin EC 81 MG tablet Take 81 mg by mouth daily. Swallow whole.    [DISCONTINUED] amLODipine (NORVASC) 5 MG tablet TAKE 1 TABLET(5 MG) BY MOUTH DAILY   [DISCONTINUED] enalapril (VASOTEC) 10 MG tablet Take 1 tablet (10 mg total) by mouth 2 (two) times daily.   [DISCONTINUED] pantoprazole (PROTONIX) 40 MG tablet TAKE 1 TABLET(40 MG) BY MOUTH DAILY   [DISCONTINUED] rosuvastatin (CRESTOR) 20 MG tablet TAKE 1 TABLET BY MOUTH AT BEDTIME   amLODipine (NORVASC) 5 MG tablet TAKE 1 TABLET(5 MG) BY MOUTH DAILY   enalapril (VASOTEC) 10 MG tablet Take 1 tablet (10 mg total) by mouth 2 (two) times daily.   pantoprazole (PROTONIX) 40 MG tablet TAKE 1 TABLET(40 MG) BY MOUTH DAILY   rosuvastatin (CRESTOR) 20 MG tablet TAKE 1 TABLET BY MOUTH AT BEDTIME   No facility-administered encounter medications on file as of 12/16/2020.     Review of Systems  Constitutional:  Negative for chills and fever.  HENT:  Positive for ear pain and hearing loss. Negative for congestion, rhinorrhea, sinus pressure, sinus  pain, sneezing and sore throat.   Eyes:  Negative for pain, discharge and itching.  Respiratory:  Negative for cough.   Gastrointestinal:  Negative for diarrhea, nausea and vomiting.  Skin:  Negative for rash.  Neurological:  Negative for headaches.    Vitals BP 138/80   Pulse 65   Temp (!) 97.2 F (36.2 C)   Ht 6' 1.5" (1.867 m)   Wt 179 lb 6.4 oz (81.4 kg)   SpO2 97%   BMI 23.35 kg/m   Objective:   Physical Exam Vitals reviewed.  Constitutional:      Appearance: Normal appearance.  HENT:     Head: Normocephalic.     Right Ear: External ear normal. There is impacted cerumen.     Left Ear: Tympanic membrane, ear canal and external ear normal.     Nose: Nose normal. No congestion.     Mouth/Throat:     Mouth: Mucous membranes are moist.     Pharynx: No oropharyngeal exudate.  Eyes:     Extraocular Movements: Extraocular movements intact.     Conjunctiva/sclera: Conjunctivae normal.     Pupils: Pupils are equal, round, and reactive to light.  Cardiovascular:     Rate and Rhythm: Normal rate and regular rhythm.     Pulses: Normal pulses.     Heart sounds: Normal heart sounds. No murmur heard. Pulmonary:     Effort: Pulmonary effort  is normal.     Breath sounds: Normal breath sounds. No wheezing, rhonchi or rales.  Musculoskeletal:        General: Normal range of motion.     Right lower leg: No edema.     Left lower leg: No edema.  Skin:    General: Skin is warm and dry.     Findings: No rash.  Neurological:     General: No focal deficit present.     Mental Status: He is alert and oriented to person, place, and time.  Psychiatric:        Mood and Affect: Mood normal.        Behavior: Behavior normal.     Assessment and Plan   1. Impacted cerumen of right ear - Ambulatory referral to ENT  2. Elevated blood pressure reading with diagnosis of hypertension - amLODipine (NORVASC) 5 MG tablet; TAKE 1 TABLET(5 MG) BY MOUTH DAILY  Dispense: 90 tablet; Refill:  1  3. Hypertension, unspecified type - enalapril (VASOTEC) 10 MG tablet; Take 1 tablet (10 mg total) by mouth 2 (two) times daily.  Dispense: 180 tablet; Refill: 1   Referral to ENT for impacted cerumen.  Htn- stable. Cont meds.    Return in about 6 months (around 06/17/2021) for htn, hld.

## 2020-12-31 ENCOUNTER — Telehealth: Payer: Self-pay | Admitting: *Deleted

## 2020-12-31 NOTE — Telephone Encounter (Signed)
error 

## 2021-01-20 ENCOUNTER — Other Ambulatory Visit: Payer: Self-pay | Admitting: *Deleted

## 2021-01-20 DIAGNOSIS — Z87891 Personal history of nicotine dependence: Secondary | ICD-10-CM

## 2021-01-20 DIAGNOSIS — F17210 Nicotine dependence, cigarettes, uncomplicated: Secondary | ICD-10-CM

## 2021-01-24 DIAGNOSIS — H903 Sensorineural hearing loss, bilateral: Secondary | ICD-10-CM | POA: Diagnosis not present

## 2021-01-24 DIAGNOSIS — H6121 Impacted cerumen, right ear: Secondary | ICD-10-CM | POA: Diagnosis not present

## 2021-02-14 ENCOUNTER — Ambulatory Visit (HOSPITAL_COMMUNITY)
Admission: RE | Admit: 2021-02-14 | Discharge: 2021-02-14 | Disposition: A | Discharge status: Home / Self Care | Payer: BC Managed Care – PPO | Source: Ambulatory Visit | Attending: Acute Care | Admitting: Acute Care

## 2021-02-14 ENCOUNTER — Other Ambulatory Visit: Payer: Self-pay

## 2021-02-14 DIAGNOSIS — F17210 Nicotine dependence, cigarettes, uncomplicated: Secondary | ICD-10-CM | POA: Diagnosis not present

## 2021-02-14 DIAGNOSIS — J439 Emphysema, unspecified: Secondary | ICD-10-CM | POA: Diagnosis not present

## 2021-02-14 DIAGNOSIS — Z122 Encounter for screening for malignant neoplasm of respiratory organs: Secondary | ICD-10-CM | POA: Insufficient documentation

## 2021-02-14 DIAGNOSIS — I700 Atherosclerosis of aorta: Secondary | ICD-10-CM | POA: Diagnosis not present

## 2021-02-14 DIAGNOSIS — I2510 Atherosclerotic heart disease of native coronary artery without angina pectoris: Secondary | ICD-10-CM | POA: Insufficient documentation

## 2021-02-14 DIAGNOSIS — Z87891 Personal history of nicotine dependence: Secondary | ICD-10-CM

## 2021-02-15 ENCOUNTER — Encounter: Payer: Self-pay | Admitting: *Deleted

## 2021-02-15 DIAGNOSIS — Z87891 Personal history of nicotine dependence: Secondary | ICD-10-CM

## 2021-02-15 DIAGNOSIS — F17210 Nicotine dependence, cigarettes, uncomplicated: Secondary | ICD-10-CM

## 2021-02-15 NOTE — Progress Notes (Signed)
Please call patient and let them  know their  low dose Ct was read as a Lung RADS 2: nodules that are benign in appearance and behavior with a very low likelihood of becoming a clinically active cancer due to size or lack of growth. Recommendation per radiology is for a repeat LDCT in 12 months. .Please let them  know we will order and schedule their  annual screening scan for 01/2022. Please let them  know there was notation of CAD on their  scan.  Please remind the patient  that this is a non-gated exam therefore degree or severity of disease  cannot be determined. Please have them  follow up with their PCP regarding potential risk factor modification, dietary therapy or pharmacologic therapy if clinically indicated. Pt.  is  currently on statin therapy. Please place order for annual  screening scan for  01/2022 and fax results to PCP. Thanks so much. 

## 2021-04-01 ENCOUNTER — Other Ambulatory Visit: Payer: Self-pay | Admitting: Family Medicine

## 2021-04-14 ENCOUNTER — Telehealth: Payer: Self-pay | Admitting: Nurse Practitioner

## 2021-04-14 NOTE — Telephone Encounter (Signed)
Fax from [facility] stating that Pantoprazole 40 mg tablets has been denied. Denied due to medication is covered when two OTC generic PPI such as omeprazole, lansoprazole have been tried and did not work, or when the member cannot take all other OTC generic PPI medications. Fax in provider office for review. Please advise. Thank you

## 2021-04-15 NOTE — Telephone Encounter (Signed)
Pt contacted. Pt states he has not tried any OTC meds at this time. Pt states he has enough at this time. Pt also states that his insurance will change in November and they will cover Pantoprazole

## 2021-06-27 ENCOUNTER — Other Ambulatory Visit: Payer: Self-pay | Admitting: Family Medicine

## 2021-06-27 DIAGNOSIS — I10 Essential (primary) hypertension: Secondary | ICD-10-CM

## 2021-07-12 ENCOUNTER — Other Ambulatory Visit: Payer: Self-pay | Admitting: Family Medicine

## 2021-07-12 DIAGNOSIS — I10 Essential (primary) hypertension: Secondary | ICD-10-CM

## 2021-07-12 NOTE — Telephone Encounter (Signed)
Please contact patient to have him establish care with Dr.Cook. Thank you.

## 2021-07-13 ENCOUNTER — Telehealth: Payer: Self-pay | Admitting: Family Medicine

## 2021-07-13 DIAGNOSIS — I10 Essential (primary) hypertension: Secondary | ICD-10-CM

## 2021-07-13 NOTE — Telephone Encounter (Signed)
Pt has an appt with Barbee Shropshire Thursday 07/21/21 to establish care. Pt only has 3 days worth of Enapril 10 mg left. [facility] on [address]  [phone]

## 2021-07-13 NOTE — Telephone Encounter (Signed)
Please advise. Thank you

## 2021-07-14 ENCOUNTER — Other Ambulatory Visit: Payer: Self-pay | Admitting: *Deleted

## 2021-07-14 DIAGNOSIS — I714 Abdominal aortic aneurysm, without rupture, unspecified: Secondary | ICD-10-CM

## 2021-07-14 MED ORDER — AMLODIPINE BESYLATE 5 MG PO TABS: ORAL_TABLET | ORAL | 0 refills | Status: DC

## 2021-07-14 MED ORDER — ENALAPRIL MALEATE 10 MG PO TABS: 10.0000 mg | ORAL_TABLET | Freq: Two times a day (BID) | ORAL | 0 refills | Status: DC

## 2021-07-14 NOTE — Telephone Encounter (Signed)
Enalapril sent to pharmacy. (Amlodipine sent in by accident) Pt contacted and is aware

## 2021-07-16 MED ORDER — AMLODIPINE BESYLATE 5 MG PO TABS: ORAL_TABLET | ORAL | 0 refills | Status: DC

## 2021-07-20 ENCOUNTER — Ambulatory Visit: Payer: BC Managed Care – PPO | Admitting: Vascular Surgery

## 2021-07-20 ENCOUNTER — Other Ambulatory Visit: Payer: BC Managed Care – PPO

## 2021-07-20 ENCOUNTER — Ambulatory Visit (INDEPENDENT_AMBULATORY_CARE_PROVIDER_SITE_OTHER): Payer: Medicare Other | Admitting: Vascular Surgery

## 2021-07-20 ENCOUNTER — Ambulatory Visit (INDEPENDENT_AMBULATORY_CARE_PROVIDER_SITE_OTHER): Payer: Medicare Other

## 2021-07-20 ENCOUNTER — Other Ambulatory Visit: Payer: Self-pay

## 2021-07-20 ENCOUNTER — Encounter: Payer: Self-pay | Admitting: Vascular Surgery

## 2021-07-20 VITALS — BP 142/88 | HR 82 | Temp 98.1°F | Ht 73.0 in | Wt 180.2 lb

## 2021-07-20 DIAGNOSIS — I714 Abdominal aortic aneurysm, without rupture, unspecified: Secondary | ICD-10-CM

## 2021-07-20 NOTE — Progress Notes (Signed)
[facility]  Patient name: Billy Mccormick MRN: [id number] DOB: Apr 24, 1956 Sex: male  REASON FOR VISIT: Follow-up stent graft repair abdominal aortic aneurysm  HPI: Billy Mccormick is a 66 y.o. male here today for follow-up.  He underwent uneventful stent graft repair on 04/28/2020 of infrarenal abdominal aortic aneurysm.  His maximal aortic size preoperatively was 5.4 cm.  He continues to do well with no cardiac disease and no major medical difficulties.  Past Medical History:  Diagnosis Date   Degenerative cervical disc    Essential hypertension 10/02/2015   Hyperlipidemia    IFG (impaired fasting glucose)    Lumbar herniated disc 2015   L4,L5   Psoriasis     Family History  Problem Relation Age of Onset   Hyperlipidemia Mother    Hyperlipidemia Father    Kidney disease Brother    Colon cancer Neg Hx     SOCIAL HISTORY: Social History   Tobacco Use   Smoking status: Every Day    Packs/day: 0.25    Years: 35.00    Pack years: 8.75    Types: Cigarettes   Smokeless tobacco: Never   Tobacco comments:    8 cigarettes per day  Substance Use Topics   Alcohol use: Yes    Alcohol/week: 10.0 standard drinks    Types: 7 Cans of beer, 3 Shots of liquor per week    Comment: 6 beers about 3 days weekly    Allergies  Allergen Reactions   Prednisone Anxiety    Current Outpatient Medications  Medication Sig Dispense Refill   amLODipine (NORVASC) 5 MG tablet TAKE 1 TABLET(5 MG) BY MOUTH DAILY 90 tablet 0   aspirin EC 81 MG tablet Take 81 mg by mouth daily. Swallow whole.      enalapril (VASOTEC) 10 MG tablet Take 1 tablet (10 mg total) by mouth 2 (two) times daily. 60 tablet 0   pantoprazole (PROTONIX) 40 MG tablet TAKE 1 TABLET BY MOUTH EVERY DAY 90 tablet 0   rosuvastatin (CRESTOR) 20 MG tablet TAKE 1 TABLET BY MOUTH AT BEDTIME 90 tablet 1   No current facility-administered medications for this visit.     REVIEW OF SYSTEMS:  [X]  denotes positive finding, [ ]  denotes negative finding Cardiac  Comments:  Chest pain or chest pressure:    Shortness of breath upon exertion:    Short of breath when lying flat:    Irregular heart rhythm:        Vascular    Pain in calf, thigh, or hip brought on by ambulation:    Pain in feet at night that wakes you up from your sleep:     Blood clot in your veins:    Leg swelling:           PHYSICAL EXAM: Vitals:   07/20/21 0912  BP: (!) 142/88  Pulse: 82  Temp: 98.1 F (36.7 C)  TempSrc: Temporal  SpO2: 97%  Weight: 180 lb 4 oz (81.8 kg)  Height: 6\' 1"  (1.854 m)    GENERAL: The patient is a well-nourished male, in no acute distress. The vital signs are documented above. CARDIOVASCULAR: 2+ radial pulses and 2+ dorsalis pedis pulses bilaterally.  Abdomen soft and nontender.  I do not palpate an aneurysm PULMONARY: There is good air exchange  MUSCULOSKELETAL: There are no major deformities or cyanosis. NEUROLOGIC: No focal weakness or paresthesias are detected. SKIN: There are no ulcers or rashes noted. PSYCHIATRIC:  The patient has a normal affect.  DATA:  Duplex today revealed no evidence of endoleak.  His aneurysm sac size has diminished to 4.5 cm  MEDICAL ISSUES: Stable repair of abdominal aortic aneurysm with stent graft.  We will see him again in 2 years with repeat duplex    Rosetta Posner, MD FACS [facility] [phone]  Note: Portions of this report may have been transcribed using voice recognition software.  Every effort has been made to ensure accuracy; however, inadvertent computerized transcription errors may still be present.

## 2021-07-21 ENCOUNTER — Other Ambulatory Visit: Payer: Self-pay

## 2021-07-21 ENCOUNTER — Encounter: Payer: Self-pay | Admitting: Nurse Practitioner

## 2021-07-21 ENCOUNTER — Ambulatory Visit (INDEPENDENT_AMBULATORY_CARE_PROVIDER_SITE_OTHER): Payer: Medicare Other | Admitting: Nurse Practitioner

## 2021-07-21 VITALS — BP 120/80 | HR 93 | Temp 97.6°F | Ht 73.0 in | Wt 183.0 lb

## 2021-07-21 DIAGNOSIS — K259 Gastric ulcer, unspecified as acute or chronic, without hemorrhage or perforation: Secondary | ICD-10-CM

## 2021-07-21 DIAGNOSIS — I10 Essential (primary) hypertension: Secondary | ICD-10-CM

## 2021-07-21 DIAGNOSIS — R7301 Impaired fasting glucose: Secondary | ICD-10-CM

## 2021-07-21 DIAGNOSIS — E785 Hyperlipidemia, unspecified: Secondary | ICD-10-CM

## 2021-07-21 DIAGNOSIS — Z720 Tobacco use: Secondary | ICD-10-CM

## 2021-07-21 MED ORDER — ROSUVASTATIN CALCIUM 20 MG PO TABS: ORAL_TABLET | ORAL | 1 refills | Status: DC

## 2021-07-21 MED ORDER — PANTOPRAZOLE SODIUM 40 MG PO TBEC: DELAYED_RELEASE_TABLET | ORAL | 0 refills | Status: DC

## 2021-07-21 MED ORDER — ENALAPRIL MALEATE 10 MG PO TABS: 10.0000 mg | ORAL_TABLET | Freq: Two times a day (BID) | ORAL | 1 refills | Status: DC

## 2021-07-21 NOTE — Progress Notes (Signed)
Subjective:    Patient ID: Billy Mccormick, male    DOB: 25-Aug-1955, 66 y.o.   MRN: [id number]  HPI  Patient here for medications refill.  Hypertension Patient takes enalapril and amlodipine for BP. Patient states that BP run around 120/80 at home. However BP was elevated at yesterday's visit at 142/88. BP today 120/80. Patient denies feeling any headaches, palpitations, chest pain, back pain, or changes to vision.  GERD Patient takes Protonix for GERD. Patient stated that he has taken Protonix for 10 years. Has tried to stop Protonix use in the past but GERD comes back around day two.   Elevated LDL / AAA Patient being followed by vascular surgery for AAA. Patient denies any complications or concerns.  Smoker Patient down to 1/2 pack a day. Has attempted to quit but has found that cutting down slowly works well for him. Has Wellbutrin on hand from a previous prescription and is contemplating taking that to help with his efforts to quit.   Review of Systems  All other systems reviewed and are negative. Patient has no complaints     Objective:   Physical Exam Constitutional:      General: He is not in acute distress.    Appearance: Normal appearance. He is normal weight. He is not ill-appearing or toxic-appearing.  HENT:     Head: Normocephalic.  Cardiovascular:     Rate and Rhythm: Normal rate and regular rhythm.     Pulses: Normal pulses.     Heart sounds: Normal heart sounds. No murmur heard. Pulmonary:     Effort: Pulmonary effort is normal. No respiratory distress.     Breath sounds: No stridor. No wheezing, rhonchi or rales.     Comments: Slightly diminished Chest:     Chest wall: No tenderness.  Musculoskeletal:        General: Normal range of motion.  Skin:    General: Skin is warm.  Neurological:     General: No focal deficit present.     Mental Status: He is alert and oriented to person, place, and time.  Psychiatric:        Mood and Affect: Mood normal.         Behavior: Behavior normal.          Assessment & Plan:   1. Hypertension, unspecified type - BP today 120/80. [name] of 120/80 - Continue medication regiment - enalapril (VASOTEC) 10 MG tablet; Take 1 tablet (10 mg total) by mouth 2 (two) times daily.  Dispense: 180 tablet; Refill: 1 - HgB A1c - CMP14+EGFR - Urine Microalbumin w/creat. ratio  2. Gastric ulcer without hemorrhage or perforation, unspecified chronicity - Discuss the need to wean off of PPI. - [name] to cut pill in half or to take one pill every other day. - Patient to try OTC pepcid for relief in necessary - If symptoms not improved or worsened without a PPI, will consider H. Pylori testing.   3. Hyperlipidemia with target LDL less than 100 - Last Lipid panel WNL. - Good lipid control is necessary due to patient's hx of AAA - rosuvastatin (CRESTOR) 20 MG tablet; TAKE 1 TABLET BY MOUTH AT BEDTIME  Dispense: 90 tablet; Refill: 1 - Lipid Panel With LDL/HDL Ratio - HgB A1c  4. Elevated fasting blood sugar - HgB A1c  5. Smoking trying to quit - Discussed quitting in detail. - Patient having some challenge with breaking his morning coffee and smoking habit.  -  Discussed perhaps drinking coffee later in the morning to break habit. - Discussed chantix and nicorette gum, patient interested at this time. - May trial Wellbutrin to determine if it is helpful. - Continue to decrease cigarette consumption

## 2021-07-21 NOTE — Telephone Encounter (Signed)
Had appointment 07/21/2021

## 2021-07-22 ENCOUNTER — Other Ambulatory Visit (HOSPITAL_COMMUNITY): Payer: BC Managed Care – PPO

## 2021-07-22 ENCOUNTER — Ambulatory Visit: Payer: BC Managed Care – PPO | Admitting: Vascular Surgery

## 2021-07-28 LAB — CMP14+EGFR
ALT: 26 IU/L (ref 0–44)
AST: 24 IU/L (ref 0–40)
Albumin/Globulin Ratio: 1.7 (ref 1.2–2.2)
Albumin: 4.7 g/dL (ref 3.8–4.8)
Alkaline Phosphatase: 78 IU/L (ref 44–121)
BUN/Creatinine Ratio: 15 (ref 10–24)
BUN: 12 mg/dL (ref 8–27)
Bilirubin Total: 0.3 mg/dL (ref 0.0–1.2)
CO2: 24 mmol/L (ref 20–29)
Calcium: 9.9 mg/dL (ref 8.6–10.2)
Chloride: 103 mmol/L (ref 96–106)
Creatinine, Ser: 0.82 mg/dL (ref 0.76–1.27)
Globulin, Total: 2.7 g/dL (ref 1.5–4.5)
Glucose: 111 mg/dL — ABNORMAL HIGH (ref 70–99)
Potassium: 4.8 mmol/L (ref 3.5–5.2)
Sodium: 138 mmol/L (ref 134–144)
Total Protein: 7.4 g/dL (ref 6.0–8.5)
eGFR: 97 mL/min/{1.73_m2} (ref >59)

## 2021-07-28 LAB — MICROALBUMIN / CREATININE URINE RATIO
Creatinine, Urine: 110.7 mg/dL
Microalb/Creat Ratio: 15 mg/g creat (ref 0–29)
Microalbumin, Urine: 16.9 ug/mL

## 2021-07-28 LAB — LIPID PANEL WITH LDL/HDL RATIO
Cholesterol, Total: 139 mg/dL (ref 100–199)
HDL: 53 mg/dL (ref >39)
LDL Chol Calc (NIH): 72 mg/dL (ref 0–99)
LDL/HDL Ratio: 1.4 ratio (ref 0.0–3.6)
Triglycerides: 72 mg/dL (ref 0–149)
VLDL Cholesterol Cal: 14 mg/dL (ref 5–40)

## 2021-07-28 LAB — HEMOGLOBIN A1C
Est. average glucose Bld gHb Est-mCnc: 117 mg/dL
Hgb A1c MFr Bld: 5.7 % — ABNORMAL HIGH (ref 4.8–5.6)

## 2021-07-29 NOTE — Progress Notes (Signed)
Nurses please call patient with following message:  Your labs were reviewed. Labs look good. A1C in the prediabetic range. Medication is not needed at this time. We will manage slightly elevated A1C with diet and exercise. Eat well balanced diet with plenty lean means and veggies. Try to exercise 3-5x a week for ~30 mins a day.  Cholesterol looks great! Keep doing what you are doing there! Kidney looks good!  Barbee Shropshire

## 2021-08-19 IMAGING — CT CT CTA ABD/PEL W/CM AND/OR W/O CM
2 of 7 series · 14 of 46 positions shown, 16 images · IV contrast (OMNIPAQUE)
Comparison: MRI lumbar spine 02/07/2016 and CT from 08/11/2005

CLINICAL DATA: 63-year-old with known abdominal aortic aneurysm.

EXAM:
CTA ABDOMEN AND PELVIS WITHOUT AND WITH CONTRAST
TECHNIQUE: Multidetector CT imaging of the abdomen and pelvis was performed
using the standard protocol during bolus administration of
intravenous contrast. Multiplanar reconstructed images and MIPs were
obtained and reviewed to evaluate the vascular anatomy.
CONTRAST:  100mL OMNIPAQUE IOHEXOL 350 MG/ML SOLN

[Series 4: axial arterial · axial · arterial · 0.88mm/px · z∈[+908,+1349]mm · 11 of 167 slices shown, 13 images]
[im 10/167  soft-tissue]
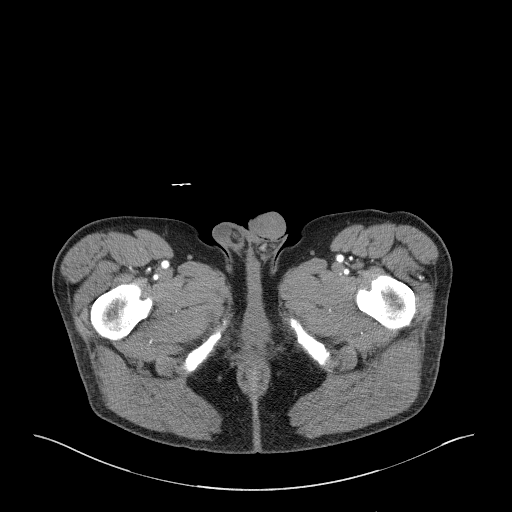
[im 10/167  bone]
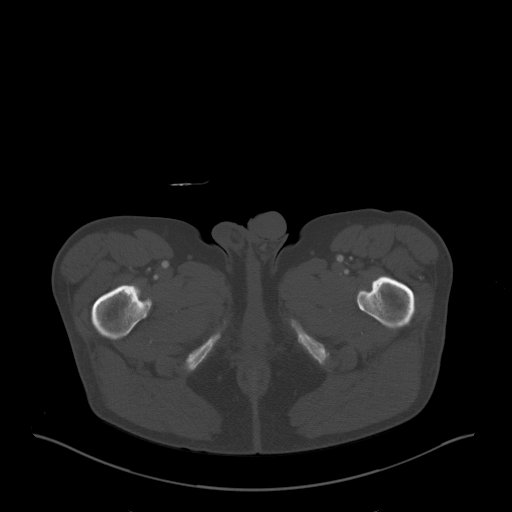
[im 28/167  soft-tissue]
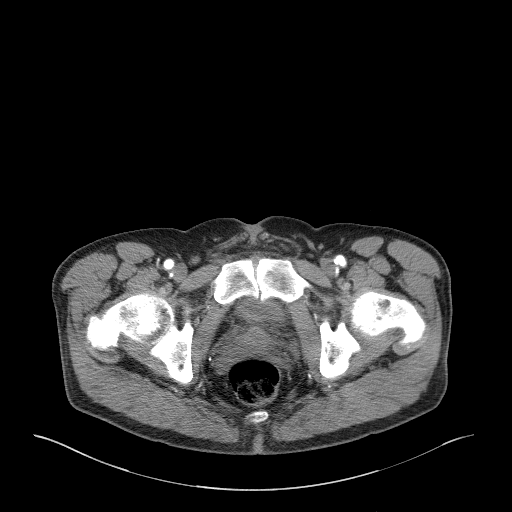
[im 37/167  soft-tissue]
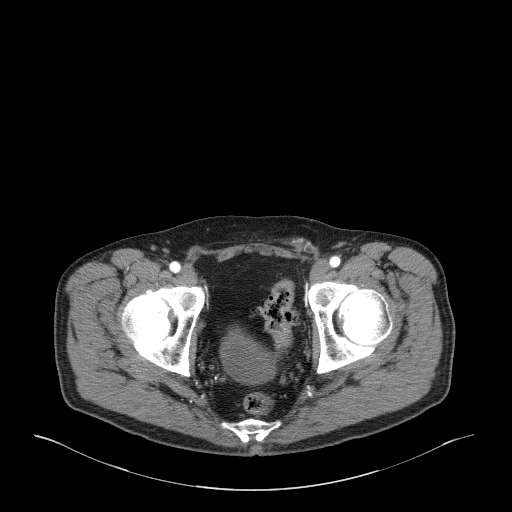
[im 56/167  soft-tissue]
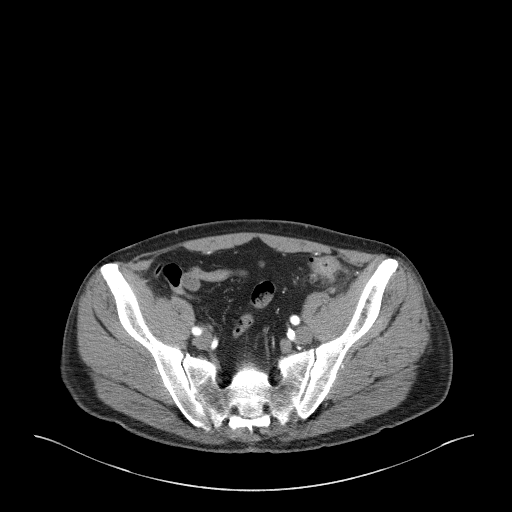
[im 65/167  soft-tissue]
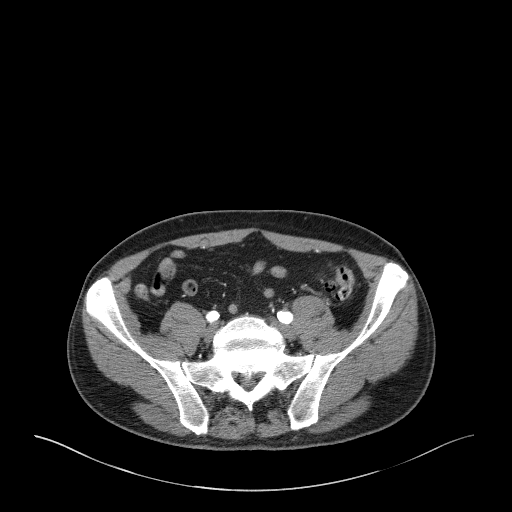
[im 84/167  soft-tissue]
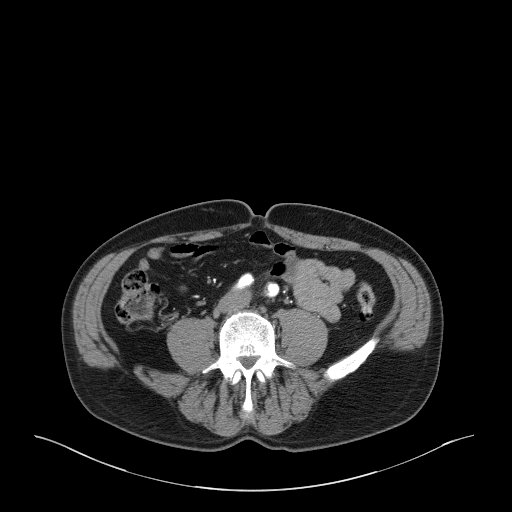
[im 102/167  soft-tissue]
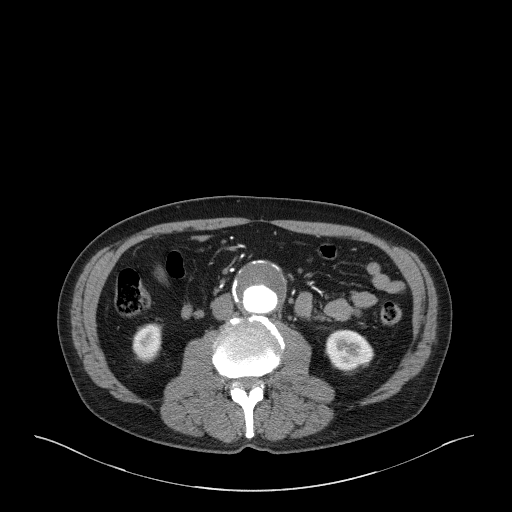
[im 111/167  soft-tissue]
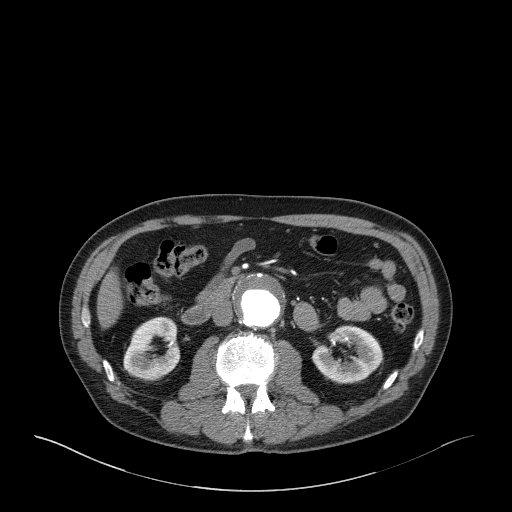
[im 130/167  soft-tissue]
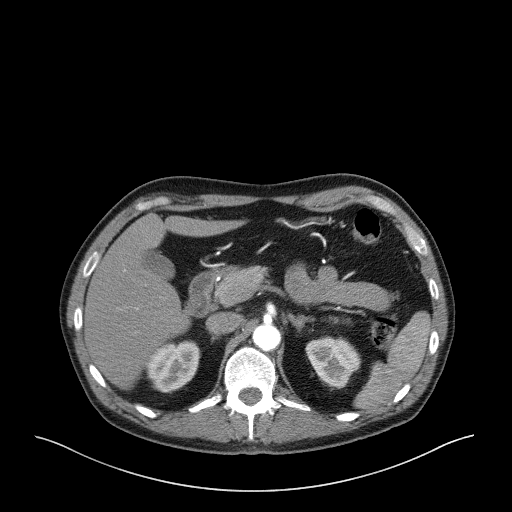
[im 130/167  bone]
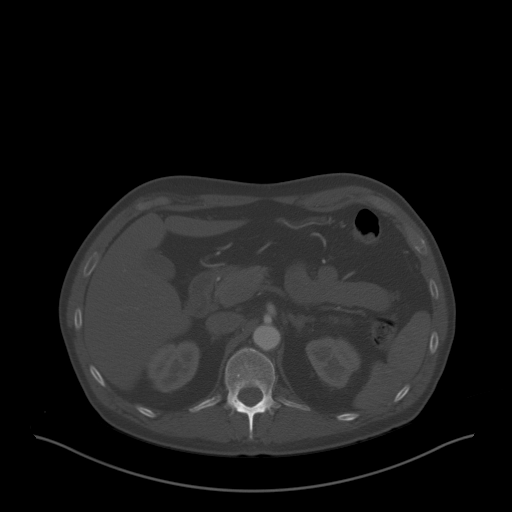
[im 139/167  soft-tissue]
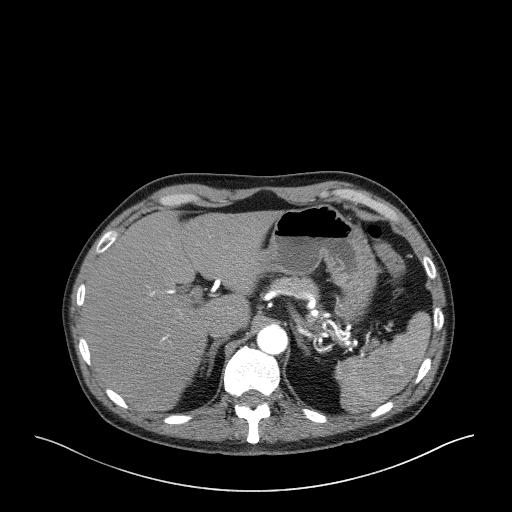
[im 157/167  soft-tissue]
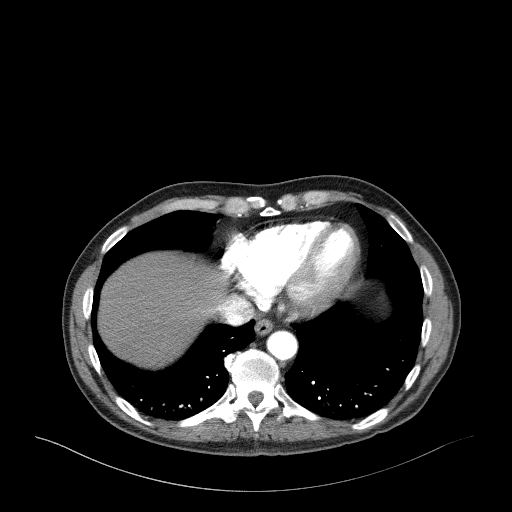

[Series 5: coronal · coronal · 0.97mm/px · 3 of 79 slices shown]
[im 20/79  soft-tissue]
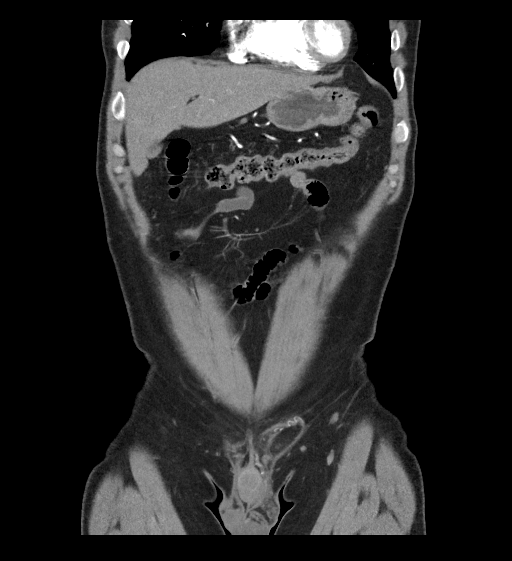
[im 40/79  soft-tissue]
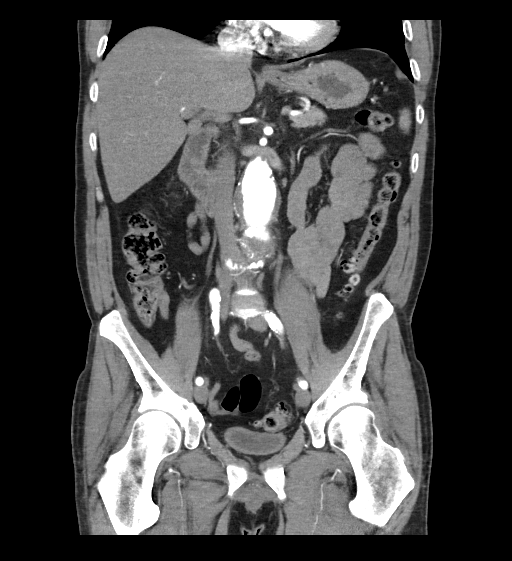
[im 59/79  soft-tissue]
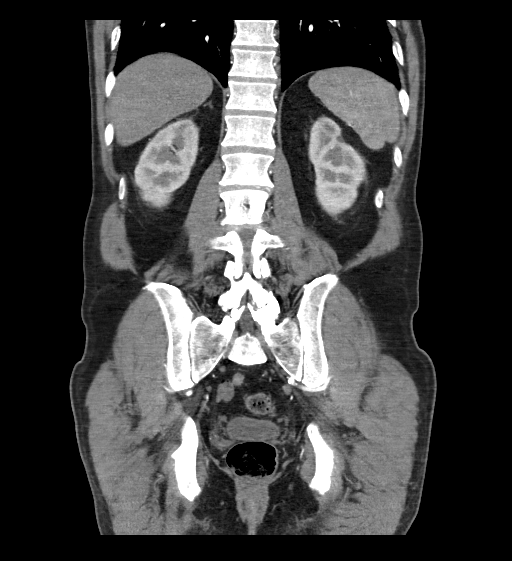

[14 of 46 positions shown; findings below may reference images not displayed]

FINDINGS: VASCULAR

Aorta: Infrarenal abdominal aortic aneurysm containing a large
amount of mural thrombus. Maximum dimension of the infrarenal
abdominal aorta is 5.4 cm and previously measured 4.1 cm in 7783. No
evidence for an aortic dissection. Aneurysm terminates at the aortic
bifurcation. Aortic diameter at the level of the renal arteries is
approximately 2.6 cm.

Celiac: Mild narrowing of the proximal celiac trunk is most
compatible with mild compression from the median arcuate ligament.
Main branches of the celiac trunk are patent.

SMA: Patent without evidence of aneurysm, dissection, vasculitis or
significant stenosis.

Renals: Single renal arteries bilaterally. Mild atherosclerotic
disease at the origin of the renal arteries without significant
stenosis. No evidence for dissection or aneurysm involving the renal
arteries.

IMA: Origin of the IMA is occluded due to the mural thrombus in the
aortic aneurysm. IMA beyond the origin is patent.

Inflow: Bilateral common, internal and external iliac arteries are
patent bilaterally. Atherosclerotic calcifications involving the
iliac arteries. Left common iliac artery is tortuous. Bilateral
external iliac arteries measure approximately 7 mm.

Proximal Outflow: Proximal femoral arteries are patent bilaterally.

Veins: Main portal vein is patent. Normal appearance of the IVC and
renal veins.

Review of the MIP images confirms the above findings.

NON-VASCULAR

Lower chest: Dependent densities in the lower lobes. No pleural
effusions. 5 mm calcified granuloma in the left lower lobe.

Hepatobiliary: Normal appearance of the liver and gallbladder. No
biliary dilatation.

Pancreas: Unremarkable. No pancreatic ductal dilatation or
surrounding inflammatory changes.

Spleen: Normal in size without focal abnormality.

Adrenals/Urinary Tract: Adrenal glands are unremarkable. Small
amount of fluid in the urinary bladder. No hydronephrosis. No
suspicious renal lesion. Small hypodensities in both kidneys that
are too small to definitively characterize but likely represent
small cysts.

Stomach/Bowel: Extensive colonic diverticulosis. Mild pericolonic
stranding in the left lower quadrant involving the proximal sigmoid
colon region. Findings are suggestive for mild colonic inflammation
in this area. No evidence for perforation or abscess. Normal
appendix in the right lower quadrant. Distal duodenum is draped over
the abdominal aortic aneurysm but no inflammatory changes in this
area. Normal appearance of the stomach.

Lymphatic: No abdominopelvic lymphadenopathy.

Reproductive: Prostate is unremarkable.

Other: No ascites. Negative for free intraperitoneal air. Small
umbilical hernia containing fat.

Musculoskeletal: Bilateral pars defects at L5. Grade 1
anterolisthesis at L5-S1 measuring roughly 4 mm.
IMPRESSION: VASCULAR

1. Infrarenal abdominal aortic aneurysm measuring up to 5.4 cm.
Aneurysm has enlarged since 7783 when it measured 4.1 cm. Large
amount of mural thrombus within the aneurysm.
2. Mild atherosclerotic disease involving the iliac arteries. Left
common iliac artery is tortuous.

NON-VASCULAR

1. **An incidental finding of potential clinical significance has
been found. Extensive colonic diverticulosis with mild pericolonic
inflammation in the left lower quadrant associated with the proximal
sigmoid colon. Findings are suggestive for acute diverticulitis. No
evidence for perforation or abscess formation.**
2. Mild anterolisthesis at L5-S1 with bilateral pars defects at L5.

These results will be called to the ordering clinician or
representative by the Radiologist Assistant, and communication
documented in the PACS or [REDACTED].

## 2021-12-27 ENCOUNTER — Other Ambulatory Visit: Payer: Self-pay | Admitting: *Deleted

## 2021-12-27 DIAGNOSIS — I10 Essential (primary) hypertension: Secondary | ICD-10-CM

## 2021-12-27 MED ORDER — AMLODIPINE BESYLATE 5 MG PO TABS: ORAL_TABLET | ORAL | 0 refills | Status: DC

## 2022-01-18 ENCOUNTER — Ambulatory Visit (INDEPENDENT_AMBULATORY_CARE_PROVIDER_SITE_OTHER): Payer: Medicare Other | Admitting: Nurse Practitioner

## 2022-01-18 VITALS — BP 127/83 | HR 65 | Temp 97.3°F | Ht 73.0 in | Wt 181.0 lb

## 2022-01-18 DIAGNOSIS — E785 Hyperlipidemia, unspecified: Secondary | ICD-10-CM | POA: Diagnosis not present

## 2022-01-18 DIAGNOSIS — R351 Nocturia: Secondary | ICD-10-CM

## 2022-01-18 DIAGNOSIS — K219 Gastro-esophageal reflux disease without esophagitis: Secondary | ICD-10-CM

## 2022-01-18 DIAGNOSIS — M7712 Lateral epicondylitis, left elbow: Secondary | ICD-10-CM

## 2022-01-18 DIAGNOSIS — I10 Essential (primary) hypertension: Secondary | ICD-10-CM

## 2022-01-18 DIAGNOSIS — Z720 Tobacco use: Secondary | ICD-10-CM

## 2022-01-18 MED ORDER — PANTOPRAZOLE SODIUM 40 MG PO TBEC: DELAYED_RELEASE_TABLET | ORAL | 1 refills | Status: DC

## 2022-01-18 MED ORDER — ROSUVASTATIN CALCIUM 20 MG PO TABS: ORAL_TABLET | ORAL | 1 refills | Status: DC

## 2022-01-18 MED ORDER — ENALAPRIL MALEATE 10 MG PO TABS: 10.0000 mg | ORAL_TABLET | Freq: Two times a day (BID) | ORAL | 1 refills | Status: DC

## 2022-01-18 MED ORDER — AMLODIPINE BESYLATE 5 MG PO TABS: ORAL_TABLET | ORAL | 1 refills | Status: DC

## 2022-01-18 NOTE — Progress Notes (Unsigned)
Subjective:    Patient ID: Billy Mccormick, male    DOB: 03-16-1956, 66 y.o.   MRN: [id number]  HPI  66 year old male patient presents to the clinic today for follow-up on hypertension.  Patient also has concerns about his left elbow and nighttime urination.  Hypertension Patient states that blood pressure is well controlled at home.  Blood pressures continue to be around 120 over 80s.  Patient denies any low blood pressures.  Lightheadedness, dizziness, chest pain, shortness of breath, swelling in legs.  Patient continues to take amlodipine and enalapril without difficulty.  Hyperlipidemia Patient continues to take Crestor 20 mg without difficulty.  Patient states that he was evaluated by vascular and had a imaging done of his AAA.  Patient states that the vascular provider told him that they would reimage his AAA in 2 years and after that he will not need any more imaging.    Insert left elbow pain Patient states that his left elbow started hurting around January after he was pruning trees outside and then afterwards did bicep curls.  Patient states that the pain is not as bad as it was however it is still there.  Patient states that it hurts to pick up items or lift items.  Patient states that he has been wearing an elbow brace which has helped however the pain is still there and if he tweaks it it hurts.  Patient states that he hurt his right arm similarly about 10 years ago in which he wore a brace for several months and eventually the pain went away.  Patient states that this pain has been with him for a while.  Frequent nighttime urination Patient states that he has been experiencing frequent nighttime urination for several months now.  Patient states that he does not drink as much fluid that he does not tend to go to the bathroom as often however he finds himself dehydrated after he does that.  Patient does admit to drinking a few beers at night as well which she thinks contributes to  his increased urinary frequency. Patient denies any daytime urinary frequency, dysuria, or urgency.   Review of Systems  Musculoskeletal:        Left elbow pain       Objective:   Physical Exam Vitals reviewed.  Constitutional:      General: He is not in acute distress.    Appearance: Normal appearance. He is normal weight. He is not ill-appearing, toxic-appearing or diaphoretic.  HENT:     Head: Normocephalic and atraumatic.  Cardiovascular:     Rate and Rhythm: Normal rate and regular rhythm.     Pulses: Normal pulses.     Heart sounds: Normal heart sounds. No murmur heard. Pulmonary:     Effort: Pulmonary effort is normal. No respiratory distress.     Breath sounds: Normal breath sounds. No wheezing.  Musculoskeletal:     Right elbow: Normal.     Left elbow: No swelling, deformity, effusion or lacerations. Normal range of motion. Tenderness present in lateral epicondyle. No radial head, medial epicondyle or olecranon process tenderness.     Cervical back: Normal range of motion and neck supple. No rigidity or tenderness.     Comments: Grossly intact.  No point tenderness noted to left arm. No swelling, redness.  Pain to lateral epicondyl with resisted wrist extension.  Lymphadenopathy:     Cervical: No cervical adenopathy.  Skin:    General: Skin is warm.  Capillary Refill: Capillary refill takes less than 2 seconds.  Neurological:     Mental Status: He is alert.     Comments: Grossly intact  Psychiatric:        Mood and Affect: Mood normal.        Behavior: Behavior normal.         Assessment & Plan:  1. Hypertension, unspecified type - BP today 127/83. Goal of less than 140/90 met.  - Continue taking medications as prescribed. - amLODipine (NORVASC) 5 MG tablet; TAKE 1 TABLET(5 MG) BY MOUTH DAILY  Dispense: 90 tablet; Refill: 1 - enalapril (VASOTEC) 10 MG tablet; Take 1 tablet (10 mg total) by mouth 2 (two) times daily.  Dispense: 180 tablet; Refill: 1 -  CMP14+EGFR - 6 month follow up   2. Hyperlipidemia with target LDL less than 100 - Continue taking Crestor as prescribed.  - rosuvastatin (CRESTOR) 20 MG tablet; TAKE 1 TABLET BY MOUTH AT BEDTIME  Dispense: 90 tablet; Refill: 1 - Lipid Profile - CMP14+EGFR - 6 month follow up  3. Gastroesophageal reflux disease without esophagitis - Continue to trial Pepcid 68m BID.  - Use Protonix as needed.  - pantoprazole (PROTONIX) 40 MG tablet; TAKE 1 TABLET BY MOUTH EVERY DAY  Dispense: 90 tablet; Refill: 1  4.Epicondylitis Left - Continue to use Ibuprofen as needed. - Continue to use brace - RTC for see Dr. SNicki Reaperof injection   5. Frequent urination at night - Limit from drinking past 5pm.  - Attempt to limit alcohlic beverages past 5 pm - If frequency persist will consider prostate evaluation - RTC in 6 months  6.  Smoking trying to quit - Patient continues to cut down on cigarettes, however has not quit yet. - He continues to smoke 1/2 pack a day and sometimes less than that.  - Patient states that he is comfortable where he currently is now.  - Patient has Wellbutrin but has not used it for smoking cessation. - Patient has Low dose CT schedule in August by Pulmonology.    Note:  This document was prepared using Dragon voice recognition software and may include unintentional dictation errors. Note - This record has been created using [employer]  Chart creation errors have been sought, but may not always  have been located. Such creation errors do not reflect on  the standard of medical care.

## 2022-01-19 ENCOUNTER — Encounter: Payer: Self-pay | Admitting: Nurse Practitioner

## 2022-01-19 LAB — CMP14+EGFR
ALT: 26 IU/L (ref 0–44)
AST: 32 IU/L (ref 0–40)
Albumin/Globulin Ratio: 1.8 (ref 1.2–2.2)
Albumin: 4.6 g/dL (ref 3.8–4.8)
Alkaline Phosphatase: 72 IU/L (ref 44–121)
BUN/Creatinine Ratio: 14 (ref 10–24)
BUN: 13 mg/dL (ref 8–27)
Bilirubin Total: 0.4 mg/dL (ref 0.0–1.2)
CO2: 21 mmol/L (ref 20–29)
Calcium: 9.8 mg/dL (ref 8.6–10.2)
Chloride: 104 mmol/L (ref 96–106)
Creatinine, Ser: 0.92 mg/dL (ref 0.76–1.27)
Globulin, Total: 2.5 g/dL (ref 1.5–4.5)
Glucose: 102 mg/dL — ABNORMAL HIGH (ref 70–99)
Potassium: 4.9 mmol/L (ref 3.5–5.2)
Sodium: 137 mmol/L (ref 134–144)
Total Protein: 7.1 g/dL (ref 6.0–8.5)
eGFR: 92 mL/min/{1.73_m2} (ref >59)

## 2022-01-19 LAB — LIPID PANEL
Chol/HDL Ratio: 2.4 ratio (ref 0.0–5.0)
Cholesterol, Total: 109 mg/dL (ref 100–199)
HDL: 45 mg/dL (ref >39)
LDL Chol Calc (NIH): 50 mg/dL (ref 0–99)
Triglycerides: 66 mg/dL (ref 0–149)
VLDL Cholesterol Cal: 14 mg/dL (ref 5–40)

## 2022-01-27 ENCOUNTER — Ambulatory Visit: Payer: Medicare Other | Admitting: Family Medicine

## 2022-02-14 ENCOUNTER — Ambulatory Visit (HOSPITAL_COMMUNITY)
Admission: RE | Admit: 2022-02-14 | Discharge: 2022-02-14 | Disposition: A | Discharge status: Home / Self Care | Payer: Medicare Other | Source: Ambulatory Visit | Attending: Acute Care | Admitting: Acute Care

## 2022-02-14 DIAGNOSIS — Z87891 Personal history of nicotine dependence: Secondary | ICD-10-CM | POA: Insufficient documentation

## 2022-02-14 DIAGNOSIS — F17210 Nicotine dependence, cigarettes, uncomplicated: Secondary | ICD-10-CM | POA: Insufficient documentation

## 2022-02-15 ENCOUNTER — Other Ambulatory Visit: Payer: Self-pay

## 2022-02-15 DIAGNOSIS — Z87891 Personal history of nicotine dependence: Secondary | ICD-10-CM

## 2022-02-15 DIAGNOSIS — F17210 Nicotine dependence, cigarettes, uncomplicated: Secondary | ICD-10-CM

## 2022-02-15 DIAGNOSIS — Z122 Encounter for screening for malignant neoplasm of respiratory organs: Secondary | ICD-10-CM

## 2022-05-02 ENCOUNTER — Ambulatory Visit (INDEPENDENT_AMBULATORY_CARE_PROVIDER_SITE_OTHER): Payer: Medicare Other | Admitting: Nurse Practitioner

## 2022-05-02 ENCOUNTER — Encounter: Payer: Self-pay | Admitting: Nurse Practitioner

## 2022-05-02 VITALS — BP 132/78 | HR 77 | Temp 98.0°F | Ht 72.0 in | Wt 181.8 lb

## 2022-05-02 DIAGNOSIS — Z0001 Encounter for general adult medical examination with abnormal findings: Secondary | ICD-10-CM

## 2022-05-02 DIAGNOSIS — E785 Hyperlipidemia, unspecified: Secondary | ICD-10-CM | POA: Diagnosis not present

## 2022-05-02 DIAGNOSIS — I7143 Infrarenal abdominal aortic aneurysm, without rupture: Secondary | ICD-10-CM

## 2022-05-02 DIAGNOSIS — Z23 Encounter for immunization: Secondary | ICD-10-CM | POA: Diagnosis not present

## 2022-05-02 DIAGNOSIS — I10 Essential (primary) hypertension: Secondary | ICD-10-CM | POA: Diagnosis not present

## 2022-05-02 DIAGNOSIS — Z720 Tobacco use: Secondary | ICD-10-CM

## 2022-05-02 DIAGNOSIS — Z0000 Encounter for general adult medical examination without abnormal findings: Secondary | ICD-10-CM | POA: Diagnosis not present

## 2022-05-02 DIAGNOSIS — Z114 Encounter for screening for human immunodeficiency virus [HIV]: Secondary | ICD-10-CM

## 2022-05-02 DIAGNOSIS — Z125 Encounter for screening for malignant neoplasm of prostate: Secondary | ICD-10-CM

## 2022-05-02 DIAGNOSIS — R351 Nocturia: Secondary | ICD-10-CM

## 2022-05-02 DIAGNOSIS — Z1159 Encounter for screening for other viral diseases: Secondary | ICD-10-CM

## 2022-05-02 DIAGNOSIS — R7303 Prediabetes: Secondary | ICD-10-CM

## 2022-05-02 NOTE — Progress Notes (Signed)
Subjective:    Patient ID: Billy Mccormick, male    DOB: 11-07-1955, 66 y.o.   MRN: [id number]  HPI AWV- Annual Wellness Visit  The patient was seen for their annual wellness visit. The patient's past medical history, surgical history, and family history were reviewed. Pertinent vaccines were reviewed ( tetanus, pneumonia, shingles, flu) The patient's medication list was reviewed and updated.  The height and weight were entered.  BMI recorded in electronic record elsewhere  Cognitive screening was completed. Outcome of Mini - Cog: pass   Falls /depression screening electronically recorded within record elsewhere  Current tobacco usage:yes (All patients who use tobacco were given written and verbal information on quitting)  Recent listing of emergency department/hospitalizations over the past year were reviewed.  current specialist the patient sees on a regular basis: none   Medicare annual wellness visit patient questionnaire was reviewed.  A written screening schedule for the patient for the next 5-10 years was given. Appropriate discussion of followup regarding next visit was discussed.      Review of Systems  All other systems reviewed and are negative.      Objective:   Physical Exam Vitals reviewed.  Constitutional:      General: He is not in acute distress.    Appearance: Normal appearance. He is normal weight. He is not ill-appearing, toxic-appearing or diaphoretic.  HENT:     Head: Normocephalic and atraumatic.  Cardiovascular:     Rate and Rhythm: Normal rate and regular rhythm.     Pulses: Normal pulses.     Heart sounds: Normal heart sounds. No murmur heard. Pulmonary:     Effort: Pulmonary effort is normal. No respiratory distress.     Breath sounds: Normal breath sounds. No wheezing.  Abdominal:     General: Abdomen is flat. Bowel sounds are normal. There is no distension.     Palpations: Abdomen is soft. There is no mass.     Tenderness: There  is no abdominal tenderness. There is no guarding or rebound.     Hernia: No hernia is present.  Musculoskeletal:     Cervical back: Normal range of motion and neck supple. No rigidity or tenderness.     Comments: Grossly intact  Lymphadenopathy:     Cervical: No cervical adenopathy.  Skin:    General: Skin is warm.     Capillary Refill: Capillary refill takes less than 2 seconds.  Neurological:     Mental Status: He is alert.     Comments: Grossly intact  Psychiatric:        Mood and Affect: Mood normal.        Behavior: Behavior normal.           Assessment & Plan:   1. Hyperlipidemia with target LDL less than 100 -Last LDL in July was 50 -Tight control of LDL due to history of AAA - Lipid Profile ordered today  2. Infrarenal abdominal aortic aneurysm (AAA) without rupture [facility] [place]) -Being followed by cardiology -Next duplex scans due January 2025  3. Hypertension, unspecified type -Blood pressure 132/78.  Within goal of 130/80 -Due to history of AAA believes tighter control of blood pressure warranted -Continue taking amlodipine and enalapril for blood pressure management - CMP14+EGFR  4. Encounter for screening for HIV - HIV antibody (with reflex)  5. Encounter for hepatitis C screening test for low risk patient - Hepatitis C Antibody  6. Prostate cancer screening - PSA  7. Prediabetes - HgB A1c  8. Smoking trying to quit -Patient current smoker. -Recognizes need to quit. -Patient was ordered Wellbutrin during previous visit which she has not tried yet.  Patient states he would like to try Wellbutrin and let provider know if it works.  9. Need for vaccination - Flu Vaccine QUAD High Dose(Fluad)  Follow-up with PCP in 6 months

## 2022-05-06 ENCOUNTER — Encounter: Payer: Self-pay | Admitting: Nurse Practitioner

## 2022-05-24 NOTE — Addendum Note (Signed)
Addended by: Claire Shown on: 05/24/2022 08:28 AM   Modules accepted: Level of Service

## 2022-07-19 ENCOUNTER — Other Ambulatory Visit: Payer: Self-pay

## 2022-07-19 DIAGNOSIS — E785 Hyperlipidemia, unspecified: Secondary | ICD-10-CM

## 2022-07-19 DIAGNOSIS — K219 Gastro-esophageal reflux disease without esophagitis: Secondary | ICD-10-CM

## 2022-07-19 MED ORDER — PANTOPRAZOLE SODIUM 40 MG PO TBEC: DELAYED_RELEASE_TABLET | ORAL | 1 refills | Status: DC

## 2022-07-19 MED ORDER — ROSUVASTATIN CALCIUM 20 MG PO TABS: ORAL_TABLET | ORAL | 1 refills | Status: DC

## 2022-07-24 ENCOUNTER — Ambulatory Visit: Payer: Medicare Other | Admitting: Nurse Practitioner

## 2022-07-27 ENCOUNTER — Other Ambulatory Visit: Payer: Self-pay

## 2022-07-27 DIAGNOSIS — I10 Essential (primary) hypertension: Secondary | ICD-10-CM

## 2022-07-27 MED ORDER — ENALAPRIL MALEATE 10 MG PO TABS: 10.0000 mg | ORAL_TABLET | Freq: Two times a day (BID) | ORAL | 1 refills | Status: DC

## 2022-09-20 ENCOUNTER — Other Ambulatory Visit: Payer: Self-pay | Admitting: *Deleted

## 2022-09-20 ENCOUNTER — Other Ambulatory Visit: Payer: Self-pay | Admitting: Family Medicine

## 2022-09-20 DIAGNOSIS — I10 Essential (primary) hypertension: Secondary | ICD-10-CM

## 2022-09-20 MED ORDER — AMLODIPINE BESYLATE 5 MG PO TABS: ORAL_TABLET | ORAL | 0 refills | Status: DC

## 2022-10-17 ENCOUNTER — Other Ambulatory Visit: Payer: Self-pay | Admitting: Family Medicine

## 2022-10-17 DIAGNOSIS — I10 Essential (primary) hypertension: Secondary | ICD-10-CM

## 2022-10-23 ENCOUNTER — Ambulatory Visit (INDEPENDENT_AMBULATORY_CARE_PROVIDER_SITE_OTHER): Payer: Medicare Other | Admitting: Family Medicine

## 2022-10-23 DIAGNOSIS — E785 Hyperlipidemia, unspecified: Secondary | ICD-10-CM

## 2022-10-23 DIAGNOSIS — Z720 Tobacco use: Secondary | ICD-10-CM

## 2022-10-23 DIAGNOSIS — K219 Gastro-esophageal reflux disease without esophagitis: Secondary | ICD-10-CM

## 2022-10-23 DIAGNOSIS — I10 Essential (primary) hypertension: Secondary | ICD-10-CM

## 2022-10-23 DIAGNOSIS — R7303 Prediabetes: Secondary | ICD-10-CM

## 2022-10-23 DIAGNOSIS — Z8711 Personal history of peptic ulcer disease: Secondary | ICD-10-CM | POA: Insufficient documentation

## 2022-10-23 DIAGNOSIS — M79675 Pain in left toe(s): Secondary | ICD-10-CM

## 2022-10-23 DIAGNOSIS — R972 Elevated prostate specific antigen [PSA]: Secondary | ICD-10-CM

## 2022-10-23 MED ORDER — AMLODIPINE BESYLATE 5 MG PO TABS
ORAL_TABLET | ORAL | 3 refills | Status: DC
Start: 2022-10-23 — End: 2023-10-19

## 2022-10-23 NOTE — Assessment & Plan Note (Signed)
Lipid panel today to assess.  Continue Crestor. 

## 2022-10-23 NOTE — Patient Instructions (Signed)
Referral to podiatry placed.  Labs ordered.  Follow up in 6 months.

## 2022-10-23 NOTE — Assessment & Plan Note (Signed)
Stable.  Continue enalapril and amlodipine.  Amlodipine refilled today.

## 2022-10-23 NOTE — Assessment & Plan Note (Signed)
Stable on Protonix.  Continue. 

## 2022-10-23 NOTE — Progress Notes (Signed)
Subjective:  Patient ID: Billy KosJames D Claude II, male    DOB: 04/25/1956  Age: 67 y.o. MRN: [id number]  CC: Chief Complaint  Patient presents with   Hypertension   pain in left toe    Multiple joints in left foot going on for approx 3 yrs     HPI:  67 year old male with hypertension, AAA status postrepair, GERD, prediabetes, tobacco abuse, hyperlipidemia presents for follow-up.  Patient's blood pressure is well-controlled on amlodipine and enalapril.  Needs refill on amlodipine today.  GERD stable on Protonix.  Patient continues to smoke.  Will discuss smoking cessation today.  Patient reports that he is having issues with his left foot.  He has had ongoing pain of his great toe as well as the fifth toe.  Has been going on for the past 3 years.  He states that seems to be different types of pain.  Great toe seems to be more of an ache whereas the little toe seems to be sharp.  No reports of swelling or redness.  No recent fall, trauma, injury.  Patient Active Problem List   Diagnosis Date Noted   Hyperlipidemia 10/23/2022   Tobacco abuse 10/23/2022   History of gastric ulcer 10/23/2022   Pain of left great toe 10/23/2022   Prediabetes 05/02/2022   AAA (abdominal aortic aneurysm) 04/28/2020   Essential hypertension 10/02/2015   GERD (gastroesophageal reflux disease) 04/27/2014    Social Hx   Social History   Socioeconomic History   Marital status: Married    Spouse name: Not on file   Number of children: 0   Years of education: Not on file   Highest education level: Associate degree: occupational, Scientist, product/process developmenttechnical, or vocational program  Occupational History   Not on file  Tobacco Use   Smoking status: Every Day    Packs/day: 0.50    Years: 35.00    Additional pack years: 0.00    Total pack years: 17.50    Types: Cigarettes   Smokeless tobacco: Never   Tobacco comments:    8 cigarettes per day  Vaping Use   Vaping Use: Never used  Substance and Sexual Activity   Alcohol  use: Yes    Alcohol/week: 10.0 standard drinks of alcohol    Types: 7 Cans of beer, 3 Shots of liquor per week    Comment: 6 beers about 3 days weekly   Drug use: No   Sexual activity: Not on file  Other Topics Concern   Not on file  Social History Narrative   Not on file   Social Determinants of Health   Financial Resource Strain: Low Risk  (10/19/2022)   Overall Financial Resource Strain (CARDIA)    Difficulty of Paying Living Expenses: Not hard at all  Food Insecurity: No Food Insecurity (10/19/2022)   Hunger Vital Sign    Worried About Running Out of Food in the Last Year: Never true    Ran Out of Food in the Last Year: Never true  Transportation Needs: No Transportation Needs (10/19/2022)   PRAPARE - Administrator, Civil ServiceTransportation    Lack of Transportation (Medical): No    Lack of Transportation (Non-Medical): No  Physical Activity: Sufficiently Active (10/19/2022)   Exercise Vital Sign    Days of Exercise per Week: 7 days    Minutes of Exercise per Session: 150+ min  Stress: No Stress Concern Present (10/19/2022)   [employer] - Occupational Stress Questionnaire    Feeling of Stress : Not at all  Social Connections: Unknown (10/19/2022)   Social Connection and Isolation Panel [NHANES]    Frequency of Communication with Friends and Family: Once a week    Frequency of Social Gatherings with Friends and Family: Not on file    Attends Religious Services: Not on file    Active Member of Clubs or Organizations: No    Attends Engineer, structural: Not on file    Marital Status: Married    Review of Systems Per HPI  Objective:  BP 124/86   Pulse 73   Temp 97.7 F (36.5 C)   Ht 6' (1.829 m)   Wt 185 lb (83.9 kg)   SpO2 97%   BMI 25.09 kg/m      10/23/2022    8:35 AM 05/02/2022    9:08 AM 01/18/2022    8:23 AM  BP/Weight  Systolic BP 124 132 127  Diastolic BP 86 78 83  Wt. (Lbs) 185 181.8 181  BMI 25.09 kg/m2 24.66 kg/m2 23.88 kg/m2    Physical  Exam Vitals and nursing note reviewed.  Constitutional:      General: He is not in acute distress.    Appearance: Normal appearance.  HENT:     Head: Normocephalic and atraumatic.  Eyes:     General:        Right eye: No discharge.        Left eye: No discharge.     Conjunctiva/sclera: Conjunctivae normal.  Cardiovascular:     Rate and Rhythm: Normal rate and regular rhythm.  Pulmonary:     Effort: Pulmonary effort is normal.     Breath sounds: Normal breath sounds. No wheezing, rhonchi or rales.  Feet:     Comments: Patient endorses tenderness to palpation at the first MTP joint of the left foot.  No swelling or erythema.  Left fifth toe with no erythema or swelling.  No tenderness to palpation. Neurological:     Mental Status: He is alert.  Psychiatric:        Mood and Affect: Mood normal.        Behavior: Behavior normal.     Lab Results  Component Value Date   WBC 7.1 04/29/2020   HGB 12.7 (L) 04/29/2020   HCT 38.1 (L) 04/29/2020   PLT 185 04/29/2020   GLUCOSE 102 (H) 01/18/2022   CHOL 109 01/18/2022   TRIG 66 01/18/2022   HDL 45 01/18/2022   LDLCALC 50 01/18/2022   ALT 26 01/18/2022   AST 32 01/18/2022   NA 137 01/18/2022   K 4.9 01/18/2022   CL 104 01/18/2022   CREATININE 0.92 01/18/2022   BUN 13 01/18/2022   CO2 21 01/18/2022   PSA 0.71 09/11/2015   INR 1.1 04/28/2020   HGBA1C 5.7 (H) 07/27/2021     Assessment & Plan:   Problem List Items Addressed This Visit       Cardiovascular and Mediastinum   Essential hypertension    Stable.  Continue enalapril and amlodipine.  Amlodipine refilled today.      Relevant Medications   amLODipine (NORVASC) 5 MG tablet     Digestive   GERD (gastroesophageal reflux disease)    Stable on Protonix.  Continue.        Other   Prediabetes   Relevant Orders   Hemoglobin A1c   Tobacco abuse    Discussed use of Wellbutrin, Chantix, and nicotine replacement to aid in smoking cessation.  Patient declines at  this time.  Pain of left great toe    Referring to podiatry.      Relevant Orders   Ambulatory referral to Podiatry   Hyperlipidemia    Lipid panel today to assess.  Continue Crestor.      Relevant Medications   amLODipine (NORVASC) 5 MG tablet   Other Relevant Orders   Lipid panel   Other Visit Diagnoses     Hypertension, unspecified type       Relevant Medications   amLODipine (NORVASC) 5 MG tablet   Other Relevant Orders   CMP14+EGFR   Elevated PSA       Relevant Orders   PSA       Meds ordered this encounter  Medications   amLODipine (NORVASC) 5 MG tablet    Sig: TAKE 1 TABLET(5 MG) BY MOUTH DAILY    Dispense:  90 tablet    Refill:  3    Follow-up:  6 months  Deunte Bledsoe Adriana Simas DO [facility]

## 2022-10-23 NOTE — Assessment & Plan Note (Signed)
Referring to podiatry

## 2022-10-23 NOTE — Assessment & Plan Note (Signed)
Discussed use of Wellbutrin, Chantix, and nicotine replacement to aid in smoking cessation.  Patient declines at this time.

## 2022-10-24 LAB — CMP14+EGFR
ALT: 21 IU/L (ref 0–44)
AST: 23 IU/L (ref 0–40)
Albumin/Globulin Ratio: 1.6 (ref 1.2–2.2)
Albumin: 4.4 g/dL (ref 3.9–4.9)
Alkaline Phosphatase: 77 IU/L (ref 44–121)
BUN/Creatinine Ratio: 16 (ref 10–24)
BUN: 15 mg/dL (ref 8–27)
Bilirubin Total: 0.2 mg/dL (ref 0.0–1.2)
CO2: 18 mmol/L — ABNORMAL LOW (ref 20–29)
Calcium: 9.7 mg/dL (ref 8.6–10.2)
Chloride: 103 mmol/L (ref 96–106)
Creatinine, Ser: 0.95 mg/dL (ref 0.76–1.27)
Globulin, Total: 2.8 g/dL (ref 1.5–4.5)
Glucose: 101 mg/dL — ABNORMAL HIGH (ref 70–99)
Potassium: 4.6 mmol/L (ref 3.5–5.2)
Sodium: 138 mmol/L (ref 134–144)
Total Protein: 7.2 g/dL (ref 6.0–8.5)
eGFR: 88 mL/min/{1.73_m2} (ref >59)

## 2022-10-24 LAB — PSA: Prostate Specific Ag, Serum: 0.7 ng/mL (ref 0.0–4.0)

## 2022-10-24 LAB — LIPID PANEL
Chol/HDL Ratio: 2.8 ratio (ref 0.0–5.0)
Cholesterol, Total: 143 mg/dL (ref 100–199)
HDL: 51 mg/dL (ref >39)
LDL Chol Calc (NIH): 74 mg/dL (ref 0–99)
Triglycerides: 99 mg/dL (ref 0–149)
VLDL Cholesterol Cal: 18 mg/dL (ref 5–40)

## 2022-10-24 LAB — HEMOGLOBIN A1C
Est. average glucose Bld gHb Est-mCnc: 120 mg/dL
Hgb A1c MFr Bld: 5.8 % — ABNORMAL HIGH (ref 4.8–5.6)

## 2022-11-01 ENCOUNTER — Ambulatory Visit (INDEPENDENT_AMBULATORY_CARE_PROVIDER_SITE_OTHER): Payer: Medicare Other

## 2022-11-01 ENCOUNTER — Ambulatory Visit (INDEPENDENT_AMBULATORY_CARE_PROVIDER_SITE_OTHER): Payer: Medicare Other | Admitting: Podiatry

## 2022-11-01 DIAGNOSIS — M25872 Other specified joint disorders, left ankle and foot: Secondary | ICD-10-CM | POA: Diagnosis not present

## 2022-11-01 DIAGNOSIS — M79672 Pain in left foot: Secondary | ICD-10-CM | POA: Diagnosis not present

## 2022-11-01 DIAGNOSIS — M7752 Other enthesopathy of left foot: Secondary | ICD-10-CM

## 2022-11-01 MED ORDER — BETAMETHASONE SOD PHOS & ACET 6 (3-3) MG/ML IJ SUSP
3.0000 mg | Freq: Once | INTRAMUSCULAR | Status: AC
Start: 2022-11-01 — End: 2022-11-01
  Administered 2022-11-01: 3 mg via INTRA_ARTICULAR

## 2022-11-01 MED ORDER — MELOXICAM 15 MG PO TABS
15.0000 mg | ORAL_TABLET | Freq: Every day | ORAL | 1 refills | Status: DC
Start: 2022-11-01 — End: 2022-12-25

## 2022-11-01 NOTE — Progress Notes (Signed)
Chief Complaint  Patient presents with   Foot Pain    Patient came in today for left foot pain, fore foot hallux, started 4 years ago, rate of pain 5 out of 10, feels like stretching the tendon, when walking, X-Rays done today, left foot 5th toe never pain,     HPI: 67 y.o. male presenting today as a new patient for 2 separate complaints.  First the patient states that beginning in September 2023 he began to notice pain and tenderness underneath his great toe.  This has been ongoing for about 8 months now.  Exacerbated when walking uphill or when really flexing and straining his great toe joint. Patient also reports pain and tenderness associated to the left fifth toe.  This has been ongoing for a few months now as well.  Initially began when he developed a blister to the left fifth toe secondary to shoe gear.  The blister resolved but he continues to have sharp shooting pain to the toe.  Alleviated somewhat with wider fitting shoes that do not irritate or constrict the toebox.  Presenting for further treatment and evaluation  Past Medical History:  Diagnosis Date   Degenerative cervical disc    Essential hypertension 10/02/2015   Hyperlipidemia    IFG (impaired fasting glucose)    Lumbar herniated disc 2015   L4,L5   Psoriasis     Past Surgical History:  Procedure Laterality Date   ABDOMINAL AORTIC ENDOVASCULAR STENT GRAFT N/A 04/28/2020   Procedure: ABDOMINAL AORTIC ENDOVASCULAR STENT GRAFT;  Surgeon: Larina Earthly, MD;  Location: [facility];  Service: Vascular;  Laterality: N/A;   ACHILLES TENDON REPAIR     right   COLONOSCOPY  06/25/2006   Dr. Jena Gauss: extensive left-sided diverticula, 2 six to seven mm pale-appearing hyperplastic-appearing polypoid lesions vs atypcial appearing lipoma. (bx-hyperplastic polyps). next TCS 06/2016   COLONOSCOPY N/A 06/21/2017   Procedure: COLONOSCOPY;  Surgeon: Corbin Ade, MD;  [facility];  Service: Endoscopy;  Laterality: N/A;  9:45 AM    ESOPHAGOGASTRODUODENOSCOPY N/A 11/28/2013   ZOX:WRUEAVWUJWJ Schatzki's ring  not manipulated. Small hiatal hernia. Gastric ulcer and erosions as above. Bx antrum-extensive intestinal metaplasia, no H.pylori   ESOPHAGOGASTRODUODENOSCOPY N/A 05/26/2014   Procedure: ESOPHAGOGASTRODUODENOSCOPY (EGD);  Surgeon: Corbin Ade, MD;  [facility];  Service: Endoscopy;  Laterality: N/A;  7:30AM   HERNIA REPAIR     x 2   MALONEY DILATION N/A 11/28/2013   Procedure: MALONEY DILATION;  Surgeon: Corbin Ade, MD;  [facility];  Service: Endoscopy;  Laterality: N/A;   NOSE SURGERY     x 2   POLYPECTOMY  06/21/2017   Procedure: POLYPECTOMY;  Surgeon: Corbin Ade, MD;  [facility];  Service: Endoscopy;;  colon   SAVORY DILATION N/A 11/28/2013   Procedure: SAVORY DILATION;  Surgeon: Corbin Ade, MD;  [facility];  Service: Endoscopy;  Laterality: N/A;   ULTRASOUND GUIDANCE FOR VASCULAR ACCESS Bilateral 04/28/2020   Procedure: ULTRASOUND GUIDANCE FOR VASCULAR ACCESS;  Surgeon: Larina Earthly, MD;  Location: [facility];  Service: Vascular;  Laterality: Bilateral;    Allergies  Allergen Reactions   Prednisone Anxiety     Physical Exam: General: The patient is alert and oriented x3 in no acute distress.  Dermatology: Skin is warm, dry and supple bilateral lower extremities.   Vascular: Palpable pedal pulses bilaterally. Capillary refill within normal limits.  No appreciable edema.  No erythema.  Neurological: Grossly intact via light touch  Musculoskeletal Exam: There is some tenderness with palpation along the sesamoidal apparatus left foot.  Adductovarus hammertoe deformity also noted to the fifth digit left foot with associated tenderness with palpation  Radiographic Exam LT foot 11/01/2022:  Normal osseous mineralization. Joint spaces preserved.  No fractures or osseous irregularities noted.  Assessment/Plan of Care: 1.  Sesamoiditis left foot 2.   Capsulitis fifth digit left foot complicated by adductovarus deformity  -Patient evaluated -Injection of 0.5 cc Celestone Soluspan injected around the fifth toe left -Prescription for meloxicam 15 mg daily -Prefabricated OTC power step insoles were dispensed for the patient to support the medial longitudinal arch of the foot and offload pressure from the great toe joint.  Felt dancers pads were also applied today insole to offload additional pressure from the great toe joint -Return to clinic 4 weeks       Felecia Shelling, DPM [facility]  Dr. Felecia Shelling, DPM    [address] [place], [place] [address]                Office [phone]  Fax [phone]

## 2022-11-13 ENCOUNTER — Other Ambulatory Visit: Payer: Self-pay | Admitting: Podiatry

## 2022-11-13 DIAGNOSIS — M25872 Other specified joint disorders, left ankle and foot: Secondary | ICD-10-CM

## 2022-11-13 DIAGNOSIS — M7752 Other enthesopathy of left foot: Secondary | ICD-10-CM

## 2022-11-13 DIAGNOSIS — M79672 Pain in left foot: Secondary | ICD-10-CM

## 2022-12-04 ENCOUNTER — Ambulatory Visit (INDEPENDENT_AMBULATORY_CARE_PROVIDER_SITE_OTHER): Payer: Medicare Other | Admitting: Podiatry

## 2022-12-04 DIAGNOSIS — M7752 Other enthesopathy of left foot: Secondary | ICD-10-CM

## 2022-12-04 MED ORDER — BETAMETHASONE SOD PHOS & ACET 6 (3-3) MG/ML IJ SUSP
3.0000 mg | Freq: Once | INTRAMUSCULAR | Status: AC
Start: 2022-12-04 — End: 2022-12-04
  Administered 2022-12-04: 3 mg via INTRA_ARTICULAR

## 2022-12-04 NOTE — Progress Notes (Signed)
Chief Complaint  Patient presents with   Toe Pain    Patient came in today for left foot pain follow-up, rate of pain 2 out of 10, patient states he is doing better, TX: power steppes, mobic, injection didn't seem to help     HPI: 67 y.o. male presenting today as a new patient for 2 separate complaints.  First the patient states that beginning in September 2023 he began to notice pain and tenderness underneath his great toe.  This has been ongoing for about 8 months now.  Exacerbated when walking uphill or when really flexing and straining his great toe joint. Patient also reports pain and tenderness associated to the left fifth toe.  This has been ongoing for a few months now as well.  Initially began when he developed a blister to the left fifth toe secondary to shoe gear.  The blister resolved but he continues to have sharp shooting pain to the toe.  Alleviated somewhat with wider fitting shoes that do not irritate or constrict the toebox.  Presenting for further treatment and evaluation  Past Medical History:  Diagnosis Date   Degenerative cervical disc    Essential hypertension 10/02/2015   Hyperlipidemia    IFG (impaired fasting glucose)    Lumbar herniated disc 2015   L4,L5   Psoriasis     Past Surgical History:  Procedure Laterality Date   ABDOMINAL AORTIC ENDOVASCULAR STENT GRAFT N/A 04/28/2020   Procedure: ABDOMINAL AORTIC ENDOVASCULAR STENT GRAFT;  Surgeon: Larina Earthly, MD;  Location: [facility];  Service: Vascular;  Laterality: N/A;   ACHILLES TENDON REPAIR     right   COLONOSCOPY  06/25/2006   Dr. Jena Gauss: extensive left-sided diverticula, 2 six to seven mm pale-appearing hyperplastic-appearing polypoid lesions vs atypcial appearing lipoma. (bx-hyperplastic polyps). next TCS 06/2016   COLONOSCOPY N/A 06/21/2017   Procedure: COLONOSCOPY;  Surgeon: Corbin Ade, MD;  [facility];  Service: Endoscopy;  Laterality: N/A;  9:45 AM   ESOPHAGOGASTRODUODENOSCOPY N/A  11/28/2013   ZOX:WRUEAVWUJWJ Schatzki's ring  not manipulated. Small hiatal hernia. Gastric ulcer and erosions as above. Bx antrum-extensive intestinal metaplasia, no H.pylori   ESOPHAGOGASTRODUODENOSCOPY N/A 05/26/2014   Procedure: ESOPHAGOGASTRODUODENOSCOPY (EGD);  Surgeon: Corbin Ade, MD;  [facility];  Service: Endoscopy;  Laterality: N/A;  7:30AM   HERNIA REPAIR     x 2   MALONEY DILATION N/A 11/28/2013   Procedure: MALONEY DILATION;  Surgeon: Corbin Ade, MD;  [facility];  Service: Endoscopy;  Laterality: N/A;   NOSE SURGERY     x 2   POLYPECTOMY  06/21/2017   Procedure: POLYPECTOMY;  Surgeon: Corbin Ade, MD;  [facility];  Service: Endoscopy;;  colon   SAVORY DILATION N/A 11/28/2013   Procedure: SAVORY DILATION;  Surgeon: Corbin Ade, MD;  [facility];  Service: Endoscopy;  Laterality: N/A;   ULTRASOUND GUIDANCE FOR VASCULAR ACCESS Bilateral 04/28/2020   Procedure: ULTRASOUND GUIDANCE FOR VASCULAR ACCESS;  Surgeon: Larina Earthly, MD;  Location: [facility];  Service: Vascular;  Laterality: Bilateral;    Allergies  Allergen Reactions   Prednisone Anxiety     Physical Exam: General: The patient is alert and oriented x3 in no acute distress.  Dermatology: Skin is warm, dry and supple bilateral lower extremities.   Vascular: Palpable pedal pulses bilaterally. Capillary refill within normal limits.  No appreciable edema.  No erythema.  Neurological: Grossly intact via light touch  Musculoskeletal Exam: There is some tenderness with palpation along the sesamoidal apparatus left foot.  Adductovarus hammertoe deformity also noted to the fifth digit left foot with associated tenderness with palpation  Radiographic Exam LT foot 11/01/2022:  Normal osseous mineralization. Joint spaces preserved.  No fractures or osseous irregularities noted.  Assessment/Plan of Care: 1.  Sesamoiditis left foot; minimally symptomatic 2.   Capsulitis fifth digit left foot complicated by adductovarus deformity  -Patient evaluated -Injection of 0.5 cc Celestone Soluspan injected around the lateral aspect of the fifth toe left - Continue meloxicam 15 mg daily - Patient states that the prefabricated PowerStep insoles are too bulky to fit in his shoes.  Recommend a thinner insole to support the medial longitudinal arch of the foot.  Consider going to [employer] or the shoe market on [address]. -Return to clinic 4 weeks      Felecia Shelling, DPM [facility]  Dr. Felecia Shelling, DPM    [address] [place], [place] [address]                Office [phone]  Fax [phone]

## 2022-12-04 NOTE — Patient Instructions (Signed)
[employer] running store off of [address] in [place]  [employer] on [address].

## 2022-12-14 ENCOUNTER — Other Ambulatory Visit: Payer: Self-pay | Admitting: Acute Care

## 2022-12-14 DIAGNOSIS — F17210 Nicotine dependence, cigarettes, uncomplicated: Secondary | ICD-10-CM

## 2022-12-14 DIAGNOSIS — Z122 Encounter for screening for malignant neoplasm of respiratory organs: Secondary | ICD-10-CM

## 2022-12-14 DIAGNOSIS — Z87891 Personal history of nicotine dependence: Secondary | ICD-10-CM

## 2022-12-24 ENCOUNTER — Other Ambulatory Visit: Payer: Self-pay | Admitting: Podiatry

## 2023-01-08 ENCOUNTER — Ambulatory Visit: Payer: Medicare Other | Admitting: Podiatry

## 2023-01-13 ENCOUNTER — Other Ambulatory Visit: Payer: Self-pay | Admitting: Family Medicine

## 2023-01-13 DIAGNOSIS — E785 Hyperlipidemia, unspecified: Secondary | ICD-10-CM

## 2023-01-13 DIAGNOSIS — K219 Gastro-esophageal reflux disease without esophagitis: Secondary | ICD-10-CM

## 2023-01-19 ENCOUNTER — Other Ambulatory Visit: Payer: Self-pay | Admitting: Family Medicine

## 2023-01-19 DIAGNOSIS — I10 Essential (primary) hypertension: Secondary | ICD-10-CM

## 2023-02-07 ENCOUNTER — Ambulatory Visit: Payer: Medicare Other | Admitting: Podiatry

## 2023-02-16 ENCOUNTER — Ambulatory Visit (HOSPITAL_COMMUNITY)
Admission: RE | Admit: 2023-02-16 | Discharge: 2023-02-16 | Disposition: A | Discharge status: Home / Self Care | Payer: Medicare Other | Source: Ambulatory Visit | Attending: Acute Care | Admitting: Acute Care

## 2023-02-16 DIAGNOSIS — Z87891 Personal history of nicotine dependence: Secondary | ICD-10-CM

## 2023-02-16 DIAGNOSIS — F17210 Nicotine dependence, cigarettes, uncomplicated: Secondary | ICD-10-CM | POA: Diagnosis present

## 2023-02-16 DIAGNOSIS — Z122 Encounter for screening for malignant neoplasm of respiratory organs: Secondary | ICD-10-CM | POA: Diagnosis not present

## 2023-02-23 ENCOUNTER — Other Ambulatory Visit: Payer: Self-pay | Admitting: Acute Care

## 2023-02-23 DIAGNOSIS — Z87891 Personal history of nicotine dependence: Secondary | ICD-10-CM

## 2023-02-23 DIAGNOSIS — Z122 Encounter for screening for malignant neoplasm of respiratory organs: Secondary | ICD-10-CM

## 2023-02-23 DIAGNOSIS — F17210 Nicotine dependence, cigarettes, uncomplicated: Secondary | ICD-10-CM

## 2023-05-04 ENCOUNTER — Ambulatory Visit: Payer: Medicare Other | Admitting: Family Medicine

## 2023-05-09 ENCOUNTER — Ambulatory Visit: Payer: Medicare Other | Admitting: Family Medicine

## 2023-05-30 ENCOUNTER — Ambulatory Visit: Payer: Medicare Other | Admitting: Family Medicine

## 2023-05-30 DIAGNOSIS — I10 Essential (primary) hypertension: Secondary | ICD-10-CM | POA: Diagnosis not present

## 2023-05-30 DIAGNOSIS — I2510 Atherosclerotic heart disease of native coronary artery without angina pectoris: Secondary | ICD-10-CM | POA: Diagnosis not present

## 2023-05-30 DIAGNOSIS — F430 Acute stress reaction: Secondary | ICD-10-CM | POA: Diagnosis not present

## 2023-05-30 DIAGNOSIS — J432 Centrilobular emphysema: Secondary | ICD-10-CM

## 2023-05-30 DIAGNOSIS — J449 Chronic obstructive pulmonary disease, unspecified: Secondary | ICD-10-CM | POA: Insufficient documentation

## 2023-05-30 DIAGNOSIS — Z23 Encounter for immunization: Secondary | ICD-10-CM

## 2023-05-30 DIAGNOSIS — Z720 Tobacco use: Secondary | ICD-10-CM

## 2023-05-30 DIAGNOSIS — E785 Hyperlipidemia, unspecified: Secondary | ICD-10-CM

## 2023-05-30 MED ORDER — CLONAZEPAM 0.5 MG PO TABS: 0.5000 mg | ORAL_TABLET | Freq: Two times a day (BID) | ORAL | 1 refills | Status: DC | PRN

## 2023-05-30 NOTE — Patient Instructions (Addendum)
Medication as prescribed.  Referral placed.  Follow up in 6 months.

## 2023-05-30 NOTE — Assessment & Plan Note (Signed)
Referring for counseling.  Klonopin as directed.

## 2023-05-30 NOTE — Assessment & Plan Note (Signed)
Declines treatment for smoking cessation.

## 2023-05-30 NOTE — Assessment & Plan Note (Signed)
Fairly well controlled. Continue Crestor.

## 2023-05-30 NOTE — Assessment & Plan Note (Signed)
Advised to monitor his blood pressures closely at home.  He is normally well-controlled.  Continue amlodipine and enalapril.

## 2023-05-30 NOTE — Assessment & Plan Note (Signed)
No current shortness of breath.

## 2023-05-30 NOTE — Progress Notes (Signed)
Subjective:  Patient ID: Billy Mccormick, male    DOB: Sep 02, 1955  Age: 67 y.o. MRN: [id number]  CC: Follow-up   HPI:  67 year old male with the below mentioned medical problems presents for follow-up.  Patient continues to smoke.  He is not interested in quitting.  He is amenable to his flu shot and his pneumococcal vaccine today.  CAD and COPD noted on CT lung cancer screening.  Denies chest pain or shortness of breath at this time.  BP elevated here today.  Patient attributes this to stress.  Blood pressure is normally well-controlled on amlodipine and enalapril.  Patient reports that his mother is in the hospital.  She has had multiple hospital admissions and has been struggling with hyponatremia and poor dietary intake.  He is very stressed regarding this.  He states that he is interested in seeing a counselor as he is having trouble coping with this.  Patient Active Problem List   Diagnosis Date Noted   CAD (coronary artery disease) 05/30/2023   COPD (chronic obstructive pulmonary disease) (HCC) 05/30/2023   Acute reaction to stress 05/30/2023   Hyperlipidemia 10/23/2022   Tobacco abuse 10/23/2022   History of gastric ulcer 10/23/2022   Prediabetes 05/02/2022   AAA (abdominal aortic aneurysm) (HCC) 04/28/2020   Essential hypertension 10/02/2015   GERD (gastroesophageal reflux disease) 04/27/2014    Social Hx   Social History   Socioeconomic History   Marital status: Married    Spouse name: Not on file   Number of children: 0   Years of education: Not on file   Highest education level: Associate degree: occupational, Scientist, product/process development, or vocational program  Occupational History   Not on file  Tobacco Use   Smoking status: Every Day    Current packs/day: 0.50    Average packs/day: 0.5 packs/day for 35.0 years (17.5 ttl pk-yrs)    Types: Cigarettes   Smokeless tobacco: Never   Tobacco comments:    8 cigarettes per day  Vaping Use   Vaping status: Never Used   Substance and Sexual Activity   Alcohol use: Yes    Alcohol/week: 10.0 standard drinks of alcohol    Types: 7 Cans of beer, 3 Shots of liquor per week    Comment: 6 beers about 3 days weekly   Drug use: No   Sexual activity: Not on file  Other Topics Concern   Not on file  Social History Narrative   Not on file   Social Determinants of Health   Financial Resource Strain: Low Risk  (10/19/2022)   Overall Financial Resource Strain (CARDIA)    Difficulty of Paying Living Expenses: Not hard at all  Food Insecurity: No Food Insecurity (10/19/2022)   Hunger Vital Sign    Worried About Running Out of Food in the Last Year: Never true    Ran Out of Food in the Last Year: Never true  Transportation Needs: No Transportation Needs (10/19/2022)   PRAPARE - Administrator, Civil Service (Medical): No    Lack of Transportation (Non-Medical): No  Physical Activity: Sufficiently Active (10/19/2022)   Exercise Vital Sign    Days of Exercise per Week: 7 days    Minutes of Exercise per Session: 150+ min  Stress: No Stress Concern Present (10/19/2022)   [employer] of Occupational Health - Occupational Stress Questionnaire    Feeling of Stress : Not at all  Social Connections: Unknown (10/19/2022)   Social Connection and Isolation Panel [NHANES]  Frequency of Communication with Friends and Family: Once a week    Frequency of Social Gatherings with Friends and Family: Not on file    Attends Religious Services: Not on Marketing executive or Organizations: No    Attends Engineer, structural: Not on file    Marital Status: Married    Review of Systems Per HPI  Objective:  BP (!) 158/88   Pulse 92   Temp (!) 97.4 F (36.3 C)   Ht 6' (1.829 m)   Wt 181 lb 12.8 oz (82.5 kg)   SpO2 99%   BMI 24.66 kg/m      05/30/2023    8:47 AM 10/23/2022    8:35 AM 05/02/2022    9:08 AM  BP/Weight  Systolic BP 158 124 132  Diastolic BP 88 86 78  Wt. (Lbs) 181.8 185  181.8  BMI 24.66 kg/m2 25.09 kg/m2 24.66 kg/m2    Physical Exam Vitals and nursing note reviewed.  Constitutional:      General: He is not in acute distress.    Appearance: Normal appearance.  HENT:     Head: Normocephalic and atraumatic.  Eyes:     General:        Right eye: No discharge.        Left eye: No discharge.     Conjunctiva/sclera: Conjunctivae normal.  Cardiovascular:     Rate and Rhythm: Normal rate and regular rhythm.  Pulmonary:     Effort: Pulmonary effort is normal.     Breath sounds: Normal breath sounds. No wheezing, rhonchi or rales.  Neurological:     Mental Status: He is alert.  Psychiatric:     Comments: Tearful.     Lab Results  Component Value Date   WBC 7.1 04/29/2020   HGB 12.7 (L) 04/29/2020   HCT 38.1 (L) 04/29/2020   PLT 185 04/29/2020   GLUCOSE 101 (H) 10/23/2022   CHOL 143 10/23/2022   TRIG 99 10/23/2022   HDL 51 10/23/2022   LDLCALC 74 10/23/2022   ALT 21 10/23/2022   AST 23 10/23/2022   NA 138 10/23/2022   K 4.6 10/23/2022   CL 103 10/23/2022   CREATININE 0.95 10/23/2022   BUN 15 10/23/2022   CO2 18 (L) 10/23/2022   PSA 0.71 09/11/2015   INR 1.1 04/28/2020   HGBA1C 5.8 (H) 10/23/2022     Assessment & Plan:   Problem List Items Addressed This Visit       Cardiovascular and Mediastinum   Essential hypertension    Advised to monitor his blood pressures closely at home.  He is normally well-controlled.  Continue amlodipine and enalapril.      CAD (coronary artery disease)     Respiratory   COPD (chronic obstructive pulmonary disease) (HCC)    No current shortness of breath.        Other   Tobacco abuse    Declines treatment for smoking cessation.      Hyperlipidemia    Fairly well-controlled.  Continue Crestor.      Acute reaction to stress    Referring for counseling.  Klonopin as directed.      Relevant Orders   Ambulatory referral to Psychology   Other Visit Diagnoses     Immunization due        Relevant Orders   Flu Vaccine Trivalent High Dose (Fluad) (Completed)   Pneumococcal conjugate vaccine 20-valent (Prevnar 20) (Completed)  Meds ordered this encounter  Medications   clonazePAM (KLONOPIN) 0.5 MG tablet    Sig: Take 1 tablet (0.5 mg total) by mouth 2 (two) times daily as needed for anxiety.    Dispense:  20 tablet    Refill:  1    Follow-up:  Return in about 6 months (around 11/27/2023).  Everlene Other DO [facility]

## 2023-07-06 ENCOUNTER — Other Ambulatory Visit: Payer: Self-pay | Admitting: Family Medicine

## 2023-07-06 DIAGNOSIS — K219 Gastro-esophageal reflux disease without esophagitis: Secondary | ICD-10-CM

## 2023-07-06 DIAGNOSIS — E785 Hyperlipidemia, unspecified: Secondary | ICD-10-CM

## 2023-07-14 ENCOUNTER — Other Ambulatory Visit: Payer: Self-pay | Admitting: Family Medicine

## 2023-07-14 DIAGNOSIS — I10 Essential (primary) hypertension: Secondary | ICD-10-CM

## 2023-07-17 ENCOUNTER — Ambulatory Visit: Payer: Medicare Other | Admitting: Behavioral Health

## 2023-07-17 DIAGNOSIS — F4322 Adjustment disorder with anxiety: Secondary | ICD-10-CM | POA: Diagnosis not present

## 2023-07-17 DIAGNOSIS — F430 Acute stress reaction: Secondary | ICD-10-CM

## 2023-07-17 NOTE — Progress Notes (Signed)
 [facility] Counselor Initial Adult Exam  Name: Billy Mccormick Date: 07/17/2023 MRN: [id number] DOB: 02-12-1956 PCP: Bluford Jacqulyn MATSU, DO  Time spent: 60 min In Person @ [facility] Office Time In: 9:00am Time Out: 10:00am  Guardian/Payee:  Medicare Part A & B    Paperwork requested: No   Reason for Visit /Presenting Problem: Elevated anx/dep & stress due to recent decline of Mother's health since Sept 2024  Mental Status Exam: Appearance:   Casual     Behavior:  Appropriate and Sharing  Motor:  Normal  Speech/Language:   Clear and Coherent and Normal Rate  Affect:  Appropriate  Mood:  normal  Thought process:  normal  Thought content:    WNL  Sensory/Perceptual disturbances:    WNL  Orientation:  oriented to person, place, time/date, and situation  Attention:  Good  Concentration:  Good  Memory:  WNL  Fund of knowledge:   Good  Insight:    Good  Judgment:   Good  Impulse Control:  Good    Risk Assessment: Danger to Self:  No Self-injurious Behavior: No Danger to Others: No Duty to Warn:no Physical Aggression / Violence:No  Access to Firearms a concern: No  Gang Involvement:No  Patient / guardian was educated about steps to take if suicide or homicide risk level increases between visits: yes; appropriate to ICD process While future psychiatric events cannot be accurately predicted, the patient does not currently require acute inpatient psychiatric care and does not currently meet [place]  involuntary commitment criteria.  Substance Abuse History: Current substance abuse: No     Past Psychiatric History:   No previous psychological problems have been observed Outpatient Providers: Jayce Cook, DO History of Psych Hospitalization: No  Psychological Testing:  NA    Abuse History:  Victim of: No.,  NA    Report needed: No. Victim of Neglect:No. Perpetrator of  NA   Witness / Exposure to Domestic Violence: No   Protective Services  Involvement: No  Witness to [employer] Violence:  No   Family History:  Family History  Problem Relation Age of Onset   Hyperlipidemia Mother    Hyperlipidemia Father    Kidney disease Brother    Colon cancer Neg Hx     Living situation: the patient lives with their spouse  Sexual Orientation: Straight  Relationship Status: married  Name of spouse / other: Freddy who is 71yo & carries some anxiety If a parent, number of children / ages:StepDtr Lyndy who is 46yo & 2 Software Engineer; 67yo (f) Charlie & 67yo (f) Ryan. Bebe is preg & due in March 2025 w/his first Great Gchild.  Support Systems: spouse, friends  Surveyor, Quantity Stress:  No   Income/Employment/Disability: Neurosurgeon: No   Educational History: Education: engineer, maintenance (it); worked as an Art Gallery Manager for [employer] Hx w/[employer] on both sides of the wall - purchasing/processing of the tobacco & production of cigarettes  Religion/Sprituality/World View: Unk  Any cultural differences that may affect / interfere with treatment:  None noted today  Recreation/Hobbies: Pt stays busy caring for 3 homes; 2 on his 30 acre property & his Mother's home of 40+ yrs  Stressors: Other: Mother's health has been in steep decline since Sept 2024; she has been to the ED @ [facility] 15 times, UC 4 times & Hosp'd 3 times during the past 3 mos. Pt exp'd 2 panic/anx attacks in Sept & Oct. Once he accepted he could not force his  Mother to eat, he has not exp'g any further episodes. The 2 episodes he exp'd invld his, knees feeling unsteady & shaking all over. The other time was worse when his brain felt like he had received an injection of some kind.    Strengths: Supportive Relationships, Family, Friends, Hopefulness, Self Advocate, Able to Communicate Effectively, and strong character that is level-headed. Pt is a gifted Caregiver & is handling his Mother's Home Sale w/determination. He is taking his Mother from  [facility] & they will tgthr help her choose some items that she wants to keep.   Barriers:  The trajectory of events that may pose uncertainty for Pt in the coming months   Legal History: Pending legal issue / charges: The patient has no significant history of legal issues. History of legal issue / charges:  NA  Medical History/Surgical History: reviewed Past Medical History:  Diagnosis Date   Degenerative cervical disc    Essential hypertension 10/02/2015   Hyperlipidemia    IFG (impaired fasting glucose)    Lumbar herniated disc 2015   L4,L5   Psoriasis     Past Surgical History:  Procedure Laterality Date   ABDOMINAL AORTIC ENDOVASCULAR STENT GRAFT N/A 04/28/2020   Procedure: ABDOMINAL AORTIC ENDOVASCULAR STENT GRAFT;  Surgeon: Oris Krystal FALCON, MD;  Location: [facility] OR;  Service: Vascular;  Laterality: N/A;   ACHILLES TENDON REPAIR     right   COLONOSCOPY  06/25/2006   Dr. Shaaron: extensive left-sided diverticula, 2 six to seven mm pale-appearing hyperplastic-appearing polypoid lesions vs atypcial appearing lipoma. (bx-hyperplastic polyps). next TCS 06/2016   COLONOSCOPY N/A 06/21/2017   Procedure: COLONOSCOPY;  Surgeon: Shaaron Lamar HERO, MD;  [facility]: [facility];  Service: Endoscopy;  Laterality: N/A;  9:45 AM   ESOPHAGOGASTRODUODENOSCOPY N/A 11/28/2013   MFM:Wnwrmpuprjo Schatzki's ring  not manipulated. Small hiatal hernia. Gastric ulcer and erosions as above. Bx antrum-extensive intestinal metaplasia, no H.pylori   ESOPHAGOGASTRODUODENOSCOPY N/A 05/26/2014   Procedure: ESOPHAGOGASTRODUODENOSCOPY (EGD);  Surgeon: Lamar HERO Shaaron, MD;  [facility];  Service: Endoscopy;  Laterality: N/A;  7:30AM   HERNIA REPAIR     x 2   MALONEY DILATION N/A 11/28/2013   Procedure: MALONEY DILATION;  Surgeon: Lamar HERO Shaaron, MD;  [facility];  Service: Endoscopy;  Laterality: N/A;   NOSE SURGERY     x 2   POLYPECTOMY  06/21/2017   Procedure:  POLYPECTOMY;  Surgeon: Shaaron Lamar HERO, MD;  [facility];  Service: Endoscopy;;  colon   SAVORY DILATION N/A 11/28/2013   Procedure: SAVORY DILATION;  Surgeon: Lamar HERO Shaaron, MD;  [facility];  Service: Endoscopy;  Laterality: N/A;   ULTRASOUND GUIDANCE FOR VASCULAR ACCESS Bilateral 04/28/2020   Procedure: ULTRASOUND GUIDANCE FOR VASCULAR ACCESS;  Surgeon: Oris Krystal FALCON, MD;  Location: [facility] OR;  Service: Vascular;  Laterality: Bilateral;    Medications: Current Outpatient Medications  Medication Sig Dispense Refill   amLODipine  (NORVASC ) 5 MG tablet TAKE 1 TABLET(5 MG) BY MOUTH DAILY 90 tablet 3   aspirin  EC 81 MG tablet Take 81 mg by mouth daily. Swallow whole.      clonazePAM  (KLONOPIN ) 0.5 MG tablet Take 1 tablet (0.5 mg total) by mouth 2 (two) times daily as needed for anxiety. 20 tablet 1   enalapril  (VASOTEC ) 10 MG tablet TAKE 1 TABLET(10 MG) BY MOUTH TWICE DAILY 180 tablet 1   pantoprazole  (PROTONIX ) 40 MG tablet TAKE 1 TABLET BY MOUTH EVERY  DAY 90 tablet 1   rosuvastatin  (CRESTOR ) 20 MG tablet TAKE 1 TABLET BY MOUTH AT BEDTIME 90 tablet 1   No current facility-administered medications for this visit.    Allergies  Allergen Reactions   Prednisone Anxiety    Diagnoses:  Adjustment disorder with anxious mood  Acute reaction to stress  Plan of Care: Rolan is seeking the exp of psychotherapy today so he can be proactive about his mental health. He realizes he may need outside support other than Family & Friends in the next few months. Today was an introductory session & he has made a positive adjustment to the events of the past 3 mos. He will use this session to get estb'd w/Clinician & then schedule appts prn.  Target Date: 08/16/2023  Progress: 5  Frequency: Once monthly or TBD by Pt prn  Modality: Kennis Richerd LITTIE Hollace, LMFT

## 2023-07-17 NOTE — Progress Notes (Signed)
   Deneise Lever, LMFT

## 2023-10-18 ENCOUNTER — Other Ambulatory Visit: Payer: Self-pay | Admitting: Family Medicine

## 2023-10-18 DIAGNOSIS — I10 Essential (primary) hypertension: Secondary | ICD-10-CM

## 2023-11-29 ENCOUNTER — Encounter: Payer: Self-pay | Admitting: Family Medicine

## 2023-11-29 ENCOUNTER — Ambulatory Visit (INDEPENDENT_AMBULATORY_CARE_PROVIDER_SITE_OTHER): Payer: Medicare Other | Admitting: Family Medicine

## 2023-11-29 VITALS — BP 146/84 | HR 72 | Temp 98.4°F | Ht 72.0 in | Wt 184.0 lb

## 2023-11-29 DIAGNOSIS — R7303 Prediabetes: Secondary | ICD-10-CM | POA: Diagnosis not present

## 2023-11-29 DIAGNOSIS — E785 Hyperlipidemia, unspecified: Secondary | ICD-10-CM | POA: Diagnosis not present

## 2023-11-29 DIAGNOSIS — Z862 Personal history of diseases of the blood and blood-forming organs and certain disorders involving the immune mechanism: Secondary | ICD-10-CM

## 2023-11-29 DIAGNOSIS — R972 Elevated prostate specific antigen [PSA]: Secondary | ICD-10-CM

## 2023-11-29 DIAGNOSIS — I10 Essential (primary) hypertension: Secondary | ICD-10-CM

## 2023-11-29 DIAGNOSIS — K219 Gastro-esophageal reflux disease without esophagitis: Secondary | ICD-10-CM

## 2023-11-29 MED ORDER — ROSUVASTATIN CALCIUM 20 MG PO TABS: 20.0000 mg | ORAL_TABLET | Freq: Every day | ORAL | 3 refills | Status: DC

## 2023-11-29 MED ORDER — PANTOPRAZOLE SODIUM 40 MG PO TBEC: 40.0000 mg | DELAYED_RELEASE_TABLET | Freq: Every day | ORAL | 3 refills | Status: DC

## 2023-11-29 NOTE — Assessment & Plan Note (Signed)
 Stable on Protonix.

## 2023-11-29 NOTE — Progress Notes (Signed)
 Subjective:  Patient ID: Billy Mccormick, male    DOB: 10-06-1955  Age: 68 y.o. MRN: [id number]  CC:   Chief Complaint  Patient presents with   Follow-up    6 month f/u COPD, Hypertension, CAD     HPI:  68 year old male with below mentioned medical problems presents for follow-up.  Patient's blood pressure mildly elevated here today.  He is on amlodipine  and enalapril .  Lipids have been fairly well-controlled.  Last LDL was 74.  Compliant with rosuvastatin .  GERD stable on Protonix .  Patient Active Problem List   Diagnosis Date Noted   CAD (coronary artery disease) 05/30/2023   COPD (chronic obstructive pulmonary disease) (HCC) 05/30/2023   Acute reaction to stress 05/30/2023   Hyperlipidemia 10/23/2022   Tobacco abuse 10/23/2022   History of gastric ulcer 10/23/2022   Prediabetes 05/02/2022   AAA (abdominal aortic aneurysm) (HCC) 04/28/2020   Essential hypertension 10/02/2015   GERD (gastroesophageal reflux disease) 04/27/2014    Social Hx   Social History   Socioeconomic History   Marital status: Married    Spouse name: Not on file   Number of children: 0   Years of education: Not on file   Highest education level: Associate degree: occupational, Scientist, product/process development, or vocational program  Occupational History   Not on file  Tobacco Use   Smoking status: Every Day    Current packs/day: 0.50    Average packs/day: 0.5 packs/day for 35.0 years (17.5 ttl pk-yrs)    Types: Cigarettes   Smokeless tobacco: Never   Tobacco comments:    8 cigarettes per day  Vaping Use   Vaping status: Never Used  Substance and Sexual Activity   Alcohol use: Yes    Alcohol/week: 10.0 standard drinks of alcohol    Types: 7 Cans of beer, 3 Shots of liquor per week    Comment: 6 beers about 3 days weekly   Drug use: No   Sexual activity: Not on file  Other Topics Concern   Not on file  Social History Narrative   Not on file   Social Drivers of Health   Financial Resource Strain:  Low Risk  (10/19/2022)   Overall Financial Resource Strain (CARDIA)    Difficulty of Paying Living Expenses: Not hard at all  Food Insecurity: No Food Insecurity (10/19/2022)   Hunger Vital Sign    Worried About Running Out of Food in the Last Year: Never true    Ran Out of Food in the Last Year: Never true  Transportation Needs: No Transportation Needs (10/19/2022)   PRAPARE - Administrator, Civil Service (Medical): No    Lack of Transportation (Non-Medical): No  Physical Activity: Sufficiently Active (10/19/2022)   Exercise Vital Sign    Days of Exercise per Week: 7 days    Minutes of Exercise per Session: 150+ min  Stress: No Stress Concern Present (10/19/2022)   [employer] of Occupational Health - Occupational Stress Questionnaire    Feeling of Stress : Not at all  Social Connections: Unknown (10/19/2022)   Social Connection and Isolation Panel [NHANES]    Frequency of Communication with Friends and Family: Once a week    Frequency of Social Gatherings with Friends and Family: Not on file    Attends Religious Services: Not on file    Active Member of Clubs or Organizations: No    Attends Banker Meetings: Not on file    Marital Status: Married    Review  of Systems  Constitutional: Negative.   Respiratory: Negative.    Cardiovascular: Negative.    Objective:  BP (!) 146/84   Pulse 72   Temp 98.4 F (36.9 C)   Ht 6' (1.829 m)   Wt 184 lb (83.5 kg)   SpO2 97%   BMI 24.95 kg/m      11/29/2023    8:33 AM 05/30/2023    8:47 AM 10/23/2022    8:35 AM  BP/Weight  Systolic BP 146 158 124  Diastolic BP 84 88 86  Wt. (Lbs) 184 181.8 185  BMI 24.95 kg/m2 24.66 kg/m2 25.09 kg/m2    Physical Exam Vitals and nursing note reviewed.  Constitutional:      General: He is not in acute distress.    Appearance: Normal appearance.  HENT:     Head: Normocephalic and atraumatic.  Eyes:     General:        Right eye: No discharge.        Left eye: No  discharge.     Conjunctiva/sclera: Conjunctivae normal.  Cardiovascular:     Rate and Rhythm: Normal rate and regular rhythm.  Pulmonary:     Effort: Pulmonary effort is normal.     Breath sounds: Normal breath sounds. No wheezing, rhonchi or rales.  Neurological:     Mental Status: He is alert.  Psychiatric:        Mood and Affect: Mood normal.        Behavior: Behavior normal.     Lab Results  Component Value Date   WBC 7.1 04/29/2020   HGB 12.7 (L) 04/29/2020   HCT 38.1 (L) 04/29/2020   PLT 185 04/29/2020   GLUCOSE 101 (H) 10/23/2022   CHOL 143 10/23/2022   TRIG 99 10/23/2022   HDL 51 10/23/2022   LDLCALC 74 10/23/2022   ALT 21 10/23/2022   AST 23 10/23/2022   NA 138 10/23/2022   K 4.6 10/23/2022   CL 103 10/23/2022   CREATININE 0.95 10/23/2022   BUN 15 10/23/2022   CO2 18 (L) 10/23/2022   PSA 0.71 09/11/2015   INR 1.1 04/28/2020   HGBA1C 5.8 (H) 10/23/2022     Assessment & Plan:  Essential hypertension Assessment & Plan: Stable. Continue current medication.   Orders: -     CMP14+EGFR  Prediabetes -     Hemoglobin A1c -     Microalbumin / creatinine urine ratio  Hyperlipidemia, unspecified hyperlipidemia type Assessment & Plan: Stable. Lipid panel today. Continue Crestor .   Orders: -     Lipid panel -     Rosuvastatin  Calcium ; Take 1 tablet (20 mg total) by mouth at bedtime.  Dispense: 90 tablet; Refill: 3  History of anemia -     CBC  Elevated PSA -     PSA  Gastroesophageal reflux disease without esophagitis Assessment & Plan: Stable on Protonix .  Orders: -     Pantoprazole  Sodium; Take 1 tablet (40 mg total) by mouth daily.  Dispense: 90 tablet; Refill: 3    Follow-up:  6 months  Benicio Manna Debrah Fan DO [facility]

## 2023-11-29 NOTE — Assessment & Plan Note (Signed)
 Stable.  Continue current medication

## 2023-11-29 NOTE — Assessment & Plan Note (Signed)
Stable. Lipid panel today.  Continue Crestor.

## 2023-11-29 NOTE — Patient Instructions (Signed)
 Continue your medications.  Follow up in 6 months.  Take care  Dr. Adriana Simas

## 2023-11-30 LAB — CBC
Hematocrit: 48 % (ref 37.5–51.0)
Hemoglobin: 15.7 g/dL (ref 13.0–17.7)
MCH: 29.9 pg (ref 26.6–33.0)
MCHC: 32.7 g/dL (ref 31.5–35.7)
MCV: 91 fL (ref 79–97)
Platelets: 233 10*3/uL (ref 150–450)
RBC: 5.25 x10E6/uL (ref 4.14–5.80)
RDW: 13.4 % (ref 11.6–15.4)
WBC: 7.9 10*3/uL (ref 3.4–10.8)

## 2023-11-30 LAB — LIPID PANEL
Chol/HDL Ratio: 2.7 ratio (ref 0.0–5.0)
Cholesterol, Total: 139 mg/dL (ref 100–199)
HDL: 51 mg/dL (ref >39)
LDL Chol Calc (NIH): 67 mg/dL (ref 0–99)
Triglycerides: 119 mg/dL (ref 0–149)
VLDL Cholesterol Cal: 21 mg/dL (ref 5–40)

## 2023-11-30 LAB — CMP14+EGFR
ALT: 23 IU/L (ref 0–44)
AST: 29 IU/L (ref 0–40)
Albumin: 4.7 g/dL (ref 3.9–4.9)
Alkaline Phosphatase: 71 IU/L (ref 44–121)
BUN/Creatinine Ratio: 16 (ref 10–24)
BUN: 15 mg/dL (ref 8–27)
Bilirubin Total: 0.4 mg/dL (ref 0.0–1.2)
CO2: 22 mmol/L (ref 20–29)
Calcium: 9.5 mg/dL (ref 8.6–10.2)
Chloride: 102 mmol/L (ref 96–106)
Creatinine, Ser: 0.96 mg/dL (ref 0.76–1.27)
Globulin, Total: 2.6 g/dL (ref 1.5–4.5)
Glucose: 105 mg/dL — ABNORMAL HIGH (ref 70–99)
Potassium: 4.7 mmol/L (ref 3.5–5.2)
Sodium: 140 mmol/L (ref 134–144)
Total Protein: 7.3 g/dL (ref 6.0–8.5)
eGFR: 87 mL/min/{1.73_m2} (ref >59)

## 2023-11-30 LAB — MICROALBUMIN / CREATININE URINE RATIO
Creatinine, Urine: 149.9 mg/dL
Microalb/Creat Ratio: 9 mg/g{creat} (ref 0–29)
Microalbumin, Urine: 13.9 ug/mL

## 2023-11-30 LAB — PSA: Prostate Specific Ag, Serum: 0.7 ng/mL (ref 0.0–4.0)

## 2023-11-30 LAB — HEMOGLOBIN A1C
Est. average glucose Bld gHb Est-mCnc: 120 mg/dL
Hgb A1c MFr Bld: 5.8 % — ABNORMAL HIGH (ref 4.8–5.6)

## 2023-12-02 ENCOUNTER — Ambulatory Visit: Payer: Self-pay | Admitting: Family Medicine

## 2024-01-01 ENCOUNTER — Other Ambulatory Visit: Payer: Self-pay | Admitting: Family Medicine

## 2024-01-01 DIAGNOSIS — E785 Hyperlipidemia, unspecified: Secondary | ICD-10-CM

## 2024-01-01 DIAGNOSIS — K219 Gastro-esophageal reflux disease without esophagitis: Secondary | ICD-10-CM

## 2024-01-08 ENCOUNTER — Other Ambulatory Visit: Payer: Self-pay | Admitting: Family Medicine

## 2024-01-08 DIAGNOSIS — I10 Essential (primary) hypertension: Secondary | ICD-10-CM

## 2024-02-07 ENCOUNTER — Ambulatory Visit (INDEPENDENT_AMBULATORY_CARE_PROVIDER_SITE_OTHER)

## 2024-02-07 ENCOUNTER — Ambulatory Visit (INDEPENDENT_AMBULATORY_CARE_PROVIDER_SITE_OTHER): Admitting: Podiatry

## 2024-02-07 DIAGNOSIS — M2042 Other hammer toe(s) (acquired), left foot: Secondary | ICD-10-CM

## 2024-02-07 DIAGNOSIS — B070 Plantar wart: Secondary | ICD-10-CM

## 2024-02-07 NOTE — Patient Instructions (Signed)
 Take dressing off in 8 hours and wash the foot with soap and water. If it is hurting or becomes uncomfortable before the 8 hours, go ahead and remove the bandage and wash the area.  If it blisters, apply antibiotic ointment and a band-aid.  Monitor for any signs/symptoms of infection. Call the office immediately if any occur or go directly to the emergency room. Call with any questions/concerns.

## 2024-02-07 NOTE — Progress Notes (Signed)
 Subjective:   Patient ID: Billy Mccormick, male   DOB: 68 y.o.   MRN: [id number]   HPI Chief Complaint  Patient presents with   Billy Mccormick    Rm11 Patient complains of hammer toe left 5th/ several years/burning and stinging hurts with pressure   68 year old male presents the office with above concerns.  He states that most of his tenderness is along the left fifth toe pointing on the PIPJ.  He is previously had injections and other treatments which he states are ineffective.  He does not report any open lesions.  He said this started in September 2023.  The symptoms improved in the winter, he is not as active.   Review of Systems  All other systems reviewed and are negative.  Past Medical History:  Diagnosis Date   Degenerative cervical disc    Essential hypertension 10/02/2015   Hyperlipidemia    IFG (impaired fasting glucose)    Lumbar herniated disc 2015   L4,L5   Psoriasis     Past Surgical History:  Procedure Laterality Date   ABDOMINAL AORTIC ENDOVASCULAR STENT GRAFT N/A 04/28/2020   Procedure: ABDOMINAL AORTIC ENDOVASCULAR STENT GRAFT;  Surgeon: Oris Krystal FALCON, MD;  Location: [facility] OR;  Service: Vascular;  Laterality: N/A;   ACHILLES TENDON REPAIR     right   COLONOSCOPY  06/25/2006   Dr. Shaaron: extensive left-sided diverticula, 2 six to seven mm pale-appearing hyperplastic-appearing polypoid lesions vs atypcial appearing lipoma. (bx-hyperplastic polyps). next TCS 06/2016   COLONOSCOPY N/A 06/21/2017   Procedure: COLONOSCOPY;  Surgeon: Shaaron Lamar HERO, MD;  [facility];  Service: Endoscopy;  Laterality: N/A;  9:45 AM   ESOPHAGOGASTRODUODENOSCOPY N/A 11/28/2013   MFM:Wnwrmpuprjo Schatzki's ring  not manipulated. Small hiatal hernia. Gastric ulcer and erosions as above. Bx antrum-extensive intestinal metaplasia, no H.pylori   ESOPHAGOGASTRODUODENOSCOPY N/A 05/26/2014   Procedure: ESOPHAGOGASTRODUODENOSCOPY (EGD);  Surgeon: Lamar HERO Shaaron, MD;  [facility];  Service: Endoscopy;  Laterality: N/A;  7:30AM   HERNIA REPAIR     x 2   MALONEY DILATION N/A 11/28/2013   Procedure: MALONEY DILATION;  Surgeon: Lamar HERO Shaaron, MD;  [facility];  Service: Endoscopy;  Laterality: N/A;   NOSE SURGERY     x 2   POLYPECTOMY  06/21/2017   Procedure: POLYPECTOMY;  Surgeon: Shaaron Lamar HERO, MD;  [facility];  Service: Endoscopy;;  colon   SAVORY DILATION N/A 11/28/2013   Procedure: SAVORY DILATION;  Surgeon: Lamar HERO Shaaron, MD;  [facility];  Service: Endoscopy;  Laterality: N/A;   ULTRASOUND GUIDANCE FOR VASCULAR ACCESS Bilateral 04/28/2020   Procedure: ULTRASOUND GUIDANCE FOR VASCULAR ACCESS;  Surgeon: Oris Krystal FALCON, MD;  Location: [facility] OR;  Service: Vascular;  Laterality: Bilateral;     Current Outpatient Medications:    amLODipine  (NORVASC ) 5 MG tablet, TAKE 1 TABLET(5 MG) BY MOUTH DAILY, Disp: 90 tablet, Rfl: 3   aspirin  EC 81 MG tablet, Take 81 mg by mouth daily. Swallow whole. , Disp: , Rfl:    enalapril  (VASOTEC ) 10 MG tablet, TAKE 1 TABLET(10 MG) BY MOUTH TWICE DAILY, Disp: 180 tablet, Rfl: 1   pantoprazole  (PROTONIX ) 40 MG tablet, TAKE 1 TABLET BY MOUTH EVERY DAY, Disp: 90 tablet, Rfl: 3   rosuvastatin  (CRESTOR ) 20 MG tablet, TAKE 1 TABLET BY MOUTH AT BEDTIME, Disp: 90 tablet, Rfl: 3  Allergies  Allergen Reactions   Prednisone Anxiety  Objective:  Physical Exam  General: AAO x3, NAD  Dermatological: Skin is warm, dry and supple bilateral. There are no open sores, no preulcerative lesions, no rash or signs of infection present.  Annular hyperkeratotic lesion with pinpoint bleeding with evidence of verruca noted on left hallux.  No underlying ulceration or signs of infection.  Vascular: Dorsalis Pedis artery and Posterior Tibial artery pedal pulses are 2/4 bilateral with immedate capillary fill time.  There is no pain with calf compression, swelling, warmth, erythema.   Neruologic: Grossly  intact via light touch bilateral.   Musculoskeletal: Adductovarus deformities noted left fifth toe.  He gets tenderness PIPJ.  Slight erythema from irritation but there is no skin breakdown or warmth or any other signs of infection.  I also described a nerve type symptoms for the area.  Gait: Unassisted, Nonantalgic.       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  Multiple views of the foot were obtained.  No acute fracture.  Digital deformities are present. -We discussed the conservative as well as surgical treatment options.  Described them.  Patient was contacted with PIPJ arthroplasty.  If he were to proceed with this would likely.  In the wintertime.  Conservatively prescription application, offloading padding.  I dispensed offloading gel silicone toe cap.  Inflammatories as needed.  Plantar wart - Debrided hyperkeratotic lesion today.  Cantharone was applied followed by an occlusive bandage.  Postprocedure directions discussed.  Monitor for any signs or symptoms of infection.  Return if symptoms worsen or fail to improve.  Donnice JONELLE Fees DPM

## 2024-02-20 ENCOUNTER — Ambulatory Visit (HOSPITAL_COMMUNITY)
Admission: RE | Admit: 2024-02-20 | Discharge: 2024-02-20 | Disposition: A | Discharge status: Home / Self Care | Source: Ambulatory Visit | Attending: Acute Care | Admitting: Acute Care

## 2024-02-20 DIAGNOSIS — Z122 Encounter for screening for malignant neoplasm of respiratory organs: Secondary | ICD-10-CM | POA: Diagnosis present

## 2024-02-20 DIAGNOSIS — Z87891 Personal history of nicotine dependence: Secondary | ICD-10-CM | POA: Diagnosis present

## 2024-02-20 DIAGNOSIS — F17210 Nicotine dependence, cigarettes, uncomplicated: Secondary | ICD-10-CM | POA: Diagnosis present

## 2024-03-05 ENCOUNTER — Other Ambulatory Visit: Payer: Self-pay

## 2024-03-05 DIAGNOSIS — F17210 Nicotine dependence, cigarettes, uncomplicated: Secondary | ICD-10-CM

## 2024-03-05 DIAGNOSIS — Z87891 Personal history of nicotine dependence: Secondary | ICD-10-CM

## 2024-03-05 DIAGNOSIS — Z122 Encounter for screening for malignant neoplasm of respiratory organs: Secondary | ICD-10-CM

## 2024-04-09 ENCOUNTER — Ambulatory Visit: Admitting: Family Medicine

## 2024-04-18 ENCOUNTER — Ambulatory Visit

## 2024-04-18 VITALS — Ht 73.0 in | Wt 185.0 lb

## 2024-04-18 DIAGNOSIS — Z0000 Encounter for general adult medical examination without abnormal findings: Secondary | ICD-10-CM | POA: Diagnosis not present

## 2024-04-18 NOTE — Progress Notes (Signed)
 Subjective:   Billy Mccormick is a 68 y.o. who presents for a Medicare Wellness preventive visit.  As a reminder, Annual Wellness Visits don't include a physical exam, and some assessments may be limited, especially if this visit is performed virtually. We may recommend an in-person follow-up visit with your provider if needed.  Visit Complete: Virtual I connected with  Billy Mccormick on 04/18/24 by a audio enabled telemedicine application and verified that I am speaking with the correct person using two identifiers.  Patient Location: Home  Provider Location: Home Office  I discussed the limitations of evaluation and management by telemedicine. The patient expressed understanding and agreed to proceed.  Vital Signs: Because this visit was a virtual/telehealth visit, some criteria may be missing or patient reported. Any vitals not documented were not able to be obtained and vitals that have been documented are patient reported.  VideoDeclined- This patient declined Librarian, academic. Therefore the visit was completed with audio only.  Persons Participating in Visit: Patient.  AWV Questionnaire: Yes: Patient Medicare AWV questionnaire was completed by the patient on 04/15/2024; I have confirmed that all information answered by patient is correct and no changes since this date.  Cardiac Risk Factors include: advanced age (>65men, >75 women);dyslipidemia;male gender     Objective:    Today's Vitals   04/18/24 1148  Weight: 185 lb (83.9 kg)  Height: 6' 1 (1.854 m)   Body mass index is 24.41 kg/m.     04/18/2024   11:49 AM 04/21/2020    9:14 AM 02/21/2019    8:22 AM 06/21/2017    8:56 AM 10/26/2016    8:48 AM 05/02/2016    4:44 PM 04/17/2016    4:49 PM  Advanced Directives  Does Patient Have a Medical Advance Directive? No Yes Yes Yes  Yes  Yes  Yes   Type of Furniture conservator/restorer;Living will Healthcare Power of  [place];Living will Healthcare Power of [place];Living will Healthcare Power of [place];Living will Healthcare Power of [place];Living will  Healthcare Power of [place];Living will   Does patient want to make changes to medical advance directive?   No - Patient declined    No - Patient declined  No - Patient declined   Copy of Healthcare Power of Attorney in Chart?  No - copy requested No - copy requested  No - copy requested  No - copy requested  No - copy requested  No - copy requested   Would patient like information on creating a medical advance directive? No - Patient declined           Data saved with a previous flowsheet row definition    Current Medications (verified) Outpatient Encounter Medications as of 04/18/2024  Medication Sig   amLODipine  (NORVASC ) 5 MG tablet TAKE 1 TABLET(5 MG) BY MOUTH DAILY   aspirin  EC 81 MG tablet Take 81 mg by mouth daily. Swallow whole.    enalapril  (VASOTEC ) 10 MG tablet TAKE 1 TABLET(10 MG) BY MOUTH TWICE DAILY   pantoprazole  (PROTONIX ) 40 MG tablet TAKE 1 TABLET BY MOUTH EVERY DAY   rosuvastatin  (CRESTOR ) 20 MG tablet TAKE 1 TABLET BY MOUTH AT BEDTIME   No facility-administered encounter medications on file as of 04/18/2024.    Allergies (verified) Prednisone   History: Past Medical History:  Diagnosis Date   Allergy    Degenerative cervical disc    Essential hypertension 10/02/2015   GERD (gastroesophageal reflux disease)  Hyperlipidemia    IFG (impaired fasting glucose)    Lumbar herniated disc 2015   L4,L5   Psoriasis    Past Surgical History:  Procedure Laterality Date   ABDOMINAL AORTIC ENDOVASCULAR STENT GRAFT N/A 04/28/2020   Procedure: ABDOMINAL AORTIC ENDOVASCULAR STENT GRAFT;  Surgeon: Oris Krystal FALCON, MD;  Location: [facility] OR;  Service: Vascular;  Laterality: N/A;   ACHILLES TENDON REPAIR     right   COLONOSCOPY  06/25/2006   Dr. Shaaron: extensive left-sided diverticula, 2 six to seven mm pale-appearing  hyperplastic-appearing polypoid lesions vs atypcial appearing lipoma. (bx-hyperplastic polyps). next TCS 06/2016   COLONOSCOPY N/A 06/21/2017   Procedure: COLONOSCOPY;  Surgeon: Shaaron Lamar HERO, MD;  Location: [facility];  Service: Endoscopy;  Laterality: N/A;  9:45 AM   ESOPHAGOGASTRODUODENOSCOPY N/A 11/28/2013   MFM:Wnwrmpuprjo Schatzki's ring  not manipulated. Small hiatal hernia. Gastric ulcer and erosions as above. Bx antrum-extensive intestinal metaplasia, no H.pylori   ESOPHAGOGASTRODUODENOSCOPY N/A 05/26/2014   Procedure: ESOPHAGOGASTRODUODENOSCOPY (EGD);  Surgeon: Lamar HERO Shaaron, MD;  Location: [facility];  Service: Endoscopy;  Laterality: N/A;  7:30AM   HERNIA REPAIR     x 2   MALONEY DILATION N/A 11/28/2013   Procedure: MALONEY DILATION;  Surgeon: Lamar HERO Shaaron, MD;  Location: [facility];  Service: Endoscopy;  Laterality: N/A;   NOSE SURGERY     x 2   POLYPECTOMY  06/21/2017   Procedure: POLYPECTOMY;  Surgeon: Shaaron Lamar HERO, MD;  Location: [facility];  Service: Endoscopy;;  colon   SAVORY DILATION N/A 11/28/2013   Procedure: SAVORY DILATION;  Surgeon: Lamar HERO Shaaron, MD;  Location: [facility];  Service: Endoscopy;  Laterality: N/A;   ULTRASOUND GUIDANCE FOR VASCULAR ACCESS Bilateral 04/28/2020   Procedure: ULTRASOUND GUIDANCE FOR VASCULAR ACCESS;  Surgeon: Oris Krystal FALCON, MD;  Location: [facility] OR;  Service: Vascular;  Laterality: Bilateral;   Family History  Problem Relation Age of Onset   Hyperlipidemia Mother    Anxiety disorder Mother    Hyperlipidemia Father    Kidney disease Brother    Colon cancer Neg Hx    Social History   Socioeconomic History   Marital status: Married    Spouse name: Not on file   Number of children: 0   Years of education: Not on file   Highest education level: Associate degree: occupational, Scientist, product/process development, or vocational program  Occupational History   Not on file  Tobacco Use   Smoking status: Every Day    Current packs/day: 0.50     Average packs/day: 0.7 packs/day for 52.5 years (35.0 ttl pk-yrs)    Types: Cigarettes   Smokeless tobacco: Never   Tobacco comments:    8 cigarettes per day  Vaping Use   Vaping status: Never Used  Substance and Sexual Activity   Alcohol use: Yes    Alcohol/week: 21.0 standard drinks of alcohol    Types: 21 Shots of liquor per week    Comment: 6 beers about 3 days weekly   Drug use: No   Sexual activity: Yes    Birth control/protection: None  Other Topics Concern   Not on file  Social History Narrative   Not on file   Social Drivers of Health   Financial Resource Strain: Low Risk  (04/15/2024)   Overall Financial Resource Strain (CARDIA)    Difficulty of Paying Living Expenses: Not hard at all  Food Insecurity: No Food Insecurity (04/15/2024)   Hunger Vital Sign  Worried About Programme researcher, broadcasting/film/video in the Last Year: Never true    Ran Out of Food in the Last Year: Never true  Transportation Needs: No Transportation Needs (04/15/2024)   PRAPARE - Administrator, Civil Service (Medical): No    Lack of Transportation (Non-Medical): No  Physical Activity: Sufficiently Active (04/15/2024)   Exercise Vital Sign    Days of Exercise per Week: 5 days    Minutes of Exercise per Session: 60 min  Stress: No Stress Concern Present (04/15/2024)   [employer] of Occupational Health - Occupational Stress Questionnaire    Feeling of Stress: Not at all  Social Connections: Moderately Isolated (04/15/2024)   Social Connection and Isolation Panel    Frequency of Communication with Friends and Family: Twice a week    Frequency of Social Gatherings with Friends and Family: Three times a week    Attends Religious Services: Never    Active Member of Clubs or Organizations: No    Attends Banker Meetings: Never    Marital Status: Married    Tobacco Counseling Ready to quit: Yes Counseling given: Yes Tobacco comments: 8 cigarettes per day    Clinical  Intake:  Pre-visit preparation completed: Yes  Pain : No/denies pain     BMI - recorded: 24.41 Nutritional Status: BMI of 19-24  Normal Nutritional Risks: None Diabetes: No  Lab Results  Component Value Date   HGBA1C 5.8 (H) 11/29/2023   HGBA1C 5.8 (H) 10/23/2022   HGBA1C 5.7 (H) 07/27/2021     How often do you need to have someone help you when you read instructions, pamphlets, or other written materials from your doctor or pharmacy?: 1 - Never  Interpreter Needed?: No  Information entered by :: Elynore Dolinski W CMA (AAMA)   Activities of Daily Living     04/15/2024   12:50 PM  In your present state of health, do you have any difficulty performing the following activities:  Hearing? 0  Vision? 0  Difficulty concentrating or making decisions? 0  Walking or climbing stairs? 0  Dressing or bathing? 0  Doing errands, shopping? 0  Preparing Food and eating ? N  Using the Toilet? N  In the past six months, have you accidently leaked urine? N  Do you have problems with loss of bowel control? N  Managing your Medications? N  Managing your Finances? N  Housekeeping or managing your Housekeeping? N    Patient Care Team: Cook, Jayce G, DO as PCP - General (Family Medicine) Shaaron Lamar HERO, MD as Consulting Physician (Gastroenterology) Spainhour, Alm RAMAN, PA as Physician Assistant (Otolaryngology) Hollace Richerd CROME, LMFT (Behavioral Health) Janit Thresa HERO, DPM as Consulting Physician (Podiatry)  I have updated your Care Teams any recent Medical Services you may have received from other providers in the past year.     Assessment:   This is a routine wellness examination for Billy Mccormick.  Hearing/Vision screen Hearing Screening - Comments:: Patient denies any hearing difficulties.   Vision Screening - Comments:: Patient does not have an eye doctor. A list of eye doctors has been provided to the patient.     Goals Addressed               This Visit's Progress     Remain  active and healthy (pt-stated)          Depression Screen     04/18/2024   11:40 AM 11/29/2023    9:25 AM 05/30/2023  9:33 AM 10/23/2022    8:46 AM 05/02/2022    9:06 AM 01/18/2022    8:47 AM 07/21/2021    8:38 AM  PHQ 2/9 Scores  PHQ - 2 Score 0 0 1 0 0 0 0  PHQ- 9 Score 0 0 1 0        Fall Risk     04/15/2024   12:50 PM 11/29/2023    9:25 AM 05/30/2023    9:33 AM 10/23/2022    8:46 AM 05/02/2022    9:06 AM  Fall Risk   Falls in the past year? 1 0 0 0 0  Number falls in past yr: 0   0 0  Injury with Fall? 0   0 0  Risk for fall due to : No Fall Risks   No Fall Risks No Fall Risks  Follow up Falls evaluation completed;Education provided;Falls prevention discussed   Falls evaluation completed Falls evaluation completed      Data saved with a previous flowsheet row definition    MEDICARE RISK AT HOME:  Medicare Risk at Home Any stairs in or around the home?: (Patient-Rptd) Yes If so, are there any without handrails?: (Patient-Rptd) Yes Home free of loose throw rugs in walkways, pet beds, electrical cords, etc?: (Patient-Rptd) Yes Adequate lighting in your home to reduce risk of falls?: (Patient-Rptd) Yes Life alert?: (Patient-Rptd) No Use of a cane, walker or w/c?: (Patient-Rptd) No Grab bars in the bathroom?: (Patient-Rptd) No Shower chair or bench in shower?: (Patient-Rptd) Yes Elevated toilet seat or a handicapped toilet?: (Patient-Rptd) No  TIMED UP AND GO:  Was the test performed?  No  Cognitive Function: 6CIT completed        04/18/2024   11:39 AM  6CIT Screen  What Year? 0 points  What month? 0 points  What time? 0 points  Count back from 20 0 points  Months in reverse 0 points  Repeat phrase 0 points  Total Score 0 points    Immunizations Immunization History  Administered Date(s) Administered   Fluad Quad(high Dose 65+) 05/02/2022   Fluad Trivalent(High Dose 65+) 05/30/2023   Janssen (J&J) SARS-COV-2 Vaccination 09/29/2019   PNEUMOCOCCAL  CONJUGATE-20 05/30/2023   Pneumococcal Polysaccharide-23 08/17/2012   Td 06/16/2005   Tdap 02/22/2018    Screening Tests Health Maintenance  Topic Date Due   Zoster Vaccines- Shingrix (1 of 2) Never done   Influenza Vaccine  02/15/2024   Lung Cancer Screening  02/19/2025   Medicare Annual Wellness (AWV)  04/18/2025   Colonoscopy  06/22/2027   DTaP/Tdap/Td (3 - Td or Tdap) 02/23/2028   Pneumococcal Vaccine: 50+ Years  Completed   HPV VACCINES  Aged Out   Meningococcal B Vaccine  Aged Out   COVID-19 Vaccine  Discontinued   Hepatitis C Screening  Discontinued    Health Maintenance Health Maintenance Due  Topic Date Due   Zoster Vaccines- Shingrix (1 of 2) Never done   Influenza Vaccine  02/15/2024   Health Maintenance Items Addressed: Routine health screenings are up to date. Patient is aware of recommended vaccines.   Additional Screening:  Vision Screening: Recommended annual ophthalmology exams for early detection of glaucoma and other disorders of the eye. Would you like a referral to an eye doctor?  Patient does not have an eye doctor. A list of eye doctors has been provided to the patient.   Dental Screening: Recommended annual dental exams for proper oral hygiene  Community Resource Referral / Chronic Care Management: CRR required  this visit?  No   CCM required this visit?  No   Plan:    I have personally reviewed and noted the following in the patient's chart:   Medical and social history Use of alcohol, tobacco or illicit drugs  Current medications and supplements including opioid prescriptions. Patient is not currently taking opioid prescriptions. Functional ability and status Nutritional status Physical activity Advanced directives List of other physicians Hospitalizations, surgeries, and ER visits in previous 12 months Vitals Screenings to include cognitive, depression, and falls Referrals and appointments  In addition, I have reviewed and  discussed with patient certain preventive protocols, quality metrics, and best practice recommendations. A written personalized care plan for preventive services as well as general preventive health recommendations were provided to patient.   Terrianna Holsclaw, CMA   04/18/2024   After Visit Summary: (MyChart) Due to this being a telephonic visit, the after visit summary with patients personalized plan was offered to patient via MyChart   Notes: Nothing significant to report at this time.

## 2024-04-18 NOTE — Patient Instructions (Signed)
 Billy Mccormick,  Thank you for taking the time for your Medicare Wellness Visit. I appreciate your continued commitment to your health goals. Please review the care plan we discussed, and feel free to reach out if I can assist you further.  Medicare recommends these wellness visits once per year to help you and your care team stay ahead of potential health issues. These visits are designed to focus on prevention, allowing your provider to concentrate on managing your acute and chronic conditions during your regular appointments.  Please note that Annual Wellness Visits do not include a physical exam. Some assessments may be limited, especially if the visit was conducted virtually. If needed, we may recommend a separate in-person follow-up with your provider.  Wishing you excellent health and many blessings in the year to come!  -Jemimah Cressy, CMA  Ongoing Care Seeing your primary care provider every 3 to 6 months helps us  monitor your health and provide consistent, personalized care.   Recommended Screenings:  Health Maintenance  Topic Date Due   Zoster (Shingles) Vaccine (1 of 2) Never done   Flu Shot  02/15/2024   Screening for Lung Cancer  02/19/2025   Medicare Annual Wellness Visit  04/18/2025   Colon Cancer Screening  06/22/2027   DTaP/Tdap/Td vaccine (3 - Td or Tdap) 02/23/2028   Pneumococcal Vaccine for age over 94  Completed   HPV Vaccine  Aged Out   Meningitis B Vaccine  Aged Out   COVID-19 Vaccine  Discontinued   Hepatitis C Screening  Discontinued       04/18/2024   11:49 AM  Advanced Directives  Does Patient Have a Medical Advance Directive? No  Would patient like information on creating a medical advance directive? No - Patient declined   Advance Care Planning is important because it: Ensures you receive medical care that aligns with your values, goals, and preferences. Provides guidance to your family and loved ones, reducing the emotional burden of decision-making during  critical moments.  Vision: Annual vision screenings are recommended for early detection of glaucoma, cataracts, and diabetic retinopathy. These exams can also reveal signs of chronic conditions such as diabetes and high blood pressure.  Dental: Annual dental screenings help detect early signs of oral cancer, gum disease, and other conditions linked to overall health, including heart disease and diabetes.  Please see the attached documents for additional preventive care recommendations.

## 2024-05-08 ENCOUNTER — Ambulatory Visit: Admitting: Podiatry

## 2024-05-23 ENCOUNTER — Ambulatory Visit (INDEPENDENT_AMBULATORY_CARE_PROVIDER_SITE_OTHER): Admitting: Podiatry

## 2024-05-23 ENCOUNTER — Encounter: Payer: Self-pay | Admitting: Podiatry

## 2024-05-23 DIAGNOSIS — D2371 Other benign neoplasm of skin of right lower limb, including hip: Secondary | ICD-10-CM

## 2024-05-23 NOTE — Progress Notes (Signed)
 Subjective:   Patient ID: Billy Mccormick, male   DOB: 68 y.o.   MRN: [id number]   HPI Chief Complaint  Patient presents with   Plantar Warts    F/U plantars wart left foot sub met 1. 2 pain. Non diabetic. Salicylic acid treatment.    68 year old male presents the office with above concerns.  He states that the area is still is causing discomfort.  He did have some sensitivity for couple of days after the [place] outpatient last appointment but otherwise that resolved.  He does not report any recent injuries or stepping any foreign objects.  No drainage or pus.        Objective:  Physical Exam  General: AAO x3, NAD  Dermatological: Annular hyperkeratotic lesion with pinpoint bleeding with evidence of verruca noted on left hallux approximately just distal to the metatarsal head.  No underlying ulceration or signs of infection.  Vascular: Dorsalis Pedis artery and Posterior Tibial artery pedal pulses are 2/4 bilateral with immedate capillary fill time.  There is no pain with calf compression, swelling, warmth, erythema.   Neruologic: Grossly intact via light touch bilateral.   Musculoskeletal: There is no edema, erythema or signs of infection.  There is no open lesions otherwise.  No evidence of foreign body.   Gait: Unassisted, Nonantalgic.       Assessment:   68 year old male with skin lesion, likely plantar wart     Plan:  -Treatment options discussed including all alternatives, risks, and complications -Etiology of symptoms were discussed -Debrided lesion with any complications or bleeding.  Cleaned area with alcohol.  Cleaned the skin and a pad was placed followed by salicylic acid and a bandage.  Postprocedure instructions discussed.  Monitor for any signs or symptoms of infection -I ordered a compound cream through [facility] that he can use on a daily basis as well. - Discussed excision, biopsy if symptoms persist  No follow-ups on file.  Donnice JONELLE Fees  DPM

## 2024-05-27 ENCOUNTER — Encounter: Payer: Self-pay | Admitting: Podiatry

## 2024-05-30 ENCOUNTER — Ambulatory Visit: Admitting: Family Medicine

## 2024-05-30 VITALS — BP 157/94 | HR 76 | Temp 97.7°F | Ht 73.0 in | Wt 188.4 lb

## 2024-05-30 DIAGNOSIS — E785 Hyperlipidemia, unspecified: Secondary | ICD-10-CM

## 2024-05-30 DIAGNOSIS — K219 Gastro-esophageal reflux disease without esophagitis: Secondary | ICD-10-CM | POA: Diagnosis not present

## 2024-05-30 DIAGNOSIS — Z720 Tobacco use: Secondary | ICD-10-CM

## 2024-05-30 DIAGNOSIS — Z23 Encounter for immunization: Secondary | ICD-10-CM

## 2024-05-30 DIAGNOSIS — I10 Essential (primary) hypertension: Secondary | ICD-10-CM

## 2024-05-30 NOTE — Patient Instructions (Signed)
 Continue your medications.  Follow up in 6 months.  Take care  Dr. Adriana Simas

## 2024-06-01 MED ORDER — ENALAPRIL MALEATE 10 MG PO TABS: 10.0000 mg | ORAL_TABLET | Freq: Two times a day (BID) | ORAL | 3 refills | Status: AC

## 2024-06-01 MED ORDER — PANTOPRAZOLE SODIUM 40 MG PO TBEC: 40.0000 mg | DELAYED_RELEASE_TABLET | Freq: Every day | ORAL | 3 refills | Status: AC

## 2024-06-01 MED ORDER — ROSUVASTATIN CALCIUM 20 MG PO TABS: 20.0000 mg | ORAL_TABLET | Freq: Every day | ORAL | 3 refills | Status: AC

## 2024-06-01 MED ORDER — AMLODIPINE BESYLATE 5 MG PO TABS: ORAL_TABLET | ORAL | 3 refills | Status: AC

## 2024-06-01 NOTE — Progress Notes (Signed)
 Subjective:  Patient ID: Billy Mccormick, male    DOB: Jul 21, 1955  Age: 68 y.o. MRN: [id number]  CC:   Chief Complaint  Patient presents with   Follow-up    Patient is here for a 6 month follow up with Dr. Bluford    HPI:  68 year old male presents for follow up.  Feeling well. Continues to smoke. Not yet ready to quit.  BP elevated here today. He is compliant with Enalapril  and Norvasc .  LDL at goal on Crestor .  Needs flu shot. Amendable to getting today.   Patient Active Problem List   Diagnosis Date Noted   CAD (coronary artery disease) 05/30/2023   COPD (chronic obstructive pulmonary disease) (HCC) 05/30/2023   Hyperlipidemia 10/23/2022   Tobacco abuse 10/23/2022   History of gastric ulcer 10/23/2022   Prediabetes 05/02/2022   AAA (abdominal aortic aneurysm) 04/28/2020   Essential hypertension 10/02/2015   GERD (gastroesophageal reflux disease) 04/27/2014    Social Hx   Social History   Socioeconomic History   Marital status: Married    Spouse name: Not on file   Number of children: 0   Years of education: Not on file   Highest education level: Associate degree: occupational, scientist, product/process development, or vocational program  Occupational History   Not on file  Tobacco Use   Smoking status: Every Day    Current packs/day: 0.50    Average packs/day: 0.7 packs/day for 52.5 years (35.0 ttl pk-yrs)    Types: Cigarettes   Smokeless tobacco: Never   Tobacco comments:    8 cigarettes per day  Vaping Use   Vaping status: Never Used  Substance and Sexual Activity   Alcohol use: Yes    Alcohol/week: 21.0 standard drinks of alcohol    Types: 21 Shots of liquor per week    Comment: 6 beers about 3 days weekly   Drug use: No   Sexual activity: Yes    Birth control/protection: None  Other Topics Concern   Not on file  Social History Narrative   Not on file   Social Drivers of Health   Financial Resource Strain: Low Risk  (05/27/2024)   Overall Financial Resource Strain  (CARDIA)    Difficulty of Paying Living Expenses: Not hard at all  Food Insecurity: No Food Insecurity (05/27/2024)   Hunger Vital Sign    Worried About Running Out of Food in the Last Year: Never true    Ran Out of Food in the Last Year: Never true  Transportation Needs: No Transportation Needs (05/27/2024)   PRAPARE - Administrator, Civil Service (Medical): No    Lack of Transportation (Non-Medical): No  Physical Activity: Sufficiently Active (05/27/2024)   Exercise Vital Sign    Days of Exercise per Week: 4 days    Minutes of Exercise per Session: 120 min  Stress: No Stress Concern Present (05/27/2024)   [employer] of Occupational Health - Occupational Stress Questionnaire    Feeling of Stress: Not at all  Social Connections: Moderately Isolated (05/27/2024)   Social Connection and Isolation Panel    Frequency of Communication with Friends and Family: Three times a week    Frequency of Social Gatherings with Friends and Family: Twice a week    Attends Religious Services: Patient declined    Database Administrator or Organizations: No    Attends Engineer, Structural: Not on file    Marital Status: Married    Review of Systems Per  HPI  Objective:  BP (!) 157/94 (BP Location: Left Arm, Patient Position: Sitting)   Pulse 76   Temp 97.7 F (36.5 C)   Ht 6' 1 (1.854 m)   Wt 188 lb 6 oz (85.4 kg)   BMI 24.85 kg/m      05/30/2024    8:55 AM 04/18/2024   11:48 AM 11/29/2023    8:33 AM  BP/Weight  Systolic BP 157 -- 146  Diastolic BP 94 -- 84  Wt. (Lbs) 188.38 185 184  BMI 24.85 kg/m2 24.41 kg/m2 24.95 kg/m2    Physical Exam Vitals and nursing note reviewed.  Constitutional:      General: He is not in acute distress.    Appearance: Normal appearance.  HENT:     Head: Normocephalic and atraumatic.  Eyes:     General:        Right eye: No discharge.        Left eye: No discharge.     Conjunctiva/sclera: Conjunctivae normal.   Cardiovascular:     Rate and Rhythm: Normal rate and regular rhythm.  Pulmonary:     Effort: Pulmonary effort is normal.     Breath sounds: Normal breath sounds. No wheezing, rhonchi or rales.  Neurological:     Mental Status: He is alert.  Psychiatric:        Mood and Affect: Mood normal.        Behavior: Behavior normal.     Lab Results  Component Value Date   WBC 7.9 11/29/2023   HGB 15.7 11/29/2023   HCT 48.0 11/29/2023   PLT 233 11/29/2023   GLUCOSE 105 (H) 11/29/2023   CHOL 139 11/29/2023   TRIG 119 11/29/2023   HDL 51 11/29/2023   LDLCALC 67 11/29/2023   ALT 23 11/29/2023   AST 29 11/29/2023   NA 140 11/29/2023   K 4.7 11/29/2023   CL 102 11/29/2023   CREATININE 0.96 11/29/2023   BUN 15 11/29/2023   CO2 22 11/29/2023   PSA 0.71 09/11/2015   INR 1.1 04/28/2020   HGBA1C 5.8 (H) 11/29/2023     Assessment & Plan:  Essential hypertension Assessment & Plan: Advised to monitor BP. Continue current medications.  Orders: -     amLODIPine  Besylate; TAKE 1 TABLET(5 MG) BY MOUTH DAILY  Dispense: 90 tablet; Refill: 3 -     Enalapril  Maleate; Take 1 tablet (10 mg total) by mouth 2 (two) times daily.  Dispense: 180 tablet; Refill: 3  Immunization due -     Flu vaccine HIGH DOSE PF(Fluzone Trivalent)  Hyperlipidemia, unspecified hyperlipidemia type Assessment & Plan: LDL at goal. Continue Crestor .  Orders: -     Rosuvastatin  Calcium ; Take 1 tablet (20 mg total) by mouth at bedtime.  Dispense: 90 tablet; Refill: 3  Gastroesophageal reflux disease without esophagitis -     Pantoprazole  Sodium; Take 1 tablet (40 mg total) by mouth daily.  Dispense: 90 tablet; Refill: 3  Tobacco abuse Assessment & Plan: Recommend smoking cessation.     Follow-up:  6 months  Arnika Larzelere Bluford DO [facility]

## 2024-06-01 NOTE — Assessment & Plan Note (Signed)
 Recommend smoking cessation.

## 2024-06-01 NOTE — Assessment & Plan Note (Signed)
LDL at goal.  Continue Crestor. 

## 2024-06-01 NOTE — Assessment & Plan Note (Signed)
 Advised to monitor BP. Continue current medications.

## 2024-11-27 ENCOUNTER — Ambulatory Visit: Admitting: Family Medicine

## 2025-04-24 ENCOUNTER — Ambulatory Visit
